# Patient Record
Sex: Female | Born: 1965 | Race: White | Hispanic: No | Marital: Married | Smoking: Never smoker
Health system: Southern US, Community
[De-identification: ages and names within clinical notes are randomized; demographics above are authoritative.]

## PROBLEM LIST (undated history)

## (undated) DIAGNOSIS — K219 Gastro-esophageal reflux disease without esophagitis: Secondary | ICD-10-CM

## (undated) DIAGNOSIS — C801 Malignant (primary) neoplasm, unspecified: Secondary | ICD-10-CM

## (undated) DIAGNOSIS — I10 Essential (primary) hypertension: Secondary | ICD-10-CM

## (undated) DIAGNOSIS — E785 Hyperlipidemia, unspecified: Secondary | ICD-10-CM

## (undated) DIAGNOSIS — Z9889 Other specified postprocedural states: Secondary | ICD-10-CM

## (undated) DIAGNOSIS — F419 Anxiety disorder, unspecified: Secondary | ICD-10-CM

## (undated) DIAGNOSIS — J40 Bronchitis, not specified as acute or chronic: Secondary | ICD-10-CM

## (undated) DIAGNOSIS — R112 Nausea with vomiting, unspecified: Secondary | ICD-10-CM

## (undated) DIAGNOSIS — Z8489 Family history of other specified conditions: Secondary | ICD-10-CM

## (undated) DIAGNOSIS — G4730 Sleep apnea, unspecified: Secondary | ICD-10-CM

## (undated) HISTORY — DX: Hyperlipidemia, unspecified: E78.5

## (undated) HISTORY — DX: Gastro-esophageal reflux disease without esophagitis: K21.9

## (undated) HISTORY — DX: Anxiety disorder, unspecified: F41.9

## (undated) HISTORY — DX: Sleep apnea, unspecified: G47.30

## (undated) HISTORY — DX: Essential (primary) hypertension: I10

## (undated) HISTORY — DX: Other specified postprocedural states: Z98.890

## (undated) HISTORY — DX: Bronchitis, not specified as acute or chronic: J40

---

## 2005-05-03 ENCOUNTER — Other Ambulatory Visit: Payer: Self-pay

## 2005-05-03 ENCOUNTER — Emergency Department: Payer: Self-pay | Admitting: Emergency Medicine

## 2005-05-03 ENCOUNTER — Ambulatory Visit: Payer: Self-pay

## 2006-06-26 ENCOUNTER — Ambulatory Visit: Payer: Self-pay

## 2007-08-26 ENCOUNTER — Ambulatory Visit: Payer: Self-pay | Admitting: Internal Medicine

## 2007-10-27 ENCOUNTER — Ambulatory Visit: Payer: Self-pay

## 2007-11-04 ENCOUNTER — Ambulatory Visit: Payer: Self-pay | Admitting: Gastroenterology

## 2007-11-07 ENCOUNTER — Ambulatory Visit: Payer: Self-pay | Admitting: Gastroenterology

## 2008-09-21 ENCOUNTER — Ambulatory Visit: Payer: Self-pay

## 2009-01-27 ENCOUNTER — Ambulatory Visit: Payer: Self-pay | Admitting: Internal Medicine

## 2009-02-08 ENCOUNTER — Ambulatory Visit: Payer: Self-pay | Admitting: Gastroenterology

## 2009-02-17 ENCOUNTER — Ambulatory Visit: Payer: Self-pay | Admitting: Internal Medicine

## 2009-02-25 ENCOUNTER — Ambulatory Visit: Payer: Self-pay | Admitting: Gastroenterology

## 2009-04-26 ENCOUNTER — Ambulatory Visit: Payer: Self-pay | Admitting: Unknown Physician Specialty

## 2009-05-02 ENCOUNTER — Ambulatory Visit: Payer: Self-pay | Admitting: Internal Medicine

## 2009-12-28 ENCOUNTER — Ambulatory Visit: Payer: Self-pay | Admitting: Internal Medicine

## 2010-02-16 ENCOUNTER — Ambulatory Visit: Payer: Self-pay | Admitting: Gastroenterology

## 2010-02-26 LAB — HM PAP SMEAR: HM Pap smear: NORMAL

## 2011-02-28 ENCOUNTER — Ambulatory Visit: Payer: Self-pay | Admitting: Internal Medicine

## 2011-08-29 ENCOUNTER — Encounter: Payer: Self-pay | Admitting: Internal Medicine

## 2011-08-29 ENCOUNTER — Ambulatory Visit (INDEPENDENT_AMBULATORY_CARE_PROVIDER_SITE_OTHER): Payer: BC Managed Care – PPO | Admitting: Internal Medicine

## 2011-08-29 VITALS — BP 158/92 | HR 84 | Temp 98.4°F | Resp 16 | Ht 61.0 in | Wt 198.2 lb

## 2011-08-29 DIAGNOSIS — R928 Other abnormal and inconclusive findings on diagnostic imaging of breast: Secondary | ICD-10-CM

## 2011-08-29 DIAGNOSIS — J45909 Unspecified asthma, uncomplicated: Secondary | ICD-10-CM

## 2011-08-29 DIAGNOSIS — F419 Anxiety disorder, unspecified: Secondary | ICD-10-CM

## 2011-08-29 DIAGNOSIS — I10 Essential (primary) hypertension: Secondary | ICD-10-CM

## 2011-08-29 DIAGNOSIS — R300 Dysuria: Secondary | ICD-10-CM

## 2011-08-29 DIAGNOSIS — E119 Type 2 diabetes mellitus without complications: Secondary | ICD-10-CM

## 2011-08-29 DIAGNOSIS — N63 Unspecified lump in unspecified breast: Secondary | ICD-10-CM

## 2011-08-29 DIAGNOSIS — E785 Hyperlipidemia, unspecified: Secondary | ICD-10-CM

## 2011-08-29 DIAGNOSIS — F411 Generalized anxiety disorder: Secondary | ICD-10-CM

## 2011-08-29 LAB — CBC WITH DIFFERENTIAL/PLATELET
Basophils Relative: 0.4 % (ref 0.0–3.0)
Eosinophils Absolute: 0.1 10*3/uL (ref 0.0–0.7)
Eosinophils Relative: 1.2 % (ref 0.0–5.0)
Hemoglobin: 11.6 g/dL — ABNORMAL LOW (ref 12.0–15.0)
Lymphocytes Relative: 27.6 % (ref 12.0–46.0)
MCHC: 32.9 g/dL (ref 30.0–36.0)
MCV: 79.7 fl (ref 78.0–100.0)
Monocytes Absolute: 0.9 10*3/uL (ref 0.1–1.0)
Neutro Abs: 5.8 10*3/uL (ref 1.4–7.7)
RBC: 4.41 Mil/uL (ref 3.87–5.11)

## 2011-08-29 LAB — COMPREHENSIVE METABOLIC PANEL
AST: 23 U/L (ref 0–37)
Alkaline Phosphatase: 62 U/L (ref 39–117)
BUN: 15 mg/dL (ref 6–23)
Creatinine, Ser: 0.7 mg/dL (ref 0.4–1.2)

## 2011-08-29 LAB — LDL CHOLESTEROL, DIRECT: Direct LDL: 128.1 mg/dL

## 2011-08-29 LAB — LIPID PANEL
Cholesterol: 196 mg/dL (ref 0–200)
HDL: 35.6 mg/dL — ABNORMAL LOW (ref >39.00)
Triglycerides: 220 mg/dL — ABNORMAL HIGH (ref 0.0–149.0)
VLDL: 44 mg/dL — ABNORMAL HIGH (ref 0.0–40.0)

## 2011-08-29 LAB — POCT URINALYSIS DIPSTICK
Glucose, UA: NEGATIVE
Ketones, UA: NEGATIVE
Spec Grav, UA: 1.01
Urobilinogen, UA: 0.2

## 2011-08-29 MED ORDER — NEBIVOLOL HCL 5 MG PO TABS: 5.0000 mg | ORAL_TABLET | Freq: Every day | ORAL | Status: DC

## 2011-08-29 MED ORDER — ALPRAZOLAM 0.25 MG PO TABS: 0.2500 mg | ORAL_TABLET | Freq: Three times a day (TID) | ORAL | Status: DC | PRN

## 2011-08-29 MED ORDER — FLUTICASONE PROPIONATE HFA 110 MCG/ACT IN AERO: 1.0000 | INHALATION_SPRAY | Freq: Two times a day (BID) | RESPIRATORY_TRACT | Status: DC

## 2011-08-29 NOTE — Progress Notes (Signed)
Subjective:    Patient ID: Jessica Houston, female    DOB: 12-15-1965, 45 y.o.   MRN: [id number]  HPI Jessica Houston is a 45 year old female with a history of diabetes and hyperlipidemia who presents for followup. Her primary concern today is a new breast nodule. She first noticed the nodule in her left breast a few days ago. She noticed dimpling in her lateral left breast. She also notes tenderness in both breasts. She denies any skin changes or discharge from her nipples. She reports a recent mammogram that was normal.  In regards to her diabetes, she brings a record of her fasting blood sugar which show most blood sugars between 100 -130. She occasionally checks a postprandial blood sugar and these are typically between 130 and 150. She denies any low blood sugars. She has been using diet alone to control her blood sugars. She was started on metformin in the past but was not able to tolerate this medication.  She reports full compliance with her Bystolic. This has been used in the past for both blood pressure and for palpitations.  She also reports increased anxiety and depressed mood recently. She reports that her job was cut back to part-time and she is constrained financially.  Outpatient Encounter Prescriptions as of 08/29/2011  Medication Sig Dispense Refill  . ALPRAZolam (XANAX) 0.25 MG tablet Take 1 tablet (0.25 mg total) by mouth 3 (three) times daily as needed for anxiety.  90 tablet  3  . Calcium Carbonate Antacid (TUMS PO) Take 1 tablet by mouth as needed.        . Cholecalciferol (VITAMIN D PO) Take 1 tablet by mouth daily.        . Coenzyme Q10 (COQ10 PO) Take 1 tablet by mouth daily.        . fluticasone (FLONASE) 50 MCG/ACT nasal spray Place 2 sprays into the nose as needed.        . fluticasone (FLOVENT HFA) 110 MCG/ACT inhaler Inhale 1 puff into the lungs 2 (two) times daily.  1 Inhaler  6  . loratadine (CLARITIN) 10 MG tablet Take 10 mg by mouth daily as needed.        .  Multiple Vitamin (MULTIVITAMIN) capsule Take 1 capsule by mouth daily.        . nebivolol (BYSTOLIC) 5 MG tablet Take 1 tablet (5 mg total) by mouth daily.  30 tablet  6  . Omega-3 Fatty Acids (FISH OIL) 1000 MG CAPS Take 1 capsule by mouth daily.        . pantoprazole (PROTONIX) 40 MG tablet Take 1 tablet by mouth Daily.        Review of Systems  Constitutional: Negative for fever, chills, appetite change, fatigue and unexpected weight change.  HENT: Negative for ear pain, congestion, sore throat, trouble swallowing, neck pain, voice change and sinus pressure.   Eyes: Negative for visual disturbance.  Respiratory: Negative for cough, shortness of breath, wheezing and stridor.   Cardiovascular: Negative for chest pain, palpitations and leg swelling.  Gastrointestinal: Negative for nausea, vomiting, abdominal pain, diarrhea, constipation, blood in stool, abdominal distention and anal bleeding.  Genitourinary: Negative for dysuria and flank pain.  Musculoskeletal: Negative for myalgias, arthralgias and gait problem.  Skin: Negative for color change and rash.  Neurological: Negative for dizziness and headaches.  Hematological: Negative for adenopathy. Does not bruise/bleed easily.  Psychiatric/Behavioral: Positive for dysphoric mood. Negative for suicidal ideas and sleep disturbance. The patient is nervous/anxious.    BP 158/92  Pulse 84  Temp(Src) 98.4 F (36.9 C) (Oral)  Resp 16  Ht 5\' 1"  (1.549 m)  Wt 198 lb 4 oz (89.926 kg)  BMI 37.46 kg/m2  SpO2 94%  LMP 08/14/2011     Objective:   Physical Exam  Constitutional: She is oriented to person, place, and time. She appears well-developed and well-nourished. No distress.  HENT:  Head: Normocephalic and atraumatic.  Right Ear: External ear normal.  Left Ear: External ear normal.  Nose: Nose normal.  Mouth/Throat: Oropharynx is clear and moist. No oropharyngeal exudate.  Eyes: Conjunctivae are normal. Pupils are equal, round, and  reactive to light. Right eye exhibits no discharge. Left eye exhibits no discharge. No scleral icterus.  Neck: Normal range of motion. Neck supple. No tracheal deviation present. No thyromegaly present.  Cardiovascular: Normal rate, regular rhythm, normal heart sounds and intact distal pulses.  Exam reveals no gallop and no friction rub.   No murmur heard. Pulmonary/Chest: Effort normal and breath sounds normal. No respiratory distress. She has no wheezes. She has no rales. She exhibits no tenderness. Right breast exhibits tenderness. Right breast exhibits no mass and no skin change. Left breast exhibits mass and tenderness. Left breast exhibits no skin change. Breasts are symmetrical.    Musculoskeletal: Normal range of motion. She exhibits no edema and no tenderness.  Lymphadenopathy:    She has no cervical adenopathy.  Neurological: She is alert and oriented to person, place, and time. No cranial nerve deficit. She exhibits normal muscle tone. Coordination normal.  Skin: Skin is warm and dry. No rash noted. She is not diaphoretic. No erythema. No pallor.  Psychiatric: Her behavior is normal. Judgment and thought content normal. Her mood appears anxious. Cognition and memory are normal. She exhibits a depressed mood.          Assessment & Plan:  1. Breast mass - palpable mass at 3:00 in the left breast. Area is firm but movable. Patient reports recent normal mammogram. We will request records on this. Will get ultrasound of her left breast. We also discussed setting her up with a surgeon for evaluation.  2. Diabetes - patient with diabetes. Based on her record of blood sugar, control appears good. Will check hemoglobin A1c and renal function with labs today. She is not on a statin medication because she has had trouble tolerating these medications in the past secondary to palpitations. She also had difficulty tolerating an ACE inhibitor in the past because of palpitations.  3. Anxiety -  patient is anxious and depressed recently in the setting of financial concerns. Offered support today. We'll continue to use Xanax as needed.  4. Hypertension -blood pressure is elevated today. Patient has not been able to tolerate ACE inhibitor or ARB's in the past because of palpitations. We discussed increasing her Bystolic dose but she doesn't want to do this because of fatigue. Would like to recheck her blood pressure in one to 2 weeks, however given financial constraints she would prefer to delay her next appointment for 3 months. She will continue to monitor on her own and will call if blood pressure running greater than 140/90.  5. Hyperlipidemia - Will check lipids with labs today.

## 2011-08-30 ENCOUNTER — Telehealth: Payer: Self-pay | Admitting: Internal Medicine

## 2011-08-30 MED ORDER — SULFAMETHOXAZOLE-TRIMETHOPRIM 800-160 MG PO TABS: 1.0000 | ORAL_TABLET | Freq: Two times a day (BID) | ORAL | Status: AC

## 2011-08-31 LAB — URINE CULTURE

## 2011-09-03 ENCOUNTER — Other Ambulatory Visit: Payer: Self-pay | Admitting: Internal Medicine

## 2011-09-03 ENCOUNTER — Ambulatory Visit: Payer: Self-pay | Admitting: Internal Medicine

## 2011-09-03 DIAGNOSIS — Z1239 Encounter for other screening for malignant neoplasm of breast: Secondary | ICD-10-CM

## 2011-09-03 NOTE — Telephone Encounter (Signed)
Done

## 2011-09-12 ENCOUNTER — Encounter: Payer: Self-pay | Admitting: Internal Medicine

## 2011-09-13 ENCOUNTER — Telehealth: Payer: Self-pay | Admitting: *Deleted

## 2011-09-13 NOTE — Telephone Encounter (Signed)
Message copied by Vernie Murders on Thu Sep 13, 2011  3:52 PM ------      Message from: Ronna Polio A      Created: Wed Sep 05, 2011  8:04 AM       The mammogram showed no abnormalities and the radiologist could no longer palpate the nodule. We should bring her back in for repeat exam. If I can still palpate, will set up with surgery.

## 2011-09-14 NOTE — Telephone Encounter (Signed)
Informed patient the mammogram showed no abnormalities and the radiologist could no longer palpate nodule and we want to bring her back in for repeat exam and if she can still palpate the nodule she wants to refer you to surgery.  She stated that she will call and make an appointment in November as soon as she looks at her schedule.

## 2011-09-18 ENCOUNTER — Telehealth: Payer: Self-pay | Admitting: Internal Medicine

## 2011-09-18 MED ORDER — AMOXICILLIN-POT CLAVULANATE 875-125 MG PO TABS: 1.0000 | ORAL_TABLET | Freq: Two times a day (BID) | ORAL | Status: DC

## 2011-09-18 NOTE — Telephone Encounter (Signed)
Rx for augmentin sent to pharmacy

## 2011-09-18 NOTE — Telephone Encounter (Signed)
The [place] [employer] Board requires [place] to see a patient prior to prescribing new meds. I would really prefer to see her before writing for antibiotics. However,given that she was just seen 10/10 and has history of recurrent sinus infections, I think it is reasonable to try a course of an antibiotic on this occasion.  We can call in Augmentin 875mg  bid x10 days.  If no improvement, she must be seen.

## 2011-09-18 NOTE — Telephone Encounter (Signed)
We can see her at 11:00am today.

## 2011-09-18 NOTE — Telephone Encounter (Signed)
Patient called back and I advised her that Dr. Dan Humphreys wanted to see her tomorrow at 64 however she isn't off tomorrow.  She really wanted to know if we could call her in something, she can't afford to come in.  She states that she gets this sinus stuff 2 times a year and she is stopped up on one side and has had headache since Sunday off and on.  She states it is hard for her to get anything out but when she does it is green.  She does clear her throat several times a day and trouble sleeping.  She states under her eyes ache.  She states she was just here the first part of October.   Please advise she does use [facility] in [place].

## 2011-09-18 NOTE — Telephone Encounter (Signed)
I have called patient and advised that Augmentin was going to be called in and she states she understands.  She was very thankful for the prescription sent in.  She will be here on the 14th of November for sure and if she doesn't get any better she will come in before then.

## 2011-09-26 ENCOUNTER — Encounter: Payer: Self-pay | Admitting: Internal Medicine

## 2011-10-03 ENCOUNTER — Ambulatory Visit (INDEPENDENT_AMBULATORY_CARE_PROVIDER_SITE_OTHER): Payer: BC Managed Care – PPO | Admitting: Internal Medicine

## 2011-10-03 ENCOUNTER — Encounter: Payer: Self-pay | Admitting: Internal Medicine

## 2011-10-03 DIAGNOSIS — E785 Hyperlipidemia, unspecified: Secondary | ICD-10-CM

## 2011-10-03 DIAGNOSIS — E119 Type 2 diabetes mellitus without complications: Secondary | ICD-10-CM

## 2011-10-03 DIAGNOSIS — Z23 Encounter for immunization: Secondary | ICD-10-CM

## 2011-10-03 DIAGNOSIS — N6320 Unspecified lump in the left breast, unspecified quadrant: Secondary | ICD-10-CM

## 2011-10-03 DIAGNOSIS — N63 Unspecified lump in unspecified breast: Secondary | ICD-10-CM

## 2011-10-03 MED ORDER — METFORMIN HCL 500 MG PO TABS: 500.0000 mg | ORAL_TABLET | Freq: Two times a day (BID) | ORAL | Status: DC

## 2011-10-03 NOTE — Progress Notes (Signed)
Subjective:    Patient ID: Jessica Houston, female    DOB: October 27, 1966, 45 y.o.   MRN: [id number]  HPI 45 year old female with a history of diabetes and hyperlipidemia presents for followup. In regards to her diabetes her recent hemoglobin A1c was elevated at 7.1%. In the past she has been unable to tolerate medications including metformin because of sensitivity and palpitations. She has been working on diet and brings record of her blood sugars today which are typically between 100 -130 fasting. She is interested in retrying medication today to help control her blood sugars.  In regards to her high cholesterol, she has tried medications including Crestor in the past. She had some myalgia while taking Crestor so stopped this medication. However, she continues to have myalgia despite stopping medication. She is interested in Doctor, hospital.  In regards to her recent left breast mass, ultrasound and mammogram were normal. The technician to perform the ultrasound was unable to feel the mass and the patient is now unable to feel the mass. She denies any new masses, skin changes, or discharge from her breast.  Outpatient Encounter Prescriptions as of 10/03/2011  Medication Sig Dispense Refill  . ALPRAZolam (XANAX) 0.25 MG tablet Take 1 tablet (0.25 mg total) by mouth 3 (three) times daily as needed for anxiety.  90 tablet  3  . Calcium Carbonate Antacid (TUMS PO) Take 1 tablet by mouth as needed.        . Cholecalciferol (VITAMIN D PO) Take 1 tablet by mouth daily.        . Coenzyme Q10 (COQ10 PO) Take 1 tablet by mouth daily.        . fluticasone (FLONASE) 50 MCG/ACT nasal spray Place 2 sprays into the nose as needed.        . fluticasone (FLOVENT HFA) 110 MCG/ACT inhaler Inhale 1 puff into the lungs 2 (two) times daily.  1 Inhaler  6  . loratadine (CLARITIN) 10 MG tablet Take 10 mg by mouth daily as needed.        . Multiple Vitamin (MULTIVITAMIN) capsule Take 1 capsule by mouth daily.        .  nebivolol (BYSTOLIC) 5 MG tablet Take 1 tablet (5 mg total) by mouth daily.  30 tablet  6  . Omega-3 Fatty Acids (FISH OIL) 1000 MG CAPS Take 1 capsule by mouth daily.        . pantoprazole (PROTONIX) 40 MG tablet Take 1 tablet by mouth Daily.        Review of Systems  Constitutional: Negative for fever, chills, appetite change, fatigue and unexpected weight change.  HENT: Positive for voice change. Negative for ear pain, congestion, neck pain and sinus pressure.   Eyes: Negative for visual disturbance.  Respiratory: Negative for cough, shortness of breath, wheezing and stridor.   Cardiovascular: Negative for chest pain, palpitations and leg swelling.  Gastrointestinal: Negative for nausea, vomiting, abdominal pain, diarrhea, constipation and abdominal distention.  Genitourinary: Negative for dysuria and flank pain.  Musculoskeletal: Negative for myalgias, arthralgias and gait problem.  Skin: Negative for color change and rash.  Neurological: Negative for dizziness and headaches.  Hematological: Negative for adenopathy. Does not bruise/bleed easily.  Psychiatric/Behavioral: Negative for suicidal ideas, sleep disturbance and dysphoric mood. The patient is not nervous/anxious.    BP 132/78  Pulse 81  Temp(Src) 98.2 F (36.8 C) (Oral)  Wt 195 lb (88.451 kg)  SpO2 97%     Objective:   Physical Exam  Constitutional: She is  oriented to person, place, and time. She appears well-developed and well-nourished. No distress.  HENT:  Head: Normocephalic and atraumatic.  Right Ear: External ear normal.  Left Ear: External ear normal.  Nose: Nose normal.  Mouth/Throat: Oropharynx is clear and moist. No oropharyngeal exudate.  Eyes: Conjunctivae are normal. Pupils are equal, round, and reactive to light. Right eye exhibits no discharge. Left eye exhibits no discharge. No scleral icterus.  Neck: Normal range of motion. Neck supple. No tracheal deviation present. No thyromegaly present.    Cardiovascular: Normal rate, regular rhythm, normal heart sounds and intact distal pulses.  Exam reveals no gallop and no friction rub.   No murmur heard. Pulmonary/Chest: Effort normal and breath sounds normal. No respiratory distress. She has no wheezes. She has no rales. She exhibits no tenderness. Left breast exhibits no inverted nipple, no mass, no nipple discharge, no skin change and no tenderness.  Musculoskeletal: Normal range of motion. She exhibits no edema and no tenderness.  Lymphadenopathy:    She has no cervical adenopathy.  Neurological: She is alert and oriented to person, place, and time. No cranial nerve deficit. She exhibits normal muscle tone. Coordination normal.  Skin: Skin is warm and dry. No rash noted. She is not diaphoretic. No erythema. No pallor.  Psychiatric: She has a normal mood and affect. Her behavior is normal. Judgment and thought content normal.          Assessment & Plan:  1. Diabetes mellitus - A1c increased at 7.1% Will add metformin. Pt has had difficulty tolerating new medications in the past, so will titrate up slowly. Pt will record BG and bring to next visit 2 months.  2. Breast mass - Resolved. [place] and mammogram were normal. Suspect may have been a fibrous cyst, as no longer palpable. Will continue to monitor.  3. Hyperlipidemia - Noted on recent labs. Pt has been unable to tolerate statins in the past because of myalgia, but continues to have myalgia even off statin.  Will consider trying crestor, but will wait until next visit, as starting Metformin today.  Follow up 2 months.

## 2011-10-04 ENCOUNTER — Telehealth: Payer: Self-pay | Admitting: Internal Medicine

## 2011-10-04 NOTE — Telephone Encounter (Signed)
Pt wants to know if we have samples of bystolic  She takes 5mg   Her heart dr gives her 10mg  and she cuts them in to She would like to get enough to get her though till jan Please call

## 2011-10-05 NOTE — Telephone Encounter (Signed)
Done. Pt informed.

## 2011-10-09 ENCOUNTER — Other Ambulatory Visit: Payer: Self-pay | Admitting: *Deleted

## 2011-10-09 MED ORDER — GLUCOSE BLOOD VI STRP: ORAL_STRIP | Status: DC

## 2011-10-16 ENCOUNTER — Other Ambulatory Visit: Payer: Self-pay | Admitting: *Deleted

## 2011-10-16 DIAGNOSIS — N63 Unspecified lump in unspecified breast: Secondary | ICD-10-CM

## 2011-10-16 DIAGNOSIS — R300 Dysuria: Secondary | ICD-10-CM

## 2011-10-16 DIAGNOSIS — E785 Hyperlipidemia, unspecified: Secondary | ICD-10-CM

## 2011-10-16 DIAGNOSIS — I10 Essential (primary) hypertension: Secondary | ICD-10-CM

## 2011-10-16 DIAGNOSIS — F419 Anxiety disorder, unspecified: Secondary | ICD-10-CM

## 2011-10-16 MED ORDER — PANTOPRAZOLE SODIUM 40 MG PO TBEC: 40.0000 mg | DELAYED_RELEASE_TABLET | Freq: Every day | ORAL | Status: DC

## 2011-10-23 ENCOUNTER — Telehealth: Payer: Self-pay | Admitting: Internal Medicine

## 2011-10-23 DIAGNOSIS — I10 Essential (primary) hypertension: Secondary | ICD-10-CM

## 2011-10-23 DIAGNOSIS — F419 Anxiety disorder, unspecified: Secondary | ICD-10-CM

## 2011-10-23 DIAGNOSIS — N63 Unspecified lump in unspecified breast: Secondary | ICD-10-CM

## 2011-10-23 DIAGNOSIS — R300 Dysuria: Secondary | ICD-10-CM

## 2011-10-23 DIAGNOSIS — E785 Hyperlipidemia, unspecified: Secondary | ICD-10-CM

## 2011-10-23 MED ORDER — NEBIVOLOL HCL 5 MG PO TABS: 5.0000 mg | ORAL_TABLET | Freq: Every day | ORAL | Status: DC

## 2011-10-23 NOTE — Telephone Encounter (Signed)
[phone] Pt called to see if we have any samples of bystolic  5mg .  If you have 10mg  pt stated she could cut in half.

## 2011-10-23 NOTE — Telephone Encounter (Signed)
Done,  Patient informed

## 2011-10-29 ENCOUNTER — Telehealth: Payer: Self-pay | Admitting: Internal Medicine

## 2011-10-29 NOTE — Telephone Encounter (Signed)
[phone] Pt called she needs rx for stip for her glucose   Pt is complete out of test strips [facility] Please call when rx is called in

## 2011-10-30 MED ORDER — GLUCOSE BLOOD VI STRP: 1.0000 | ORAL_STRIP | Freq: Two times a day (BID) | Status: DC | PRN

## 2011-10-30 MED ORDER — ONETOUCH ULTRASOFT LANCETS MISC: 1.0000 | Freq: Two times a day (BID) | Status: DC

## 2011-10-30 NOTE — Telephone Encounter (Signed)
Left mess to call office back. , need name of brand glucometer/strips she needs RX for and how frequent she is testing her blood sugar.

## 2011-10-30 NOTE — Telephone Encounter (Signed)
Spoke w/pt - rx's sent in

## 2011-11-23 ENCOUNTER — Telehealth: Payer: Self-pay | Admitting: Internal Medicine

## 2011-11-23 DIAGNOSIS — E785 Hyperlipidemia, unspecified: Secondary | ICD-10-CM

## 2011-11-23 DIAGNOSIS — N63 Unspecified lump in unspecified breast: Secondary | ICD-10-CM

## 2011-11-23 DIAGNOSIS — I10 Essential (primary) hypertension: Secondary | ICD-10-CM

## 2011-11-23 DIAGNOSIS — R300 Dysuria: Secondary | ICD-10-CM

## 2011-11-23 DIAGNOSIS — F419 Anxiety disorder, unspecified: Secondary | ICD-10-CM

## 2011-11-23 MED ORDER — PANTOPRAZOLE SODIUM 40 MG PO TBEC: 40.0000 mg | DELAYED_RELEASE_TABLET | Freq: Every day | ORAL | Status: DC

## 2011-11-23 NOTE — Telephone Encounter (Signed)
Done

## 2011-11-23 NOTE — Telephone Encounter (Signed)
Patient needing a refill on her Protonix 40 mg . She is completley out.

## 2011-11-26 ENCOUNTER — Telehealth: Payer: Self-pay | Admitting: *Deleted

## 2011-11-26 DIAGNOSIS — R300 Dysuria: Secondary | ICD-10-CM

## 2011-11-26 DIAGNOSIS — E785 Hyperlipidemia, unspecified: Secondary | ICD-10-CM

## 2011-11-26 DIAGNOSIS — F419 Anxiety disorder, unspecified: Secondary | ICD-10-CM

## 2011-11-26 DIAGNOSIS — I10 Essential (primary) hypertension: Secondary | ICD-10-CM

## 2011-11-26 DIAGNOSIS — N63 Unspecified lump in unspecified breast: Secondary | ICD-10-CM

## 2011-11-26 MED ORDER — PANTOPRAZOLE SODIUM 40 MG PO TBEC: 40.0000 mg | DELAYED_RELEASE_TABLET | Freq: Every day | ORAL | Status: DC

## 2011-11-26 NOTE — Telephone Encounter (Signed)
Pt needs PA on protonix. Per pt, she has tried zantac, pepcid and omeprazole which were all sub-theraputic. [address] insurance, [place] # [phone], ID # [id number]. Med approved over the phone, 11/05/11 thru 12/05/12. I left detailed VM for pt. New RX sent into pharm w/note

## 2011-11-27 ENCOUNTER — Telehealth: Payer: Self-pay | Admitting: *Deleted

## 2011-11-27 DIAGNOSIS — I10 Essential (primary) hypertension: Secondary | ICD-10-CM

## 2011-11-27 DIAGNOSIS — E785 Hyperlipidemia, unspecified: Secondary | ICD-10-CM

## 2011-11-27 DIAGNOSIS — F419 Anxiety disorder, unspecified: Secondary | ICD-10-CM

## 2011-11-27 DIAGNOSIS — R300 Dysuria: Secondary | ICD-10-CM

## 2011-11-27 DIAGNOSIS — N63 Unspecified lump in unspecified breast: Secondary | ICD-10-CM

## 2011-11-27 MED ORDER — PANTOPRAZOLE SODIUM 40 MG PO TBEC: 40.0000 mg | DELAYED_RELEASE_TABLET | Freq: Every day | ORAL | Status: DC

## 2011-11-27 NOTE — Telephone Encounter (Signed)
Patient called stating that PA for protonix was not approved. See previous phone note, I completed this yesterday and was given verbal auth. Pharm called insurance and was told that the PA was approved for pt's husband. I called insurance - PA was NOT approved. I ABSOLUTELY did not do PA on pt's husband yesterday, it WAS for Carlus Pavlov w/pt identifiers verbally repeated back to me by [facility] rep.   30 min on the phone, med approved x 1 year. Patient informed

## 2011-12-03 ENCOUNTER — Other Ambulatory Visit: Payer: BC Managed Care – PPO

## 2011-12-04 ENCOUNTER — Other Ambulatory Visit (INDEPENDENT_AMBULATORY_CARE_PROVIDER_SITE_OTHER): Payer: BC Managed Care – PPO | Admitting: *Deleted

## 2011-12-04 DIAGNOSIS — E785 Hyperlipidemia, unspecified: Secondary | ICD-10-CM

## 2011-12-04 DIAGNOSIS — E119 Type 2 diabetes mellitus without complications: Secondary | ICD-10-CM

## 2011-12-04 LAB — COMPREHENSIVE METABOLIC PANEL
ALT: 21 U/L (ref 0–35)
AST: 20 U/L (ref 0–37)
Alkaline Phosphatase: 54 U/L (ref 39–117)
BUN: 17 mg/dL (ref 6–23)
Creatinine, Ser: 0.8 mg/dL (ref 0.4–1.2)
Potassium: 4.3 mEq/L (ref 3.5–5.1)

## 2011-12-04 LAB — LDL CHOLESTEROL, DIRECT: Direct LDL: 133 mg/dL

## 2011-12-04 LAB — LIPID PANEL
HDL: 37.5 mg/dL — ABNORMAL LOW (ref >39.00)
Total CHOL/HDL Ratio: 5
Triglycerides: 162 mg/dL — ABNORMAL HIGH (ref 0.0–149.0)
VLDL: 32.4 mg/dL (ref 0.0–40.0)

## 2011-12-04 LAB — HEMOGLOBIN A1C: Hgb A1c MFr Bld: 6.8 % — ABNORMAL HIGH (ref 4.6–6.5)

## 2011-12-06 ENCOUNTER — Encounter: Payer: Self-pay | Admitting: Internal Medicine

## 2011-12-06 ENCOUNTER — Ambulatory Visit (INDEPENDENT_AMBULATORY_CARE_PROVIDER_SITE_OTHER): Payer: BC Managed Care – PPO | Admitting: Internal Medicine

## 2011-12-06 VITALS — BP 136/78 | HR 77 | Temp 98.5°F | Ht 61.5 in | Wt 194.0 lb

## 2011-12-06 DIAGNOSIS — L259 Unspecified contact dermatitis, unspecified cause: Secondary | ICD-10-CM

## 2011-12-06 DIAGNOSIS — E785 Hyperlipidemia, unspecified: Secondary | ICD-10-CM | POA: Insufficient documentation

## 2011-12-06 DIAGNOSIS — I10 Essential (primary) hypertension: Secondary | ICD-10-CM

## 2011-12-06 DIAGNOSIS — J320 Chronic maxillary sinusitis: Secondary | ICD-10-CM

## 2011-12-06 DIAGNOSIS — J0100 Acute maxillary sinusitis, unspecified: Secondary | ICD-10-CM

## 2011-12-06 DIAGNOSIS — L309 Dermatitis, unspecified: Secondary | ICD-10-CM

## 2011-12-06 DIAGNOSIS — E119 Type 2 diabetes mellitus without complications: Secondary | ICD-10-CM

## 2011-12-06 MED ORDER — CLOTRIMAZOLE-BETAMETHASONE 1-0.05 % EX CREA: TOPICAL_CREAM | Freq: Two times a day (BID) | CUTANEOUS | Status: DC

## 2011-12-06 MED ORDER — AMOXICILLIN-POT CLAVULANATE 875-125 MG PO TABS: 1.0000 | ORAL_TABLET | Freq: Two times a day (BID) | ORAL | Status: DC

## 2011-12-06 MED ORDER — ROSUVASTATIN CALCIUM 5 MG PO TABS: 5.0000 mg | ORAL_TABLET | Freq: Every day | ORAL | Status: DC

## 2011-12-06 NOTE — Assessment & Plan Note (Signed)
Symptoms most consistent with acute maxillary sinusitis. Will plan to treat with Augmentin x10 days. Patient will call if symptoms are not improving. She will continue to use ibuprofen as needed for pain.

## 2011-12-06 NOTE — Assessment & Plan Note (Signed)
Patient with hyperlipidemia. LDL typically runs between 125 and 135. Goal LDL<70. She has not been able to tolerate statin medications in the past because of myalgia. She is interested in trying Crestor. Will try Crestor 5 mg every other day. She will call if any myalgia develops. She will followup with repeat lipids and LFTs in 3 months.

## 2011-12-06 NOTE — Assessment & Plan Note (Signed)
Recent hemoglobin A1c improved at 6.8%. Encouraged patient to continue efforts at improvement dietary help control blood sugars. We'll continue metformin. Patient will have repeat hemoglobin A1c in 3 months.

## 2011-12-06 NOTE — Progress Notes (Signed)
Subjective:    Patient ID: Jessica Houston, female    DOB: 10-Nov-1966, 46 y.o.   MRN: [id number]  HPI 46 year old female with a history of diabetes, anxiety, hypertension, and hyperlipidemia presents for followup. In regards to her diabetes, she brings record of her blood sugars which show most blood sugars fasting between 101 40. She has not had any elevated blood sugars greater than 200. She has not had any low blood sugars less than 60. She reports that she has been making effort at diet and exercise with goal of controlling her blood sugar. Her recent hemoglobin A1c was 6.8% which was improved.  In regards to her hypertension, she did not bring a record of her blood pressure today. She reports full compliance with her Bystolic. She denies any headache, chest pain, or shortness of breath.  In regards to hyperlipidemia, she has been unable to tolerate statin medications in the past. Her recent LDL cholesterol was elevated above goal at 128. Today we discussed goal of less than 70. She is interested in Doctor, hospital.  She is also concerned today about a several week history of left maxillary sinus pain. She reports nasal congestion that is purulent. She denies any fever or chills. She denies any cough. She has been using over-the-counter cough and cold medications with no improvement.  Outpatient Encounter Prescriptions as of 12/06/2011  Medication Sig Dispense Refill  . ALPRAZolam (XANAX) 0.25 MG tablet Take 1 tablet (0.25 mg total) by mouth 3 (three) times daily as needed for anxiety.  90 tablet  3  . amoxicillin-clavulanate (AUGMENTIN) 875-125 MG per tablet Take 1 tablet by mouth 2 (two) times daily.  20 tablet  0  . Calcium Carbonate Antacid (TUMS PO) Take 1 tablet by mouth as needed.        . Cholecalciferol (VITAMIN D PO) Take 1 tablet by mouth daily.        . Coenzyme Q10 (COQ10 PO) Take 1 tablet by mouth daily.        . fluticasone (FLONASE) 50 MCG/ACT nasal spray Place 2 sprays into  the nose as needed.        . fluticasone (FLOVENT HFA) 110 MCG/ACT inhaler Inhale 1 puff into the lungs 2 (two) times daily.  1 Inhaler  6  . glucose blood (ONE TOUCH TEST STRIPS) test strip 1 each by Other route 2 (two) times daily as needed. DISPENSE STRIPS FOR NANO METER DX: 250.00  150 each  3  . Lancets (ONETOUCH ULTRASOFT) lancets 1 each by Other route 2 (two) times daily. Dispense one touch nano lancets  150 each  3  . loratadine (CLARITIN) 10 MG tablet Take 10 mg by mouth daily as needed.        . metFORMIN (GLUCOPHAGE) 500 MG tablet Take 500 mg by mouth 2 (two) times daily with a meal.      . Multiple Vitamin (MULTIVITAMIN) capsule Take 1 capsule by mouth daily.        . nebivolol (BYSTOLIC) 5 MG tablet Take 1 tablet (5 mg total) by mouth daily.  30 tablet  0  . Omega-3 Fatty Acids (FISH OIL) 1000 MG CAPS Take 1 capsule by mouth daily.        . pantoprazole (PROTONIX) 40 MG tablet Take 1 tablet (40 mg total) by mouth daily.  90 tablet  1  . clotrimazole-betamethasone (LOTRISONE) cream Apply topically 2 (two) times daily.  30 g  0  . rosuvastatin (CRESTOR) 5 MG tablet Take 1 tablet (  5 mg total) by mouth daily.  90 tablet  3    Review of Systems  Constitutional: Negative for fever, chills, appetite change, fatigue and unexpected weight change.  HENT: Positive for ear pain, congestion and sinus pressure. Negative for sore throat, trouble swallowing, neck pain and voice change.   Eyes: Negative for visual disturbance.  Respiratory: Negative for cough, shortness of breath, wheezing and stridor.   Cardiovascular: Negative for chest pain, palpitations and leg swelling.  Gastrointestinal: Negative for nausea, vomiting, abdominal pain, diarrhea, constipation, blood in stool, abdominal distention and anal bleeding.  Genitourinary: Negative for dysuria and flank pain.  Musculoskeletal: Negative for myalgias, arthralgias and gait problem.  Skin: Negative for color change and rash.  Neurological:  Negative for dizziness and headaches.  Hematological: Negative for adenopathy. Does not bruise/bleed easily.  Psychiatric/Behavioral: Negative for suicidal ideas, sleep disturbance and dysphoric mood. The patient is not nervous/anxious.    BP 136/78  Pulse 77  Temp(Src) 98.5 F (36.9 C) (Oral)  Ht 5' 1.5" (1.562 m)  Wt 194 lb (87.998 kg)  BMI 36.06 kg/m2  SpO2 97%     Objective:   Physical Exam  Constitutional: She is oriented to person, place, and time. She appears well-developed and well-nourished. No distress.  HENT:  Head: Normocephalic and atraumatic.  Right Ear: External ear normal.  Left Ear: External ear normal.  Nose: Mucosal edema present. Right sinus exhibits frontal sinus tenderness. Left sinus exhibits frontal sinus tenderness.  Mouth/Throat: Oropharynx is clear and moist. No oropharyngeal exudate.  Eyes: Conjunctivae are normal. Pupils are equal, round, and reactive to light. Right eye exhibits no discharge. Left eye exhibits no discharge. No scleral icterus.  Neck: Normal range of motion. Neck supple. No tracheal deviation present. No thyromegaly present.  Cardiovascular: Normal rate, regular rhythm, normal heart sounds and intact distal pulses.  Exam reveals no gallop and no friction rub.   No murmur heard. Pulmonary/Chest: Effort normal and breath sounds normal. No respiratory distress. She has no wheezes. She has no rales. She exhibits no tenderness.  Musculoskeletal: Normal range of motion. She exhibits no edema and no tenderness.  Lymphadenopathy:    She has no cervical adenopathy.  Neurological: She is alert and oriented to person, place, and time. No cranial nerve deficit. She exhibits normal muscle tone. Coordination normal.  Skin: Skin is warm and dry. No rash noted. She is not diaphoretic. No erythema. No pallor.  Psychiatric: She has a normal mood and affect. Her behavior is normal. Judgment and thought content normal.          Assessment & Plan:

## 2011-12-06 NOTE — Assessment & Plan Note (Deleted)
Patient with hyperlipidemia. LDL typically runs between 125 and 135. Goal LDL<70. She has not been able to tolerate statin medications in the past because of myalgia. She is interested in trying Crestor. Will try Crestor 5 mg every other day. She will call if any myalgia develops. She will followup with repeat lipids and LFTs in 3 months. 

## 2011-12-06 NOTE — Assessment & Plan Note (Signed)
Blood pressure well-controlled today. Recent renal function was normal. Patient has not been able to tolerate ACE inhibitor. She currently uses Bystolic as this helps both with blood pressure and her palpitations. We'll plan to continue this medication. She'll followup in 3 months.

## 2012-01-04 ENCOUNTER — Telehealth: Payer: Self-pay | Admitting: Internal Medicine

## 2012-01-04 NOTE — Telephone Encounter (Signed)
Per Carlyon Shadow. I put samples up front of Bystolic 5 mg we gave her 5 samples with 7 tablets in each.

## 2012-02-04 ENCOUNTER — Ambulatory Visit: Payer: BC Managed Care – PPO | Admitting: Internal Medicine

## 2012-03-03 ENCOUNTER — Other Ambulatory Visit (INDEPENDENT_AMBULATORY_CARE_PROVIDER_SITE_OTHER): Payer: BC Managed Care – PPO | Admitting: *Deleted

## 2012-03-03 DIAGNOSIS — E785 Hyperlipidemia, unspecified: Secondary | ICD-10-CM

## 2012-03-03 DIAGNOSIS — E119 Type 2 diabetes mellitus without complications: Secondary | ICD-10-CM

## 2012-03-03 LAB — LIPID PANEL
HDL: 30.6 mg/dL — ABNORMAL LOW (ref >39.00)
Total CHOL/HDL Ratio: 6
VLDL: 43 mg/dL — ABNORMAL HIGH (ref 0.0–40.0)

## 2012-03-03 LAB — COMPREHENSIVE METABOLIC PANEL
ALT: 23 U/L (ref 0–35)
AST: 19 U/L (ref 0–37)
Alkaline Phosphatase: 59 U/L (ref 39–117)
Glucose, Bld: 96 mg/dL (ref 70–99)
Potassium: 4.4 mEq/L (ref 3.5–5.1)
Sodium: 138 mEq/L (ref 135–145)
Total Bilirubin: 0.2 mg/dL — ABNORMAL LOW (ref 0.3–1.2)
Total Protein: 7.3 g/dL (ref 6.0–8.3)

## 2012-03-03 LAB — HEMOGLOBIN A1C: Hgb A1c MFr Bld: 6.9 % — ABNORMAL HIGH (ref 4.6–6.5)

## 2012-03-04 ENCOUNTER — Other Ambulatory Visit: Payer: BC Managed Care – PPO

## 2012-03-06 ENCOUNTER — Encounter: Payer: Self-pay | Admitting: Internal Medicine

## 2012-03-06 ENCOUNTER — Ambulatory Visit (INDEPENDENT_AMBULATORY_CARE_PROVIDER_SITE_OTHER): Payer: BC Managed Care – PPO | Admitting: Internal Medicine

## 2012-03-06 DIAGNOSIS — I10 Essential (primary) hypertension: Secondary | ICD-10-CM

## 2012-03-06 DIAGNOSIS — E785 Hyperlipidemia, unspecified: Secondary | ICD-10-CM

## 2012-03-06 DIAGNOSIS — R350 Frequency of micturition: Secondary | ICD-10-CM

## 2012-03-06 DIAGNOSIS — E119 Type 2 diabetes mellitus without complications: Secondary | ICD-10-CM

## 2012-03-06 LAB — POCT URINALYSIS DIPSTICK
Ketones, UA: NEGATIVE
Protein, UA: NEGATIVE
Urobilinogen, UA: 0.2
pH, UA: 6

## 2012-03-06 MED ORDER — METFORMIN HCL 500 MG PO TABS: 500.0000 mg | ORAL_TABLET | Freq: Two times a day (BID) | ORAL | Status: DC

## 2012-03-06 MED ORDER — SULFAMETHOXAZOLE-TMP DS 800-160 MG PO TABS: 1.0000 | ORAL_TABLET | Freq: Two times a day (BID) | ORAL | Status: AC

## 2012-03-06 NOTE — Assessment & Plan Note (Signed)
Blood pressure well-controlled on Bystolic. We'll plan to continue. Patient was not able to tolerate ACE inhibitor because of hypotension. Recent renal function is normal. Followup 3 months.

## 2012-03-06 NOTE — Assessment & Plan Note (Signed)
Encouraged compliance with Crestor. Will plan to recheck lipids in 3 months. Goal LDL less than 70.

## 2012-03-06 NOTE — Progress Notes (Signed)
Subjective:    Patient ID: Jessica Houston, female    DOB: 07-Oct-1966, 46 y.o.   MRN: [id number]  HPI 46 year old female with history of diabetes, hypertension, hyperlipidemia presents for followup. In regards to her diabetes, she reports her blood sugars have been fairly well controlled, typically near 130 fasting. Recent A1c was 6.9%. She reports full compliance with her medication.  Regards to her hyperlipidemia, she reports that she never started her Crestor. She reports that she had difficulty remembering this medication. She has not made changes to her diet.  She is concerned today about several week history of increased urinary frequency. She denies any fever, chills, flank pain. She occasionally has mild dysuria. She denies any hematuria.  Outpatient Encounter Prescriptions as of 03/06/2012  Medication Sig Dispense Refill  . ALPRAZolam (XANAX) 0.25 MG tablet Take 1 tablet (0.25 mg total) by mouth 3 (three) times daily as needed for anxiety.  90 tablet  3  . Calcium Carbonate Antacid (TUMS PO) Take 1 tablet by mouth as needed.        . Cholecalciferol (VITAMIN D PO) Take 1 tablet by mouth daily.        . clotrimazole-betamethasone (LOTRISONE) cream Apply topically 2 (two) times daily.  30 g  0  . Coenzyme Q10 (COQ10 PO) Take 1 tablet by mouth daily.        . fluticasone (FLONASE) 50 MCG/ACT nasal spray Place 2 sprays into the nose as needed.        . fluticasone (FLOVENT HFA) 110 MCG/ACT inhaler Inhale 1 puff into the lungs 2 (two) times daily.  1 Inhaler  6  . glucose blood (ONE TOUCH TEST STRIPS) test strip 1 each by Other route 2 (two) times daily as needed. DISPENSE STRIPS FOR NANO METER DX: 250.00  150 each  3  . Lancets (ONETOUCH ULTRASOFT) lancets 1 each by Other route 2 (two) times daily. Dispense one touch nano lancets  150 each  3  . loratadine (CLARITIN) 10 MG tablet Take 10 mg by mouth daily as needed.        . metFORMIN (GLUCOPHAGE) 500 MG tablet Take 1 tablet (500 mg total)  by mouth 2 (two) times daily with a meal.  60 tablet  3  . Multiple Vitamin (MULTIVITAMIN) capsule Take 1 capsule by mouth daily.        . nebivolol (BYSTOLIC) 5 MG tablet Take 1 tablet (5 mg total) by mouth daily.  30 tablet  0  . Omega-3 Fatty Acids (FISH OIL) 1000 MG CAPS Take 1 capsule by mouth daily.        . pantoprazole (PROTONIX) 40 MG tablet Take 1 tablet (40 mg total) by mouth daily.  90 tablet  1  . rosuvastatin (CRESTOR) 5 MG tablet Take 1 tablet (5 mg total) by mouth daily.  90 tablet  3  . sulfamethoxazole-trimethoprim (BACTRIM DS) 800-160 MG per tablet Take 1 tablet by mouth 2 (two) times daily.  14 tablet  0    Review of Systems  Constitutional: Negative for fever, chills, appetite change, fatigue and unexpected weight change.  HENT: Negative for ear pain, congestion, sore throat, trouble swallowing, neck pain, voice change and sinus pressure.   Eyes: Negative for visual disturbance.  Respiratory: Negative for cough, shortness of breath, wheezing and stridor.   Cardiovascular: Negative for chest pain, palpitations and leg swelling.  Gastrointestinal: Negative for nausea, vomiting, abdominal pain, diarrhea, constipation, blood in stool, abdominal distention and anal bleeding.  Genitourinary:  Positive for dysuria, urgency and frequency. Negative for hematuria, flank pain and pelvic pain.  Musculoskeletal: Negative for myalgias, arthralgias and gait problem.  Skin: Negative for color change and rash.  Neurological: Negative for dizziness and headaches.  Hematological: Negative for adenopathy. Does not bruise/bleed easily.  Psychiatric/Behavioral: Negative for suicidal ideas, sleep disturbance and dysphoric mood. The patient is not nervous/anxious.    BP 142/80  Pulse 80  Temp(Src) 98.7 F (37.1 C) (Oral)  Wt 194 lb 4 oz (88.111 kg)  SpO2 99%  LMP 02/28/2012     Objective:   Physical Exam  Constitutional: She is oriented to person, place, and time. She appears  well-developed and well-nourished. No distress.  HENT:  Head: Normocephalic and atraumatic.  Right Ear: External ear normal.  Left Ear: External ear normal.  Nose: Nose normal.  Mouth/Throat: Oropharynx is clear and moist. No oropharyngeal exudate.  Eyes: Conjunctivae are normal. Pupils are equal, round, and reactive to light. Right eye exhibits no discharge. Left eye exhibits no discharge. No scleral icterus.  Neck: Normal range of motion. Neck supple. No tracheal deviation present. No thyromegaly present.  Cardiovascular: Normal rate, regular rhythm, normal heart sounds and intact distal pulses.  Exam reveals no gallop and no friction rub.   No murmur heard. Pulmonary/Chest: Effort normal and breath sounds normal. No respiratory distress. She has no wheezes. She has no rales. She exhibits no tenderness.  Abdominal: Soft. Bowel sounds are normal. There is no tenderness.  Musculoskeletal: Normal range of motion. She exhibits no edema and no tenderness.  Lymphadenopathy:    She has no cervical adenopathy.  Neurological: She is alert and oriented to person, place, and time. No cranial nerve deficit. She exhibits normal muscle tone. Coordination normal.  Skin: Skin is warm and dry. No rash noted. She is not diaphoretic. No erythema. No pallor.  Psychiatric: She has a normal mood and affect. Her behavior is normal. Judgment and thought content normal.          Assessment & Plan:

## 2012-03-06 NOTE — Assessment & Plan Note (Signed)
Symptoms and urinalysis are most consistent with urinary tract infection. Will treat with Bactrim. Will send urine for culture. Patient will call or return to clinic if symptoms are not improving.

## 2012-03-08 LAB — URINE CULTURE

## 2012-03-13 ENCOUNTER — Other Ambulatory Visit: Payer: BC Managed Care – PPO

## 2012-03-26 ENCOUNTER — Other Ambulatory Visit: Payer: Self-pay | Admitting: *Deleted

## 2012-03-26 DIAGNOSIS — F419 Anxiety disorder, unspecified: Secondary | ICD-10-CM

## 2012-03-26 DIAGNOSIS — R300 Dysuria: Secondary | ICD-10-CM

## 2012-03-26 DIAGNOSIS — I10 Essential (primary) hypertension: Secondary | ICD-10-CM

## 2012-03-26 DIAGNOSIS — E785 Hyperlipidemia, unspecified: Secondary | ICD-10-CM

## 2012-03-26 DIAGNOSIS — N63 Unspecified lump in unspecified breast: Secondary | ICD-10-CM

## 2012-03-26 MED ORDER — ALPRAZOLAM 0.25 MG PO TABS: 0.2500 mg | ORAL_TABLET | Freq: Three times a day (TID) | ORAL | Status: DC | PRN

## 2012-03-26 NOTE — Telephone Encounter (Signed)
rx called to pharmacy 

## 2012-05-07 ENCOUNTER — Other Ambulatory Visit: Payer: Self-pay | Admitting: *Deleted

## 2012-05-07 ENCOUNTER — Other Ambulatory Visit: Payer: Self-pay | Admitting: Internal Medicine

## 2012-05-07 MED ORDER — METFORMIN HCL 500 MG PO TABS: 500.0000 mg | ORAL_TABLET | Freq: Every day | ORAL | Status: DC

## 2012-05-12 ENCOUNTER — Encounter: Payer: Self-pay | Admitting: Internal Medicine

## 2012-05-12 ENCOUNTER — Ambulatory Visit (INDEPENDENT_AMBULATORY_CARE_PROVIDER_SITE_OTHER): Payer: BC Managed Care – PPO | Admitting: Internal Medicine

## 2012-05-12 VITALS — BP 140/80 | HR 78 | Temp 97.7°F | Ht 61.5 in | Wt 197.5 lb

## 2012-05-12 DIAGNOSIS — N390 Urinary tract infection, site not specified: Secondary | ICD-10-CM

## 2012-05-12 DIAGNOSIS — M5430 Sciatica, unspecified side: Secondary | ICD-10-CM

## 2012-05-12 DIAGNOSIS — M5431 Sciatica, right side: Secondary | ICD-10-CM | POA: Insufficient documentation

## 2012-05-12 LAB — POCT URINALYSIS DIPSTICK
Bilirubin, UA: NEGATIVE
Glucose, UA: NEGATIVE
Ketones, UA: NEGATIVE
Nitrite, UA: NEGATIVE
Spec Grav, UA: 1.015

## 2012-05-12 MED ORDER — HYDROCODONE-ACETAMINOPHEN 5-500 MG PO TABS: 1.0000 | ORAL_TABLET | Freq: Three times a day (TID) | ORAL | Status: AC | PRN

## 2012-05-12 MED ORDER — SULFAMETHOXAZOLE-TRIMETHOPRIM 800-160 MG PO TABS: 1.0000 | ORAL_TABLET | Freq: Two times a day (BID) | ORAL | Status: AC

## 2012-05-12 MED ORDER — PREDNISONE 20 MG PO TABS: 20.0000 mg | ORAL_TABLET | Freq: Every day | ORAL | Status: AC

## 2012-05-12 MED ORDER — PREDNISONE (PAK) 10 MG PO TABS: 10.0000 mg | ORAL_TABLET | Freq: Every day | ORAL | Status: DC

## 2012-05-12 NOTE — Assessment & Plan Note (Signed)
Symptoms and urinalysis are consistent with urinary tract infection. Will send urine for culture.Will treat with Bactrim twice daily x7 days. Patient will call if no improvement over the next 72 hours.

## 2012-05-12 NOTE — Progress Notes (Signed)
Subjective:    Patient ID: Jessica Houston, female    DOB: 05-23-1966, 46 y.o.   MRN: [id number]  HPI 46 year old female with history of diabetes, hypertension, hyperlipidemia presents for acute visit complaining of several days of increased urinary frequency and burning pain with urination. She also notes some diffuse lower abdominal pain. She denies any fever, chills, flank pain.  She is also concerned today about recent right posterior hip right thigh and lower leg pain. She reports that pain starts in her posterior lower hip and sometimes radiates down her leg. She denies any trauma to her hip her leg. She denies any weakness in her legs. She has been using nambutone with some improvement. She has had this pain in the past and it has resolved with use of nonsteroidals.  Outpatient Encounter Prescriptions as of 05/12/2012  Medication Sig Dispense Refill  . ALPRAZolam (XANAX) 0.25 MG tablet Take 1 tablet (0.25 mg total) by mouth 3 (three) times daily as needed for anxiety.  90 tablet  3  . Calcium Carbonate Antacid (TUMS PO) Take 1 tablet by mouth as needed.        . Cholecalciferol (VITAMIN D PO) Take 1 tablet by mouth daily.        . clotrimazole-betamethasone (LOTRISONE) cream Apply topically 2 (two) times daily.  30 g  0  . Coenzyme Q10 (COQ10 PO) Take 1 tablet by mouth daily.        . fluticasone (FLONASE) 50 MCG/ACT nasal spray Place 2 sprays into the nose as needed.        . fluticasone (FLOVENT HFA) 110 MCG/ACT inhaler Inhale 1 puff into the lungs 2 (two) times daily.  1 Inhaler  6  . glucose blood (ONE TOUCH TEST STRIPS) test strip 1 each by Other route 2 (two) times daily as needed. DISPENSE STRIPS FOR NANO METER DX: 250.00  150 each  3  . Lancets (ONETOUCH ULTRASOFT) lancets 1 each by Other route 2 (two) times daily. Dispense one touch nano lancets  150 each  3  . loratadine (CLARITIN) 10 MG tablet Take 10 mg by mouth daily as needed.        . metFORMIN (GLUCOPHAGE) 500 MG tablet Take  1 tablet (500 mg total) by mouth daily.  60 tablet  5  . Multiple Vitamin (MULTIVITAMIN) capsule Take 1 capsule by mouth daily.        . nebivolol (BYSTOLIC) 5 MG tablet Take 1 tablet (5 mg total) by mouth daily.  30 tablet  0  . Omega-3 Fatty Acids (FISH OIL) 1000 MG CAPS Take 1 capsule by mouth daily.        . pantoprazole (PROTONIX) 40 MG tablet Take 1 tablet (40 mg total) by mouth daily.  90 tablet  1  . rosuvastatin (CRESTOR) 5 MG tablet Take 1 tablet (5 mg total) by mouth daily.  90 tablet  3  . HYDROcodone-acetaminophen (VICODIN) 5-500 MG per tablet Take 1 tablet by mouth every 8 (eight) hours as needed for pain.  20 tablet  0  . predniSONE (DELTASONE) 20 MG tablet Take 1 tablet (20 mg total) by mouth daily.  5 tablet  0  . sulfamethoxazole-trimethoprim (BACTRIM DS,SEPTRA DS) 800-160 MG per tablet Take 1 tablet by mouth 2 (two) times daily.  6 tablet  0  . DISCONTD: predniSONE (STERAPRED UNI-PAK) 10 MG tablet Take 1 tablet (10 mg total) by mouth daily.  21 tablet  0    BP 140/80  Pulse 78  Temp  97.7 F (36.5 C) (Oral)  Ht 5' 1.5" (1.562 m)  Wt 197 lb 8 oz (89.585 kg)  BMI 36.71 kg/m2  SpO2 98%  LMP 04/26/2012  Review of Systems  Constitutional: Negative for fever, chills, appetite change, fatigue and unexpected weight change.  HENT: Negative for ear pain, congestion, sore throat, trouble swallowing, neck pain, voice change and sinus pressure.   Eyes: Negative for visual disturbance.  Respiratory: Negative for cough, shortness of breath, wheezing and stridor.   Cardiovascular: Negative for chest pain, palpitations and leg swelling.  Gastrointestinal: Positive for abdominal pain. Negative for nausea, vomiting, diarrhea, constipation, blood in stool, abdominal distention and anal bleeding.  Genitourinary: Positive for dysuria, urgency, frequency and pelvic pain. Negative for flank pain and vaginal pain.  Musculoskeletal: Positive for myalgias and arthralgias. Negative for gait  problem.  Skin: Negative for color change and rash.  Neurological: Negative for dizziness and headaches.  Hematological: Negative for adenopathy. Does not bruise/bleed easily.  Psychiatric/Behavioral: Negative for suicidal ideas, disturbed wake/sleep cycle and dysphoric mood. The patient is not nervous/anxious.        Objective:   Physical Exam  Constitutional: She is oriented to person, place, and time. She appears well-developed and well-nourished. No distress.  HENT:  Head: Normocephalic and atraumatic.  Right Ear: External ear normal.  Left Ear: External ear normal.  Nose: Nose normal.  Mouth/Throat: Oropharynx is clear and moist. No oropharyngeal exudate.  Eyes: Conjunctivae are normal. Pupils are equal, round, and reactive to light. Right eye exhibits no discharge. Left eye exhibits no discharge. No scleral icterus.  Neck: Normal range of motion. Neck supple. No tracheal deviation present. No thyromegaly present.  Cardiovascular: Normal rate, regular rhythm, normal heart sounds and intact distal pulses.  Exam reveals no gallop and no friction rub.   No murmur heard. Pulmonary/Chest: Effort normal and breath sounds normal. No respiratory distress. She has no wheezes. She has no rales. She exhibits no tenderness.  Abdominal: Soft. She exhibits no distension. There is no tenderness.  Musculoskeletal: Normal range of motion. She exhibits no edema and no tenderness.       Lumbar back: She exhibits pain. She exhibits normal range of motion and no tenderness.       Back:  Lymphadenopathy:    She has no cervical adenopathy.  Neurological: She is alert and oriented to person, place, and time. No cranial nerve deficit. She exhibits normal muscle tone. Coordination normal.  Skin: Skin is warm and dry. No rash noted. She is not diaphoretic. No erythema. No pallor.  Psychiatric: She has a normal mood and affect. Her behavior is normal. Judgment and thought content normal.            Assessment & Plan:

## 2012-05-12 NOTE — Assessment & Plan Note (Signed)
Symptoms and exam consistent with right-sided sciatica. Will treat with prednisone course. If symptoms do not improve, would favor physical therapy for further treatment.

## 2012-05-14 LAB — URINE CULTURE
Colony Count: NO GROWTH
Organism ID, Bacteria: NO GROWTH

## 2012-05-19 ENCOUNTER — Other Ambulatory Visit (INDEPENDENT_AMBULATORY_CARE_PROVIDER_SITE_OTHER): Payer: BC Managed Care – PPO | Admitting: *Deleted

## 2012-05-19 ENCOUNTER — Other Ambulatory Visit: Payer: Self-pay | Admitting: *Deleted

## 2012-05-19 ENCOUNTER — Telehealth: Payer: Self-pay | Admitting: Internal Medicine

## 2012-05-19 DIAGNOSIS — N390 Urinary tract infection, site not specified: Secondary | ICD-10-CM

## 2012-05-19 LAB — POCT URINALYSIS DIPSTICK
Ketones, UA: NEGATIVE
Nitrite, UA: NEGATIVE
Protein, UA: NEGATIVE
Urobilinogen, UA: 0.2

## 2012-05-19 MED ORDER — MELOXICAM 15 MG PO TABS: 15.0000 mg | ORAL_TABLET | Freq: Every day | ORAL | Status: DC

## 2012-05-19 NOTE — Telephone Encounter (Signed)
If she would like, we can set her up with Dr. Yves Dill at [facility] who may be able to do an injection to help with pain.

## 2012-05-19 NOTE — Telephone Encounter (Signed)
We can try Meloxicam 15mg  po daily, disp #30

## 2012-05-19 NOTE — Telephone Encounter (Signed)
Jessica Houston WANTED TO LET YOU KNOW THE MEDS HELPED HER LEG BUT TODAY 7/1 IT STARTED ACHING THIS MORNING

## 2012-05-19 NOTE — Telephone Encounter (Signed)
Patient advised as instructed via telephone, Rx sent to [facility] pharmacy.

## 2012-05-19 NOTE — Addendum Note (Signed)
Addended by: Jobie Quaker on: 05/19/2012 04:59 PM   Modules accepted: Orders

## 2012-05-19 NOTE — Telephone Encounter (Signed)
Patient advised as instructed via telephone, she stated that she will think about this and let us know.  Patient wants to know if she can try a different antiinflammatory to see if that will help.  Please advise.

## 2012-05-22 ENCOUNTER — Other Ambulatory Visit: Payer: Self-pay | Admitting: Internal Medicine

## 2012-05-22 LAB — URINE CULTURE: Colony Count: 4000

## 2012-06-12 ENCOUNTER — Other Ambulatory Visit (INDEPENDENT_AMBULATORY_CARE_PROVIDER_SITE_OTHER): Payer: BC Managed Care – PPO | Admitting: *Deleted

## 2012-06-12 DIAGNOSIS — E119 Type 2 diabetes mellitus without complications: Secondary | ICD-10-CM

## 2012-06-12 LAB — COMPREHENSIVE METABOLIC PANEL
AST: 31 U/L (ref 0–37)
Alkaline Phosphatase: 52 U/L (ref 39–117)
BUN: 15 mg/dL (ref 6–23)
Creatinine, Ser: 0.9 mg/dL (ref 0.4–1.2)

## 2012-06-12 LAB — HEMOGLOBIN A1C: Hgb A1c MFr Bld: 7.1 % — ABNORMAL HIGH (ref 4.6–6.5)

## 2012-06-12 LAB — LIPID PANEL
Cholesterol: 190 mg/dL (ref 0–200)
Total CHOL/HDL Ratio: 5
Triglycerides: 215 mg/dL — ABNORMAL HIGH (ref 0.0–149.0)
VLDL: 43 mg/dL — ABNORMAL HIGH (ref 0.0–40.0)

## 2012-06-16 ENCOUNTER — Other Ambulatory Visit: Payer: Self-pay | Admitting: Internal Medicine

## 2012-06-16 MED ORDER — METFORMIN HCL 500 MG PO TABS: 500.0000 mg | ORAL_TABLET | Freq: Two times a day (BID) | ORAL | Status: DC

## 2012-06-16 NOTE — Telephone Encounter (Signed)
Metformin 500 mg twice a day. Patient only has it on the prescription for 1 daily.

## 2012-06-16 NOTE — Telephone Encounter (Signed)
Rx sent to pharmacy, patient advised as instructed via telephone. 

## 2012-06-17 ENCOUNTER — Encounter: Payer: Self-pay | Admitting: Internal Medicine

## 2012-06-17 ENCOUNTER — Ambulatory Visit (INDEPENDENT_AMBULATORY_CARE_PROVIDER_SITE_OTHER): Payer: BC Managed Care – PPO | Admitting: Internal Medicine

## 2012-06-17 VITALS — BP 130/88 | HR 84 | Temp 98.2°F | Ht 61.5 in | Wt 199.2 lb

## 2012-06-17 DIAGNOSIS — I10 Essential (primary) hypertension: Secondary | ICD-10-CM

## 2012-06-17 DIAGNOSIS — E785 Hyperlipidemia, unspecified: Secondary | ICD-10-CM

## 2012-06-17 DIAGNOSIS — E119 Type 2 diabetes mellitus without complications: Secondary | ICD-10-CM

## 2012-06-17 DIAGNOSIS — R350 Frequency of micturition: Secondary | ICD-10-CM

## 2012-06-17 LAB — POCT URINALYSIS DIPSTICK
Bilirubin, UA: NEGATIVE
Glucose, UA: NEGATIVE
Ketones, UA: NEGATIVE

## 2012-06-17 MED ORDER — SULFAMETHOXAZOLE-TMP DS 800-160 MG PO TABS: 1.0000 | ORAL_TABLET | Freq: Two times a day (BID) | ORAL | Status: AC

## 2012-06-17 NOTE — Assessment & Plan Note (Signed)
Patient with urinary frequency. Urinalysis is remarkable for blood and leukocytes. Consistent with urinary tract infection. Will treat with Bactrim. Will send urine for culture.

## 2012-06-17 NOTE — Assessment & Plan Note (Signed)
Blood pressure well-controlled on current medications. We'll plan to continue. Recent renal function was normal. Followup 3 months.

## 2012-06-17 NOTE — Assessment & Plan Note (Signed)
Blood sugars have been gradually increasing with most recent hemoglobin A1c of 7.1%. Encouraged better compliance with diet low in processed carbohydrates. We'll continue Glucophage for now. Discussed potential of adding glipizide in the future if sugars continue to rise. Follow up 3 months.

## 2012-06-17 NOTE — Progress Notes (Signed)
Subjective:    Patient ID: Jessica Houston, female    DOB: 1966-08-31, 46 y.o.   MRN: [id number]  HPI 46 year old female with history of diabetes, hyperlipidemia, hypertension presents for followup. She reports she is generally doing well. In regards to her diabetes, she did not bring a record of blood sugars today. She notes some dietary indiscretion but reports that she has been exercising more regularly. In regards to hypertension, she reports full compliance with her Bystolic. She denies any palpitations, headache, chest pain. In regards to hyperlipidemia, she reports intermittent compliance with Crestor. She denies any noted myalgias from this medication.  Over the last few weeks, she has noted some increased urinary frequency. She denies any dysuria, flank pain, fever, chills.  Outpatient Encounter Prescriptions as of 06/17/2012  Medication Sig Dispense Refill  . ALPRAZolam (XANAX) 0.25 MG tablet Take 1 tablet (0.25 mg total) by mouth 3 (three) times daily as needed for anxiety.  90 tablet  3  . Calcium Carbonate Antacid (TUMS PO) Take 1 tablet by mouth as needed.        . Cholecalciferol (VITAMIN D PO) Take 1 tablet by mouth daily.        . clotrimazole-betamethasone (LOTRISONE) cream Apply topically 2 (two) times daily.  30 g  0  . Coenzyme Q10 (COQ10 PO) Take 1 tablet by mouth daily.        . fluticasone (FLONASE) 50 MCG/ACT nasal spray Place 2 sprays into the nose as needed.        . fluticasone (FLOVENT HFA) 110 MCG/ACT inhaler Inhale 1 puff into the lungs 2 (two) times daily.  1 Inhaler  6  . glucose blood (ONE TOUCH TEST STRIPS) test strip 1 each by Other route 2 (two) times daily as needed. DISPENSE STRIPS FOR NANO METER DX: 250.00  150 each  3  . Lancets (ONETOUCH ULTRASOFT) lancets 1 each by Other route 2 (two) times daily. Dispense one touch nano lancets  150 each  3  . loratadine (CLARITIN) 10 MG tablet Take 10 mg by mouth daily as needed.        . meloxicam (MOBIC) 15 MG tablet  Take 1 tablet (15 mg total) by mouth daily.  30 tablet  0  . metFORMIN (GLUCOPHAGE) 500 MG tablet Take 1 tablet (500 mg total) by mouth 2 (two) times daily.  60 tablet  6  . Multiple Vitamin (MULTIVITAMIN) capsule Take 1 capsule by mouth daily.        . nebivolol (BYSTOLIC) 5 MG tablet Take 1 tablet (5 mg total) by mouth daily.  30 tablet  0  . Omega-3 Fatty Acids (FISH OIL) 1000 MG CAPS Take 1 capsule by mouth daily.        . pantoprazole (PROTONIX) 40 MG tablet Take 1 tablet (40 mg total) by mouth daily.  90 tablet  1  . rosuvastatin (CRESTOR) 5 MG tablet Take 1 tablet (5 mg total) by mouth daily.  90 tablet  3  . DISCONTD: pantoprazole (PROTONIX) 40 MG tablet TAKE ONE TABLET BY MOUTH ONE TIME DAILY  30 tablet  0  . sulfamethoxazole-trimethoprim (BACTRIM DS) 800-160 MG per tablet Take 1 tablet by mouth 2 (two) times daily.  28 tablet  0   BP 130/88  Pulse 84  Temp 98.2 F (36.8 C) (Oral)  Ht 5' 1.5" (1.562 m)  Wt 199 lb 4 oz (90.379 kg)  BMI 37.04 kg/m2  SpO2 98%  LMP 05/26/2012  Review of Systems  Constitutional: Negative  for fever, chills, appetite change, fatigue and unexpected weight change.  HENT: Negative for ear pain, congestion, sore throat, trouble swallowing, neck pain, voice change and sinus pressure.   Eyes: Negative for visual disturbance.  Respiratory: Negative for cough, shortness of breath, wheezing and stridor.   Cardiovascular: Negative for chest pain, palpitations and leg swelling.  Gastrointestinal: Negative for nausea, vomiting, abdominal pain, diarrhea, constipation, blood in stool, abdominal distention and anal bleeding.  Genitourinary: Positive for frequency. Negative for dysuria, urgency, hematuria and flank pain.  Musculoskeletal: Negative for myalgias, arthralgias and gait problem.  Skin: Negative for color change and rash.  Neurological: Negative for dizziness and headaches.  Hematological: Negative for adenopathy. Does not bruise/bleed easily.    Psychiatric/Behavioral: Negative for suicidal ideas, disturbed wake/sleep cycle and dysphoric mood. The patient is not nervous/anxious.        Objective:   Physical Exam  Constitutional: She is oriented to person, place, and time. She appears well-developed and well-nourished. No distress.  HENT:  Head: Normocephalic and atraumatic.  Right Ear: External ear normal.  Left Ear: External ear normal.  Nose: Nose normal.  Mouth/Throat: Oropharynx is clear and moist. No oropharyngeal exudate.  Eyes: Conjunctivae are normal. Pupils are equal, round, and reactive to light. Right eye exhibits no discharge. Left eye exhibits no discharge. No scleral icterus.  Neck: Normal range of motion. Neck supple. No tracheal deviation present. No thyromegaly present.  Cardiovascular: Normal rate, regular rhythm, normal heart sounds and intact distal pulses.  Exam reveals no gallop and no friction rub.   No murmur heard. Pulmonary/Chest: Effort normal and breath sounds normal. No respiratory distress. She has no wheezes. She has no rales. She exhibits no tenderness.  Abdominal: Soft. Bowel sounds are normal. She exhibits no distension. There is no tenderness.  Musculoskeletal: Normal range of motion. She exhibits no edema and no tenderness.  Lymphadenopathy:    She has no cervical adenopathy.  Neurological: She is alert and oriented to person, place, and time. No cranial nerve deficit. She exhibits normal muscle tone. Coordination normal.  Skin: Skin is warm and dry. No rash noted. She is not diaphoretic. No erythema. No pallor.  Psychiatric: She has a normal mood and affect. Her behavior is normal. Judgment and thought content normal.          Assessment & Plan:

## 2012-06-17 NOTE — Assessment & Plan Note (Signed)
Recent LDL was noted to be elevated at 125. Patient only intermittently takes Crestor. Encouraged better compliance with both medication and diet low in processed carbohydrates and saturated fat. Followup in 3 months.

## 2012-06-19 LAB — URINE CULTURE
Colony Count: NO GROWTH
Organism ID, Bacteria: NO GROWTH

## 2012-06-21 ENCOUNTER — Other Ambulatory Visit: Payer: Self-pay | Admitting: Internal Medicine

## 2012-08-15 ENCOUNTER — Telehealth: Payer: Self-pay | Admitting: Internal Medicine

## 2012-08-15 NOTE — Telephone Encounter (Signed)
Pt is wondering if she could get any samples of Viastolic 5mg .  Please call cell phone at [phone]

## 2012-08-18 NOTE — Telephone Encounter (Signed)
Patient called back I went and spoke with Dr. Dan Humphreys she said it was fine to give a samples of bystolic. I advised patient that they would be at the front desk waiting for her.

## 2012-09-01 ENCOUNTER — Telehealth: Payer: Self-pay | Admitting: Internal Medicine

## 2012-09-01 NOTE — Telephone Encounter (Signed)
Yes, she needs to be seen in an urgent visit.

## 2012-09-01 NOTE — Telephone Encounter (Signed)
Dr. Dan Humphreys since we have more staff here shouldn't patient be asked to schedule an appt instead of our office calling in something for her?  Please advise.

## 2012-09-01 NOTE — Telephone Encounter (Signed)
Pt has a Bladder Infection and was wondering if she could have something called in. She has had one before and wanted the same thing called in if possible. She uses [facility] on [address].

## 2012-09-02 NOTE — Telephone Encounter (Signed)
Called pt at home and cell, left message to return call.

## 2012-09-04 ENCOUNTER — Ambulatory Visit: Payer: Self-pay

## 2012-09-09 ENCOUNTER — Telehealth: Payer: Self-pay | Admitting: Internal Medicine

## 2012-09-09 NOTE — Telephone Encounter (Signed)
Pt is needing forms for tomorrow for her Orientation. She was wondering if it could be faxed to her if possible.  Fax number [phone]

## 2012-09-09 NOTE — Telephone Encounter (Signed)
Pt dropped off forms for [employer]. I put them in your inbox up front.

## 2012-09-17 ENCOUNTER — Ambulatory Visit: Payer: BC Managed Care – PPO | Admitting: Internal Medicine

## 2012-09-18 NOTE — Telephone Encounter (Signed)
Patient has an appt on 09/19/2012 with Dr. Dan Humphreys, will address during the visit.

## 2012-09-19 ENCOUNTER — Encounter: Payer: Self-pay | Admitting: Internal Medicine

## 2012-09-19 ENCOUNTER — Telehealth: Payer: Self-pay

## 2012-09-19 ENCOUNTER — Ambulatory Visit (INDEPENDENT_AMBULATORY_CARE_PROVIDER_SITE_OTHER): Payer: BC Managed Care – PPO | Admitting: Internal Medicine

## 2012-09-19 VITALS — BP 140/88 | HR 76 | Temp 98.2°F | Ht 61.5 in | Wt 192.8 lb

## 2012-09-19 DIAGNOSIS — N390 Urinary tract infection, site not specified: Secondary | ICD-10-CM

## 2012-09-19 DIAGNOSIS — I10 Essential (primary) hypertension: Secondary | ICD-10-CM

## 2012-09-19 DIAGNOSIS — G4730 Sleep apnea, unspecified: Secondary | ICD-10-CM

## 2012-09-19 DIAGNOSIS — R5383 Other fatigue: Secondary | ICD-10-CM

## 2012-09-19 DIAGNOSIS — E669 Obesity, unspecified: Secondary | ICD-10-CM

## 2012-09-19 DIAGNOSIS — Z0000 Encounter for general adult medical examination without abnormal findings: Secondary | ICD-10-CM

## 2012-09-19 DIAGNOSIS — R5381 Other malaise: Secondary | ICD-10-CM

## 2012-09-19 DIAGNOSIS — E785 Hyperlipidemia, unspecified: Secondary | ICD-10-CM

## 2012-09-19 DIAGNOSIS — E119 Type 2 diabetes mellitus without complications: Secondary | ICD-10-CM

## 2012-09-19 DIAGNOSIS — Z23 Encounter for immunization: Secondary | ICD-10-CM

## 2012-09-19 LAB — COMPREHENSIVE METABOLIC PANEL
CO2: 24 mEq/L (ref 19–32)
Chloride: 104 mEq/L (ref 96–112)
Creatinine, Ser: 0.8 mg/dL (ref 0.4–1.2)
GFR: 80.68 mL/min (ref >60.00)
Glucose, Bld: 106 mg/dL — ABNORMAL HIGH (ref 70–99)
Potassium: 4.1 mEq/L (ref 3.5–5.1)
Sodium: 137 mEq/L (ref 135–145)
Total Bilirubin: 0.2 mg/dL — ABNORMAL LOW (ref 0.3–1.2)

## 2012-09-19 LAB — POCT URINALYSIS DIPSTICK
Bilirubin, UA: NEGATIVE
Glucose, UA: NEGATIVE
Ketones, UA: NEGATIVE
Leukocytes, UA: NEGATIVE
Nitrite, UA: NEGATIVE
Protein, UA: NEGATIVE
Spec Grav, UA: 1.025
Urobilinogen, UA: 0.2
pH, UA: 5.5

## 2012-09-19 LAB — MICROALBUMIN / CREATININE URINE RATIO
Creatinine,U: 141.1 mg/dL
Microalb Creat Ratio: 3.3 mg/g (ref 0.0–30.0)

## 2012-09-19 LAB — TSH: TSH: 1.16 u[IU]/mL (ref 0.35–5.50)

## 2012-09-19 LAB — LIPID PANEL
Cholesterol: 197 mg/dL (ref 0–200)
HDL: 29.7 mg/dL — ABNORMAL LOW (ref >39.00)
VLDL: 38.4 mg/dL (ref 0.0–40.0)

## 2012-09-19 MED ORDER — CIPROFLOXACIN HCL 500 MG PO TABS: 500.0000 mg | ORAL_TABLET | Freq: Two times a day (BID) | ORAL | Status: DC

## 2012-09-19 NOTE — Telephone Encounter (Signed)
Yes, please send for culture. Please call in Cipro 500mg  po bid x 3 days.

## 2012-09-19 NOTE — Assessment & Plan Note (Signed)
BMI 35. Reviewed diet today. Patient consumes large amount of processed carbohydrates. Encouraged her to limit carbohydrate intake and increased intake of meat and protein. Patient would like to try phentermine however we discussed that this would not be safe given elevated blood pressure. We also discussed alternative medications to help with weight loss including Belviq, however use of this is limited by cost. Encouraged her to keep a food diary and [place] of exercising at least 30 minutes 5 days per week. Followup in 3 months.

## 2012-09-19 NOTE — Telephone Encounter (Signed)
Patient advised by Rosalin Hawking that we are sending in a Rx for Cipro to her pharmacy.

## 2012-09-19 NOTE — Progress Notes (Signed)
Subjective:    Patient ID: Jessica Houston, female    DOB: [birth year], 46 y.o.   MRN: [id number]  HPI 46 year old female with history of diabetes, hypertension, hyperlipidemia, obesity presents for followup. In regards to diabetes, she reports most fasting blood sugars near 120-150. She reports full compliance with metformin. She has been trying to make effort at increasing physical activity and decreasing intake of processed carbohydrates. However, on review of diet she reports she is eating some processed foods and high sugar beverages. She has lost 7 pounds since her last visit. She is interested in potentially trying phentermine to help with appetite suppression.  She is also concerned today about occasional dysuria and urinary urgency. She denies any fever, chills, flank pain. She denies hematuria. Dysuria and urinary urgency have been present off-and-on for several weeks.  Outpatient Encounter Prescriptions as of 09/19/2012  Medication Sig Dispense Refill  . ALPRAZolam (XANAX) 0.25 MG tablet Take 1 tablet (0.25 mg total) by mouth 3 (three) times daily as needed for anxiety.  90 tablet  3  . Calcium Carbonate Antacid (TUMS PO) Take 1 tablet by mouth as needed.        . Cholecalciferol (VITAMIN D PO) Take 1 tablet by mouth daily.        . clotrimazole-betamethasone (LOTRISONE) cream Apply topically 2 (two) times daily.  30 g  0  . Coenzyme Q10 (COQ10 PO) Take 1 tablet by mouth daily.        . fluticasone (FLONASE) 50 MCG/ACT nasal spray Place 2 sprays into the nose as needed.        . fluticasone (FLOVENT HFA) 110 MCG/ACT inhaler Inhale 1 puff into the lungs 2 (two) times daily.  1 Inhaler  6  . glucose blood (ONE TOUCH TEST STRIPS) test strip 1 each by Other route 2 (two) times daily as needed. DISPENSE STRIPS FOR NANO METER DX: 250.00  150 each  3  . Lancets (ONETOUCH ULTRASOFT) lancets 1 each by Other route 2 (two) times daily. Dispense one touch nano lancets  150 each  3  . loratadine  (CLARITIN) 10 MG tablet Take 10 mg by mouth daily as needed.        . meloxicam (MOBIC) 15 MG tablet Take 1 tablet (15 mg total) by mouth daily.  30 tablet  0  . metFORMIN (GLUCOPHAGE) 500 MG tablet Take 1 tablet (500 mg total) by mouth 2 (two) times daily.  60 tablet  6  . Multiple Vitamin (MULTIVITAMIN) capsule Take 1 capsule by mouth daily.        . nebivolol (BYSTOLIC) 5 MG tablet Take 1 tablet (5 mg total) by mouth daily.  30 tablet  0  . Omega-3 Fatty Acids (FISH OIL) 1000 MG CAPS Take 1 capsule by mouth daily.        . pantoprazole (PROTONIX) 40 MG tablet TAKE ONE TABLET BY MOUTH ONE TIME DAILY  30 tablet  6  . rosuvastatin (CRESTOR) 5 MG tablet Take 1 tablet (5 mg total) by mouth daily.  90 tablet  3  . DISCONTD: pantoprazole (PROTONIX) 40 MG tablet Take 1 tablet (40 mg total) by mouth daily.  90 tablet  1    Review of Systems  Constitutional: Positive for fatigue. Negative for fever, chills, appetite change and unexpected weight change.  HENT: Negative for ear pain, congestion, sore throat, trouble swallowing, neck pain, voice change and sinus pressure.   Eyes: Negative for visual disturbance.  Respiratory: Negative for cough, shortness of breath,  wheezing and stridor.   Cardiovascular: Negative for chest pain, palpitations and leg swelling.  Gastrointestinal: Negative for nausea, vomiting, abdominal pain, diarrhea, constipation, blood in stool, abdominal distention and anal bleeding.  Genitourinary: Positive for dysuria, urgency and frequency. Negative for hematuria and flank pain.  Musculoskeletal: Negative for myalgias, arthralgias and gait problem.  Skin: Negative for color change and rash.  Neurological: Negative for dizziness and headaches.  Hematological: Negative for adenopathy. Does not bruise/bleed easily.  Psychiatric/Behavioral: Negative for suicidal ideas, disturbed wake/sleep cycle and dysphoric mood. The patient is not nervous/anxious.        Objective:   Physical  Exam  Constitutional: She is oriented to person, place, and time. She appears well-developed and well-nourished. No distress.  HENT:  Head: Normocephalic and atraumatic.  Right Ear: External ear normal.  Left Ear: External ear normal.  Nose: Nose normal.  Mouth/Throat: Oropharynx is clear and moist. No oropharyngeal exudate.  Eyes: Conjunctivae normal are normal. Pupils are equal, round, and reactive to light. Right eye exhibits no discharge. Left eye exhibits no discharge. No scleral icterus.  Neck: Normal range of motion. Neck supple. No tracheal deviation present. No thyromegaly present.  Cardiovascular: Normal rate, regular rhythm, normal heart sounds and intact distal pulses.  Exam reveals no gallop and no friction rub.   No murmur heard. Pulmonary/Chest: Effort normal and breath sounds normal. No respiratory distress. She has no wheezes. She has no rales. She exhibits no tenderness.  Abdominal: Soft. Bowel sounds are normal. There is no tenderness.  Musculoskeletal: Normal range of motion. She exhibits no edema and no tenderness.  Lymphadenopathy:    She has no cervical adenopathy.  Neurological: She is alert and oriented to person, place, and time. No cranial nerve deficit. She exhibits normal muscle tone. Coordination normal.  Skin: Skin is warm and dry. No rash noted. She is not diaphoretic. No erythema. No pallor.  Psychiatric: She has a normal mood and affect. Her behavior is normal. Judgment and thought content normal.          Assessment & Plan:

## 2012-09-19 NOTE — Telephone Encounter (Signed)
Pt requested POC u/a as she has had mild symptoms of UTI - test shows moderate amount of blood.  Do you want to send it to [facility] for culture?

## 2012-09-19 NOTE — Assessment & Plan Note (Signed)
Blood pressure elevated today. Improve somewhat on recheck. Will have patient monitor at home and call if blood pressure consistently greater than 140/90. For now, will continue Bystolic. Will check renal function with labs today.

## 2012-09-19 NOTE — Telephone Encounter (Signed)
Urine culture ordered

## 2012-09-19 NOTE — Assessment & Plan Note (Signed)
Patient reports fasting blood sugars near 1:30. Will check A1c with labs today. Continue metformin. Reviewed diet today and encouraged patient to limit intake of processed carbohydrates.

## 2012-09-19 NOTE — Assessment & Plan Note (Signed)
Patient describes symptoms of dysuria. Urinalysis remarkable for blood. Will send urine for culture. Treat with Cipro 500 mg twice daily x3 days.

## 2012-09-19 NOTE — Addendum Note (Signed)
Addended by: Gilmer Mor on: 09/19/2012 01:57 PM   Modules accepted: Orders

## 2012-09-19 NOTE — Assessment & Plan Note (Signed)
Will check lipids with labs today. Goal LDL less than 70. Continue Crestor.

## 2012-09-20 LAB — HEPATITIS B SURFACE ANTIBODY,QUALITATIVE: Hep B S Ab: POSITIVE — AB

## 2012-09-21 LAB — URINE CULTURE

## 2012-09-23 ENCOUNTER — Other Ambulatory Visit: Payer: Self-pay | Admitting: *Deleted

## 2012-09-23 DIAGNOSIS — I10 Essential (primary) hypertension: Secondary | ICD-10-CM

## 2012-09-23 DIAGNOSIS — F419 Anxiety disorder, unspecified: Secondary | ICD-10-CM

## 2012-09-23 DIAGNOSIS — N63 Unspecified lump in unspecified breast: Secondary | ICD-10-CM

## 2012-09-23 DIAGNOSIS — E785 Hyperlipidemia, unspecified: Secondary | ICD-10-CM

## 2012-09-23 DIAGNOSIS — R300 Dysuria: Secondary | ICD-10-CM

## 2012-09-23 NOTE — Telephone Encounter (Signed)
Patient called wanting to get a refill on her Xanax. Patient was wondering if we had received pharmacy authorization request from [facility]. I explained to patient that she would have to wait for Dr. Dan Humphreys to return tomorrow before she could find out. Patient begged me to ask Dr. Darrick Huntsman if she would give her a prescription for 1 or 2 pills. I explained that I could ask, however I would not make her any promises and that Dr. Darrick Huntsman would most likely say no. Patient stated that she understood.

## 2012-09-23 NOTE — Telephone Encounter (Signed)
Fine to refill alprazolam same instructions as before #90

## 2012-09-24 MED ORDER — ALPRAZOLAM 0.25 MG PO TABS: 0.2500 mg | ORAL_TABLET | Freq: Three times a day (TID) | ORAL | Status: DC | PRN

## 2012-09-24 NOTE — Telephone Encounter (Signed)
Rx called to [facility], patient advised via message left on cell phone voicemail.

## 2012-10-09 ENCOUNTER — Other Ambulatory Visit: Payer: Self-pay | Admitting: Internal Medicine

## 2012-11-21 ENCOUNTER — Encounter: Payer: Self-pay | Admitting: Internal Medicine

## 2012-11-21 ENCOUNTER — Ambulatory Visit (INDEPENDENT_AMBULATORY_CARE_PROVIDER_SITE_OTHER): Payer: BC Managed Care – PPO | Admitting: Internal Medicine

## 2012-11-21 VITALS — BP 126/72 | HR 87 | Temp 98.0°F | Resp 16 | Wt 195.0 lb

## 2012-11-21 DIAGNOSIS — R0981 Nasal congestion: Secondary | ICD-10-CM

## 2012-11-21 DIAGNOSIS — R350 Frequency of micturition: Secondary | ICD-10-CM

## 2012-11-21 DIAGNOSIS — J3489 Other specified disorders of nose and nasal sinuses: Secondary | ICD-10-CM

## 2012-11-21 LAB — POCT URINALYSIS DIPSTICK
Bilirubin, UA: NEGATIVE
Ketones, UA: NEGATIVE
Leukocytes, UA: NEGATIVE
Nitrite, UA: NEGATIVE
Protein, UA: NEGATIVE
pH, UA: 5.5

## 2012-11-21 NOTE — Patient Instructions (Addendum)
Try using Simply Saline twice daily to flush your sinuses   Follow the flush  with sterile petroleum jelly (apply with q tip to coat the lining of your nostril)  You can try sudafed PE  10 to 30 mg every 8 hours as needed for pressure   -----------------------------------------------------------------------------------------------------  For your bladder,  We will send your urine for microscopic analysis and culture to see if there is acual blood or infection and decide where to go from here

## 2012-11-21 NOTE — Progress Notes (Addendum)
Patient ID: Jessica Houston, female   DOB: 08/26/1966, 47 y.o.   MRN: [id number]  Patient Active Problem List  Diagnosis  . Diabetes mellitus type 2, controlled  . Anxiety  . Asthma  . Hypertension  . Hyperlipidemia LDL goal < 70  . Obesity (BMI 30-39.9)  . UTI (lower urinary tract infection)  . Sinus congestion  . Increased urinary frequency    Subjective:  CC:   Chief Complaint  Patient presents with  . Urinary Tract Infection    ? bladder infection, frequent urination  . Sinusitis  . Otalgia    right    HPI:   Jessica Houston a 47 y.o. female who presents with 2 complaints  1) recent onset of Sinus puffiness without congestion , accompanied by occasional nose bleeds.  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   2) recurrent symptoms of cystitis.  Symptoms recur Every 3 months,  primarily involving frequency every 2 hours.  Symptoms are occasionally brought on by having intercourse but she is meticulous about voiding her bladder prior to intercourse. Her Last 3 urine cultures have been negative for UTI but were concerning for hematuria; however, No micro was done .    Avoids caffeine.  She has had an evaluation by Urology in the past but only a pelvic exam was done to evaluate for bladder prolapse. No cystoscopy done .  She was given samples of overactive bladder medications by urology but did not try them. Her last pelvic exam was normal this past summer.   Past Medical History  Diagnosis Date  . Diabetes mellitus     HgbA1c 6.8%, diet controlled. Optho 2010, urine microalbumin 10/2010  . S/P endoscopy     normal per pt. done at [facility]  . Asthma   . Sleep apnea     On CPAP  . Hyperlipidemia   . Hypertension   . Esophageal reflux     CONTROLLED ON CURRENT MEDICATION  . Vitamin D deficiency   . Bronchitis   . Anxiety      History reviewed. No pertinent past surgical history.       The following portions of the patient's history were reviewed and updated as appropriate: Allergies, current medications, and problem list.    Review of Systems:   Patient denies headache, fevers, malaise, unintentional weight loss, skin rash, eye pain,sore throat, dysphagia,  hemoptysis , cough, dyspnea, wheezing, chest pain, palpitations, orthopnea, edema, abdominal pain, nausea, melena, diarrhea, constipation, flank pain, dysuria, hematuria, numbness, tingling, seizures,  Focal weakness, Loss of consciousness,  Tremor, insomnia, depression, anxiety, and suicidal ideation.       History   Social History  . Marital Status: Married    Spouse Name: N/A    Number of Children: N/A  . Years of Education: N/A   Occupational History  . Not on file.   Social History Main Topics  . Smoking status: Never Smoker   . Smokeless tobacco: Never Used  . Alcohol Use: No  . Drug Use: No  . Sexually Active: Not on file   Other Topics Concern  . Not on file   Social History Narrative  . No narrative on file    Objective:  BP 126/72  Pulse 87  Temp 98 F (36.7 C) (Oral)  Resp 16  Wt 195 lb (88.451 kg)  SpO2 98%  LMP  11/07/2012  General appearance: alert, cooperative and appears stated age Ears: normal TM's and external ear canals both ears Throat: lips, mucosa, and tongue normal; teeth and gums normal Neck: no adenopathy, no carotid bruit, supple, symmetrical, trachea midline and thyroid not enlarged, symmetric, no tenderness/mass/nodules Back: symmetric, no curvature. ROM normal. No CVA tenderness. Lungs: clear to auscultation bilaterally Heart: regular rate and rhythm, S1, S2 normal, no murmur, click, rub or gallop Abdomen: soft, non-tender; bowel sounds normal; no masses,  no organomegaly Pulses: 2+ and symmetric Skin: Skin color, texture, turgor normal. No rashes or lesions Lymph nodes: Cervical,  supraclavicular, and axillary nodes normal.  Assessment and Plan:  Sinus congestion Reassurance provided that she has no signs of sinusitis. Given her history of abnormal anatomy and recent episodes of nosebleeds after using daily steroid nasal spray, I have suggested that she discontinue the steroid spray, resume Allegra with or without a decongestant as needed, add Sterile saline  flushes twice daily, and moisturize nasal passages with petroleum jelly.   Increased urinary frequency Her UA today showed no signs of infection but again was positive for blood. I have explained to her that dipstick UA does not necessarily prove that she is having hematuria due to cross reactivity with myoglobin. . Her urine will be sent for micro-and formal urinalysis. She does have microscopic hematuria in the absence of infection she will need to see urology for cystoscopy.   Updated Medication List Outpatient Encounter Prescriptions as of 11/21/2012  Medication Sig Dispense Refill  . ALPRAZolam (XANAX) 0.25 MG tablet Take 1 tablet (0.25 mg total) by mouth 3 (three) times daily as needed for anxiety.  90 tablet  0  . BYSTOLIC 5 MG tablet TAKE ONE TABLET BY MOUTH ONE TIME DAILY  30 tablet  5  . Calcium Carbonate Antacid (TUMS PO) Take 1 tablet by mouth as needed.        . fluticasone (FLOVENT HFA) 110 MCG/ACT inhaler Inhale 1 puff into the lungs 2 (two) times daily.  1 Inhaler  6  . glucose blood (ONE TOUCH TEST STRIPS) test strip 1 each by Other route 2 (two) times daily as needed. DISPENSE STRIPS FOR NANO METER DX: 250.00  150 each  3  . Lancets (ONETOUCH ULTRASOFT) lancets 1 each by Other route 2 (two) times daily. Dispense one touch nano lancets  150 each  3  . metFORMIN (GLUCOPHAGE) 500 MG tablet Take 1 tablet (500 mg total) by mouth 2 (two) times daily.  60 tablet  6  . Multiple Vitamin (MULTIVITAMIN) capsule Take 1 capsule by mouth daily.        . nebivolol (BYSTOLIC) 5 MG tablet Take 1 tablet (5 mg total)  by mouth daily.  30 tablet  0  . pantoprazole (PROTONIX) 40 MG tablet TAKE ONE TABLET BY MOUTH ONE TIME DAILY  30 tablet  6  . Cholecalciferol (VITAMIN D PO) Take 1 tablet by mouth daily.        . ciprofloxacin (CIPRO) 500 MG tablet Take 1 tablet (500 mg total) by mouth 2 (two) times daily.  6 tablet  0  . clotrimazole-betamethasone (LOTRISONE) cream Apply topically 2 (two) times daily.  30 g  0  . Coenzyme Q10 (COQ10 PO) Take 1 tablet by mouth daily.        . fluticasone (FLONASE) 50 MCG/ACT nasal spray Place 2 sprays into the nose as needed.        . loratadine (CLARITIN) 10 MG tablet Take 10 mg by  mouth daily as needed.        . meloxicam (MOBIC) 15 MG tablet Take 1 tablet (15 mg total) by mouth daily.  30 tablet  0  . Omega-3 Fatty Acids (FISH OIL) 1000 MG CAPS Take 1 capsule by mouth daily.        . rosuvastatin (CRESTOR) 5 MG tablet Take 1 tablet (5 mg total) by mouth daily.  90 tablet  3     Orders Placed This Encounter  Procedures  . CULTURE, URINE COMPREHENSIVE  . Urinalysis, Routine w reflex microscopic  . POCT Urinalysis Dipstick    No Follow-up on file.

## 2012-11-22 ENCOUNTER — Encounter: Payer: Self-pay | Admitting: Internal Medicine

## 2012-11-22 DIAGNOSIS — R0981 Nasal congestion: Secondary | ICD-10-CM | POA: Insufficient documentation

## 2012-11-22 DIAGNOSIS — R350 Frequency of micturition: Secondary | ICD-10-CM | POA: Insufficient documentation

## 2012-11-22 LAB — URINALYSIS, ROUTINE W REFLEX MICROSCOPIC
Bilirubin Urine: NEGATIVE
Glucose, UA: NEGATIVE mg/dL
Hgb urine dipstick: NEGATIVE
Protein, ur: NEGATIVE mg/dL
Urobilinogen, UA: 0.2 mg/dL (ref 0.0–1.0)

## 2012-11-22 NOTE — Assessment & Plan Note (Signed)
Reassurance provided that she has no signs of sinusitis. Given her history of abnormal anatomy and recent episodes of nosebleeds after using daily steroid nasal spray, I have suggested that she discontinue the steroid spray, resume Allegra with or without a decongestant as needed, add Sterile saline  flushes twice daily, and moisturize nasal passages with petroleum jelly.

## 2012-11-22 NOTE — Assessment & Plan Note (Signed)
Her UA today showed no signs of infection but again was positive for blood. I have explained to her that dipstick UA does not necessarily prove that she is having hematuria due to cross reactivity with myoglobin. . Her urine will be sent for micro-and formal urinalysis. She does have microscopic hematuria in the absence of infection she will need to see urology for cystoscopy.

## 2012-11-23 LAB — CULTURE, URINE COMPREHENSIVE: Colony Count: NO GROWTH

## 2012-11-25 ENCOUNTER — Other Ambulatory Visit: Payer: Self-pay | Admitting: *Deleted

## 2012-11-25 DIAGNOSIS — R350 Frequency of micturition: Secondary | ICD-10-CM

## 2012-11-28 ENCOUNTER — Telehealth: Payer: Self-pay | Admitting: Internal Medicine

## 2012-11-28 ENCOUNTER — Other Ambulatory Visit (INDEPENDENT_AMBULATORY_CARE_PROVIDER_SITE_OTHER): Payer: BC Managed Care – PPO | Admitting: *Deleted

## 2012-11-28 DIAGNOSIS — R350 Frequency of micturition: Secondary | ICD-10-CM

## 2012-11-28 LAB — POCT URINALYSIS DIPSTICK
Glucose, UA: NEGATIVE
Nitrite, UA: NEGATIVE
Protein, UA: NEGATIVE
Urobilinogen, UA: 0.2

## 2012-11-28 MED ORDER — SULFAMETHOXAZOLE-TRIMETHOPRIM 800-160 MG PO TABS: 1.0000 | ORAL_TABLET | Freq: Two times a day (BID) | ORAL | Status: DC

## 2012-11-28 MED ORDER — CIPROFLOXACIN HCL 500 MG PO TABS: 500.0000 mg | ORAL_TABLET | Freq: Two times a day (BID) | ORAL | Status: DC

## 2012-11-28 NOTE — Telephone Encounter (Signed)
Pt is getting another sinus infection and wanted to know if something could be called in to [facility] [address]

## 2012-11-28 NOTE — Addendum Note (Signed)
Addended by: Ronna Polio A on: 11/28/2012 04:37 PM   Modules accepted: Orders

## 2012-11-28 NOTE — Telephone Encounter (Signed)
Patient advised as instructed via telephone. 

## 2012-11-28 NOTE — Telephone Encounter (Signed)
Patient states that she is having congestion, greenish yellow draiage, she also complains of having pain in her sinus area.  She stated that last night she was very congested.  Please advise.

## 2012-11-28 NOTE — Telephone Encounter (Signed)
Patient cannot take Cipro it makes her heart race, please send in another antibiotic to [facility].

## 2012-11-28 NOTE — Telephone Encounter (Signed)
Dr. Darrick Huntsman felt that symptoms were consistent with viral infection. Also, Cipro that was used for UTI would cover for sinusitis.  If symptoms are worsening, then she should be seen.

## 2012-11-28 NOTE — Telephone Encounter (Signed)
I wrote for Cipro. Will need to follow urinalysis results.  If symptoms of sinus pain/pressure persist, she will need to be seen.

## 2012-11-28 NOTE — Telephone Encounter (Signed)
Rx written.

## 2012-11-28 NOTE — Addendum Note (Signed)
Addended by: Ronna Polio A on: 11/28/2012 05:01 PM   Modules accepted: Orders

## 2012-11-28 NOTE — Telephone Encounter (Signed)
Patient advised as instructed via telephone, I advised patient that she needs to make a follow up appt next week and she stated that she will just call back to do so.

## 2012-11-28 NOTE — Telephone Encounter (Signed)
Patient advised as instructed via telephone, she stated that she was never given a Rx when she was in to see Dr. Darrick Huntsman.  She would like something called to [facility].

## 2012-11-28 NOTE — Telephone Encounter (Signed)
Can you please make her a follow up next week? This way we can review urinalysis and ongoing sinus symptoms.

## 2012-11-29 LAB — URINALYSIS, MICROSCOPIC ONLY

## 2012-12-22 ENCOUNTER — Ambulatory Visit: Payer: BC Managed Care – PPO | Admitting: Internal Medicine

## 2012-12-30 ENCOUNTER — Telehealth: Payer: Self-pay | Admitting: Internal Medicine

## 2012-12-30 DIAGNOSIS — K219 Gastro-esophageal reflux disease without esophagitis: Secondary | ICD-10-CM

## 2012-12-30 DIAGNOSIS — N63 Unspecified lump in unspecified breast: Secondary | ICD-10-CM

## 2012-12-30 MED ORDER — PANTOPRAZOLE SODIUM 40 MG PO TBEC
40.0000 mg | DELAYED_RELEASE_TABLET | Freq: Every day | ORAL | Status: DC
Start: 2012-12-30 — End: 2013-07-29

## 2012-12-30 NOTE — Telephone Encounter (Signed)
Rx sent via Epic escript

## 2012-12-30 NOTE — Telephone Encounter (Signed)
Rx refill sent to [facility] pharmacy on file

## 2012-12-30 NOTE — Telephone Encounter (Signed)
Pt is needing refill on Protnics (Generic) pt is completely out of medication

## 2013-01-05 ENCOUNTER — Other Ambulatory Visit: Payer: Self-pay | Admitting: Internal Medicine

## 2013-01-05 ENCOUNTER — Telehealth: Payer: Self-pay | Admitting: Internal Medicine

## 2013-01-05 NOTE — Telephone Encounter (Signed)
Called [phone] for Prior Authorization on Pantoprazole 40 mg it was approved: Feb. 03,2014 -Feb. 17,2015 They will be faxing over the approval form and contacting the pt as well

## 2013-01-05 NOTE — Telephone Encounter (Signed)
Please advise 

## 2013-01-05 NOTE — Telephone Encounter (Signed)
Lets wait until we see her back to decide about additional labs.

## 2013-01-05 NOTE — Telephone Encounter (Signed)
Patient wants to know if she needs labs done before her appointment on 2.19.17? If she does can you send them to [facility] in [place].

## 2013-01-07 ENCOUNTER — Ambulatory Visit (INDEPENDENT_AMBULATORY_CARE_PROVIDER_SITE_OTHER): Payer: BC Managed Care – PPO | Admitting: Internal Medicine

## 2013-01-07 ENCOUNTER — Other Ambulatory Visit: Payer: Self-pay | Admitting: Internal Medicine

## 2013-01-07 ENCOUNTER — Encounter: Payer: Self-pay | Admitting: Internal Medicine

## 2013-01-07 ENCOUNTER — Telehealth: Payer: Self-pay | Admitting: *Deleted

## 2013-01-07 VITALS — BP 160/90 | HR 84 | Temp 98.6°F | Ht 61.5 in | Wt 193.5 lb

## 2013-01-07 DIAGNOSIS — F419 Anxiety disorder, unspecified: Secondary | ICD-10-CM

## 2013-01-07 DIAGNOSIS — E119 Type 2 diabetes mellitus without complications: Secondary | ICD-10-CM

## 2013-01-07 DIAGNOSIS — Z789 Other specified health status: Secondary | ICD-10-CM

## 2013-01-07 DIAGNOSIS — J019 Acute sinusitis, unspecified: Secondary | ICD-10-CM

## 2013-01-07 DIAGNOSIS — E785 Hyperlipidemia, unspecified: Secondary | ICD-10-CM

## 2013-01-07 DIAGNOSIS — F411 Generalized anxiety disorder: Secondary | ICD-10-CM

## 2013-01-07 DIAGNOSIS — J45909 Unspecified asthma, uncomplicated: Secondary | ICD-10-CM

## 2013-01-07 DIAGNOSIS — I10 Essential (primary) hypertension: Secondary | ICD-10-CM

## 2013-01-07 DIAGNOSIS — Z888 Allergy status to other drugs, medicaments and biological substances status: Secondary | ICD-10-CM

## 2013-01-07 DIAGNOSIS — J309 Allergic rhinitis, unspecified: Secondary | ICD-10-CM | POA: Insufficient documentation

## 2013-01-07 MED ORDER — FLUTICASONE PROPIONATE HFA 110 MCG/ACT IN AERO: 1.0000 | INHALATION_SPRAY | Freq: Two times a day (BID) | RESPIRATORY_TRACT | Status: DC

## 2013-01-07 MED ORDER — ALBUTEROL SULFATE HFA 108 (90 BASE) MCG/ACT IN AERS: 2.0000 | INHALATION_SPRAY | Freq: Four times a day (QID) | RESPIRATORY_TRACT | Status: DC | PRN

## 2013-01-07 MED ORDER — AMOXICILLIN-POT CLAVULANATE 875-125 MG PO TABS: 1.0000 | ORAL_TABLET | Freq: Two times a day (BID) | ORAL | Status: DC

## 2013-01-07 MED ORDER — FLUTICASONE PROPIONATE 50 MCG/ACT NA SUSP: 2.0000 | NASAL | Status: DC | PRN

## 2013-01-07 MED ORDER — METFORMIN HCL 500 MG PO TABS: 500.0000 mg | ORAL_TABLET | Freq: Two times a day (BID) | ORAL | Status: DC

## 2013-01-07 NOTE — Assessment & Plan Note (Signed)
Will restart Flonase today.

## 2013-01-07 NOTE — Progress Notes (Signed)
Subjective:    Patient ID: Jessica Houston, female    DOB: [birth year], 47 y.o.   MRN: [id number]  HPI 47 year old female with history of diabetes, hypertension, anxiety, hyperlipidemia presents for followup. She reports she is generally feeling well. In regards to diabetes, she notes sugars have been slightly higher than previous but she did not bring record of blood sugars today. She reports compliance with medication. She notes some dietary indiscretion. She denies any recent chest pain, palpitations. She does have occasional episodes of anxiety for which she uses Xanax with some improvement. Next  She is concerned today about several day history of nasal congestion. She reports purulent nasal drainage and frontal and maxillary sinus pressure. She has not been taking any medication for this. She denies any fever, chills, chest pain, cough.  Outpatient Encounter Prescriptions as of 01/07/2013  Medication Sig Dispense Refill  . ALPRAZolam (XANAX) 0.25 MG tablet Take 1 tablet (0.25 mg total) by mouth 3 (three) times daily as needed for anxiety.  90 tablet  0  . Calcium Carbonate Antacid (TUMS PO) Take 1 tablet by mouth as needed.        Marland Kitchen glucose blood (ONE TOUCH TEST STRIPS) test strip 1 each by Other route 2 (two) times daily as needed. DISPENSE STRIPS FOR NANO METER DX: 250.00  150 each  3  . Lancets (ONETOUCH ULTRASOFT) lancets 1 each by Other route 2 (two) times daily. Dispense one touch nano lancets  150 each  3  . Multiple Vitamin (MULTIVITAMIN) capsule Take 1 capsule by mouth daily.        . nebivolol (BYSTOLIC) 5 MG tablet Take 1 tablet (5 mg total) by mouth daily.  30 tablet  0  . pantoprazole (PROTONIX) 40 MG tablet Take 1 tablet (40 mg total) by mouth daily.  30 tablet  6  . [DISCONTINUED] FLOVENT HFA 110 MCG/ACT inhaler INHALE ONE PUFF BY MOUTH TWICE DAILY  1 Inhaler  5  . [DISCONTINUED] metFORMIN (GLUCOPHAGE) 500 MG tablet TAKE ONE TABLET BY MOUTH TWICE DAILY  60 tablet  5  . albuterol  (PROVENTIL HFA;VENTOLIN HFA) 108 (90 BASE) MCG/ACT inhaler Inhale 2 puffs into the lungs every 6 (six) hours as needed for wheezing.  1 Inhaler  6  . amoxicillin-clavulanate (AUGMENTIN) 875-125 MG per tablet Take 1 tablet by mouth 2 (two) times daily.  20 tablet  0  . Cholecalciferol (VITAMIN D PO) Take 1 tablet by mouth daily.        . Coenzyme Q10 (COQ10 PO) Take 1 tablet by mouth daily.        . fluticasone (FLONASE) 50 MCG/ACT nasal spray Place 2 sprays into the nose as needed.  16 g  11  . fluticasone (FLOVENT HFA) 110 MCG/ACT inhaler Inhale 1 puff into the lungs 2 (two) times daily.  1 Inhaler  12  . metFORMIN (GLUCOPHAGE) 500 MG tablet Take 1 tablet (500 mg total) by mouth 2 (two) times daily with a meal.  180 tablet  3  . Omega-3 Fatty Acids (FISH OIL) 1000 MG CAPS Take 1 capsule by mouth daily.        . rosuvastatin (CRESTOR) 5 MG tablet Take 1 tablet (5 mg total) by mouth daily.  90 tablet  3   No facility-administered encounter medications on file as of 01/07/2013.   BP 160/90  Pulse 84  Temp(Src) 98.6 F (37 C) (Oral)  Ht 5' 1.5" (1.562 m)  Wt 193 lb 8 oz (87.771 kg)  BMI 35.97  kg/m2  SpO2 98%  LMP 01/02/2013  Review of Systems  Constitutional: Negative for fever, chills, appetite change, fatigue and unexpected weight change.  HENT: Positive for congestion and sinus pressure. Negative for ear pain, sore throat, trouble swallowing, neck pain and voice change.   Eyes: Negative for visual disturbance.  Respiratory: Negative for cough, shortness of breath, wheezing and stridor.   Cardiovascular: Negative for chest pain, palpitations and leg swelling.  Gastrointestinal: Negative for nausea, vomiting, abdominal pain, diarrhea, constipation, blood in stool, abdominal distention and anal bleeding.  Genitourinary: Negative for dysuria and flank pain.  Musculoskeletal: Negative for myalgias, arthralgias and gait problem.  Skin: Negative for color change and rash.  Neurological:  Negative for dizziness and headaches.  Hematological: Negative for adenopathy. Does not bruise/bleed easily.  Psychiatric/Behavioral: Negative for suicidal ideas, sleep disturbance and dysphoric mood. The patient is not nervous/anxious.        Objective:   Physical Exam  Constitutional: She is oriented to person, place, and time. She appears well-developed and well-nourished. No distress.  HENT:  Head: Normocephalic and atraumatic.  Right Ear: External ear normal.  Left Ear: External ear normal.  Nose: Mucosal edema present. Right sinus exhibits maxillary sinus tenderness. Left sinus exhibits maxillary sinus tenderness.  Mouth/Throat: Oropharynx is clear and moist. No oropharyngeal exudate.  Eyes: Conjunctivae are normal. Pupils are equal, round, and reactive to light. Right eye exhibits no discharge. Left eye exhibits no discharge. No scleral icterus.  Neck: Normal range of motion. Neck supple. No tracheal deviation present. No thyromegaly present.  Cardiovascular: Normal rate, regular rhythm, normal heart sounds and intact distal pulses.  Exam reveals no gallop and no friction rub.   No murmur heard. Pulmonary/Chest: Effort normal and breath sounds normal. No respiratory distress. She has no wheezes. She has no rales. She exhibits no tenderness.  Musculoskeletal: Normal range of motion. She exhibits no edema and no tenderness.  Lymphadenopathy:    She has no cervical adenopathy.  Neurological: She is alert and oriented to person, place, and time. No cranial nerve deficit. She exhibits normal muscle tone. Coordination normal.  Skin: Skin is warm and dry. No rash noted. She is not diaphoretic. No erythema. No pallor.  Psychiatric: She has a normal mood and affect. Her behavior is normal. Judgment and thought content normal.          Assessment & Plan:

## 2013-01-07 NOTE — Assessment & Plan Note (Signed)
Patient notes some elevated blood sugars recently and some dietary indiscretion, however did not bring record of BG. Will check A1c with labs today. Continue current medications.

## 2013-01-07 NOTE — Assessment & Plan Note (Signed)
Intolerant of statin medications. Recent LDL 129. Goal <70. Encouraged healthy diet and regular physical activity with goal of weight loss.

## 2013-01-07 NOTE — Assessment & Plan Note (Signed)
Symptoms well controlled with alprazolam. We'll continue. 

## 2013-01-07 NOTE — Telephone Encounter (Signed)
Received the notice of approval from [facility] for the Pantoprazole Sodium Tablet approval from 02.03.2014-02.17.2015, will fax to pharmacy

## 2013-01-07 NOTE — Assessment & Plan Note (Signed)
BP Readings from Last 3 Encounters:  01/07/13 160/90  11/21/12 126/72  09/19/12 140/88   Blood pressure elevated today however patient had not taken medications. Encourage compliance with medication. She will monitor blood pressure at home and call if consistently greater than 140/90. Followup in 3 months or sooner if needed.

## 2013-01-07 NOTE — Assessment & Plan Note (Signed)
Symptoms consistent with early sinusitis. Encourage patient to try using nasal steroid spray and ibuprofen as needed for the next couple of days. If symptoms are not improving or are worsening, she will start Augmentin twice daily for 10 days.

## 2013-01-08 LAB — COMPREHENSIVE METABOLIC PANEL
ALT: 25 U/L (ref 0–35)
AST: 25 U/L (ref 0–37)
Albumin: 3.8 g/dL (ref 3.5–5.2)
Calcium: 9.3 mg/dL (ref 8.4–10.5)
Chloride: 102 mEq/L (ref 96–112)
Potassium: 4.1 mEq/L (ref 3.5–5.1)
Sodium: 137 mEq/L (ref 135–145)
Total Protein: 7.6 g/dL (ref 6.0–8.3)

## 2013-02-05 ENCOUNTER — Telehealth: Payer: Self-pay | Admitting: Internal Medicine

## 2013-02-05 NOTE — Telephone Encounter (Signed)
Pt states Dr. Dan Humphreys told her she could call occasionally to see if we have samples for Bystolic because her copay is so high.  Please advise pt at [phone] at work and you may leave msg on the work answering machine.

## 2013-02-05 NOTE — Telephone Encounter (Signed)
Ok to give samples

## 2013-02-05 NOTE — Telephone Encounter (Signed)
Sure. That is fine.

## 2013-02-05 NOTE — Telephone Encounter (Signed)
Left message on patient cell phone with information, work number was not in order. Samples ready for her to pick up

## 2013-02-11 ENCOUNTER — Other Ambulatory Visit: Payer: Self-pay | Admitting: *Deleted

## 2013-02-11 DIAGNOSIS — I10 Essential (primary) hypertension: Secondary | ICD-10-CM

## 2013-02-11 DIAGNOSIS — N63 Unspecified lump in unspecified breast: Secondary | ICD-10-CM

## 2013-02-11 DIAGNOSIS — E785 Hyperlipidemia, unspecified: Secondary | ICD-10-CM

## 2013-02-11 DIAGNOSIS — R300 Dysuria: Secondary | ICD-10-CM

## 2013-02-11 DIAGNOSIS — F419 Anxiety disorder, unspecified: Secondary | ICD-10-CM

## 2013-02-12 ENCOUNTER — Telehealth: Payer: Self-pay | Admitting: *Deleted

## 2013-02-12 MED ORDER — ALPRAZOLAM 0.25 MG PO TABS: 0.2500 mg | ORAL_TABLET | Freq: Three times a day (TID) | ORAL | Status: DC | PRN

## 2013-02-12 NOTE — Telephone Encounter (Signed)
Patient called office stating she was having problems getting her xanax filled. She would like for you to call her.

## 2013-02-13 NOTE — Telephone Encounter (Signed)
Left message to call back  

## 2013-02-13 NOTE — Telephone Encounter (Signed)
Patient returned call med refill is not at the pharmacy. I called her pharmacy, spoke with [place] and she confirmed that they did not have the refill. Archie Patten was given a verbal refill order (as our documentation indicates that the script for Xanax was called in on 3/36) Voice message left on patients cell phone as  requested that her refill has been taken care of.

## 2013-02-26 ENCOUNTER — Telehealth: Payer: Self-pay | Admitting: Internal Medicine

## 2013-03-26 ENCOUNTER — Telehealth: Payer: Self-pay | Admitting: Internal Medicine

## 2013-03-26 NOTE — Telephone Encounter (Signed)
Patient needing samples Bystolic 5 mg

## 2013-03-27 NOTE — Telephone Encounter (Signed)
Left 2 bottles up front for patient to pick up.

## 2013-04-03 ENCOUNTER — Ambulatory Visit: Payer: BC Managed Care – PPO | Admitting: Internal Medicine

## 2013-04-08 ENCOUNTER — Encounter: Payer: Self-pay | Admitting: Internal Medicine

## 2013-04-08 ENCOUNTER — Ambulatory Visit (INDEPENDENT_AMBULATORY_CARE_PROVIDER_SITE_OTHER): Payer: BC Managed Care – PPO | Admitting: Internal Medicine

## 2013-04-08 VITALS — BP 140/90 | HR 78 | Temp 98.6°F | Wt 196.0 lb

## 2013-04-08 DIAGNOSIS — L259 Unspecified contact dermatitis, unspecified cause: Secondary | ICD-10-CM

## 2013-04-08 DIAGNOSIS — E785 Hyperlipidemia, unspecified: Secondary | ICD-10-CM

## 2013-04-08 DIAGNOSIS — G4733 Obstructive sleep apnea (adult) (pediatric): Secondary | ICD-10-CM

## 2013-04-08 DIAGNOSIS — L309 Dermatitis, unspecified: Secondary | ICD-10-CM

## 2013-04-08 DIAGNOSIS — E669 Obesity, unspecified: Secondary | ICD-10-CM

## 2013-04-08 DIAGNOSIS — I10 Essential (primary) hypertension: Secondary | ICD-10-CM

## 2013-04-08 DIAGNOSIS — E119 Type 2 diabetes mellitus without complications: Secondary | ICD-10-CM

## 2013-04-08 DIAGNOSIS — IMO0001 Reserved for inherently not codable concepts without codable children: Secondary | ICD-10-CM

## 2013-04-08 LAB — COMPREHENSIVE METABOLIC PANEL
AST: 29 U/L (ref 0–37)
Albumin: 3.7 g/dL (ref 3.5–5.2)
BUN: 17 mg/dL (ref 6–23)
Calcium: 9 mg/dL (ref 8.4–10.5)
Chloride: 103 mEq/L (ref 96–112)
Creatinine, Ser: 0.8 mg/dL (ref 0.4–1.2)
GFR: 81.65 mL/min (ref >60.00)
Glucose, Bld: 110 mg/dL — ABNORMAL HIGH (ref 70–99)
Sodium: 137 mEq/L (ref 135–145)

## 2013-04-08 LAB — LIPID PANEL
Cholesterol: 198 mg/dL (ref 0–200)
HDL: 31 mg/dL — ABNORMAL LOW (ref >39.00)
Triglycerides: 203 mg/dL — ABNORMAL HIGH (ref 0.0–149.0)

## 2013-04-08 LAB — HEMOGLOBIN A1C: Hgb A1c MFr Bld: 7.1 % — ABNORMAL HIGH (ref 4.6–6.5)

## 2013-04-08 MED ORDER — CLOTRIMAZOLE-BETAMETHASONE 1-0.05 % EX CREA: TOPICAL_CREAM | Freq: Two times a day (BID) | CUTANEOUS | Status: AC

## 2013-04-08 MED ORDER — MELOXICAM 15 MG PO TABS: 15.0000 mg | ORAL_TABLET | Freq: Every day | ORAL | Status: DC

## 2013-04-08 NOTE — Assessment & Plan Note (Signed)
Intolerant of statins. Encouraged healthy diet and regular physical activity.

## 2013-04-08 NOTE — Assessment & Plan Note (Signed)
Wt Readings from Last 3 Encounters:  04/08/13 196 lb (88.905 kg)  01/07/13 193 lb 8 oz (87.771 kg)  11/21/12 195 lb (88.451 kg)   Body mass index is 36.44 kg/(m^2). Encouraged healthy diet, keeping a food diary. Encouraged exercise with goal of 5hr total per week.  Encouraged her to avoid fad diet programs such as "HCG" diet she had been considering.

## 2013-04-08 NOTE — Assessment & Plan Note (Signed)
BP Readings from Last 3 Encounters:  04/08/13 140/90  01/07/13 160/90  11/21/12 126/72   BP slightly elevated today, however pt reports better controlled at home. Intolerance to ACEi in the past. Will monitor BP. Consider starting Losartan if persistent elevation.

## 2013-04-08 NOTE — Progress Notes (Signed)
Subjective:    Patient ID: Jessica Houston, female    DOB: 07/16/1966, 47 y.o.   MRN: [id number]  HPI 47YO female with obesity, DM, HTN presents for follow up.  DM - Does not check BG. Does not follow a specific diet. Compliant with metformin.  HTN- BP typically 120-130/70-80s. No chest pain, palpitations, headache. Compliant with Bystolic.  Obesity - Not following a specific diet. Thinking about the "HCG" diet. Does not exercise.  Sleep apnea - Not currently using CPAP.  Outpatient Encounter Prescriptions as of 04/08/2013  Medication Sig Dispense Refill  . albuterol (PROVENTIL HFA;VENTOLIN HFA) 108 (90 BASE) MCG/ACT inhaler Inhale 2 puffs into the lungs every 6 (six) hours as needed for wheezing.  1 Inhaler  6  . ALPRAZolam (XANAX) 0.25 MG tablet Take 1 tablet (0.25 mg total) by mouth 3 (three) times daily as needed for anxiety.  90 tablet  0  . Calcium Carbonate Antacid (TUMS PO) Take 1 tablet by mouth as needed.        . fluticasone (FLONASE) 50 MCG/ACT nasal spray Place 2 sprays into the nose as needed.  16 g  11  . fluticasone (FLOVENT HFA) 110 MCG/ACT inhaler Inhale 1 puff into the lungs 2 (two) times daily.  1 Inhaler  12  . glucose blood (ONE TOUCH TEST STRIPS) test strip 1 each by Other route 2 (two) times daily as needed. DISPENSE STRIPS FOR NANO METER DX: 250.00  150 each  3  . Lancets (ONETOUCH ULTRASOFT) lancets 1 each by Other route 2 (two) times daily. Dispense one touch nano lancets  150 each  3  . metFORMIN (GLUCOPHAGE) 500 MG tablet Take 1 tablet (500 mg total) by mouth 2 (two) times daily with a meal.  180 tablet  3  . Multiple Vitamin (MULTIVITAMIN) capsule Take 1 capsule by mouth daily.        . nebivolol (BYSTOLIC) 5 MG tablet Take 1 tablet (5 mg total) by mouth daily.  30 tablet  0  . pantoprazole (PROTONIX) 40 MG tablet Take 1 tablet (40 mg total) by mouth daily.  30 tablet  6  . Vitamin D, Ergocalciferol, (DRISDOL) 50000 UNITS CAPS       . Cholecalciferol (VITAMIN  D PO) Take 1 tablet by mouth daily.        . clotrimazole-betamethasone (LOTRISONE) cream Apply topically 2 (two) times daily.  30 g  0  . Coenzyme Q10 (COQ10 PO) Take 1 tablet by mouth daily.        . Dermatological Products, Misc. (AURSTAT ANTI-ITCH HYDROGEL) KIT       . meloxicam (MOBIC) 15 MG tablet Take 1 tablet (15 mg total) by mouth daily.  30 tablet  0  . Omega-3 Fatty Acids (FISH OIL) 1000 MG CAPS Take 1 capsule by mouth daily.        . rosuvastatin (CRESTOR) 5 MG tablet Take 1 tablet (5 mg total) by mouth daily.  90 tablet  3  . [DISCONTINUED] amoxicillin-clavulanate (AUGMENTIN) 875-125 MG per tablet Take 1 tablet by mouth 2 (two) times daily.  20 tablet  0   No facility-administered encounter medications on file as of 04/08/2013.   BP 140/90  Pulse 78  Temp(Src) 98.6 F (37 C) (Oral)  Wt 196 lb (88.905 kg)  BMI 36.44 kg/m2  SpO2 98%  LMP 03/22/2013  Review of Systems  Constitutional: Positive for fatigue. Negative for fever, chills, appetite change and unexpected weight change.  HENT: Negative for ear pain, congestion, sore  throat, trouble swallowing, neck pain, voice change and sinus pressure.   Eyes: Negative for visual disturbance.  Respiratory: Negative for cough, shortness of breath, wheezing and stridor.   Cardiovascular: Negative for chest pain, palpitations and leg swelling.  Gastrointestinal: Negative for nausea, vomiting, abdominal pain, diarrhea, constipation, blood in stool, abdominal distention and anal bleeding.  Genitourinary: Negative for dysuria and flank pain.  Musculoskeletal: Negative for myalgias, arthralgias and gait problem.  Skin: Negative for color change and rash.  Neurological: Negative for dizziness and headaches.  Hematological: Negative for adenopathy. Does not bruise/bleed easily.  Psychiatric/Behavioral: Negative for suicidal ideas, sleep disturbance and dysphoric mood. The patient is not nervous/anxious.        Objective:   Physical Exam   Constitutional: She is oriented to person, place, and time. She appears well-developed and well-nourished. No distress.  HENT:  Head: Normocephalic and atraumatic.  Right Ear: External ear normal.  Left Ear: External ear normal.  Nose: Nose normal.  Mouth/Throat: Oropharynx is clear and moist. No oropharyngeal exudate.  Eyes: Conjunctivae are normal. Pupils are equal, round, and reactive to light. Right eye exhibits no discharge. Left eye exhibits no discharge. No scleral icterus.  Neck: Normal range of motion. Neck supple. No tracheal deviation present. No thyromegaly present.  Cardiovascular: Normal rate, regular rhythm, normal heart sounds and intact distal pulses.  Exam reveals no gallop and no friction rub.   No murmur heard. Pulmonary/Chest: Effort normal and breath sounds normal. No accessory muscle usage. Not tachypneic. No respiratory distress. She has no decreased breath sounds. She has no wheezes. She has no rhonchi. She has no rales. She exhibits no tenderness.  Musculoskeletal: Normal range of motion. She exhibits no edema and no tenderness.  Lymphadenopathy:    She has no cervical adenopathy.  Neurological: She is alert and oriented to person, place, and time. No cranial nerve deficit. She exhibits normal muscle tone. Coordination normal.  Skin: Skin is warm and dry. No rash noted. She is not diaphoretic. No erythema. No pallor.  Psychiatric: She has a normal mood and affect. Her behavior is normal. Judgment and thought content normal.          Assessment & Plan:

## 2013-04-08 NOTE — Assessment & Plan Note (Signed)
Pt non-compliant with CPAP. Encouraged better compliance.

## 2013-04-08 NOTE — Assessment & Plan Note (Signed)
Will check A1c with labs today. Encouraged pt to periodically check BG. Continue metformin.

## 2013-04-30 LAB — HM DIABETES EYE EXAM

## 2013-05-01 ENCOUNTER — Encounter: Payer: Self-pay | Admitting: Internal Medicine

## 2013-05-01 ENCOUNTER — Ambulatory Visit (INDEPENDENT_AMBULATORY_CARE_PROVIDER_SITE_OTHER): Payer: BC Managed Care – PPO | Admitting: Internal Medicine

## 2013-05-01 ENCOUNTER — Telehealth: Payer: Self-pay | Admitting: *Deleted

## 2013-05-01 VITALS — BP 170/98 | HR 82 | Temp 98.7°F | Wt 198.0 lb

## 2013-05-01 DIAGNOSIS — R609 Edema, unspecified: Secondary | ICD-10-CM

## 2013-05-01 DIAGNOSIS — I10 Essential (primary) hypertension: Secondary | ICD-10-CM

## 2013-05-01 DIAGNOSIS — R319 Hematuria, unspecified: Secondary | ICD-10-CM

## 2013-05-01 DIAGNOSIS — R079 Chest pain, unspecified: Secondary | ICD-10-CM

## 2013-05-01 LAB — POCT URINALYSIS DIPSTICK
Bilirubin, UA: NEGATIVE
Glucose, UA: NEGATIVE
Ketones, UA: NEGATIVE
Leukocytes, UA: NEGATIVE
Protein, UA: NEGATIVE
Spec Grav, UA: <=1.005

## 2013-05-01 MED ORDER — HYDROCHLOROTHIAZIDE 12.5 MG PO CAPS: 12.5000 mg | ORAL_CAPSULE | Freq: Every day | ORAL | Status: DC

## 2013-05-01 NOTE — Telephone Encounter (Signed)
Yes

## 2013-05-01 NOTE — Telephone Encounter (Signed)
Would you like a urine culture?  

## 2013-05-01 NOTE — Assessment & Plan Note (Signed)
Blood pressure markedly elevated today. However, patient extremely anxious. We'll have her monitor her blood pressure at home and will start hydrochlorothiazide 12.5 mg daily. Followup in 2 weeks.

## 2013-05-01 NOTE — Progress Notes (Signed)
Subjective:    Patient ID: Jessica Houston, female    DOB: [birth year], 47 y.o.   MRN: [id number]  HPI 47 year old female presents for acute visit complaining of "swelling all over. "She reports that over the last few days she has had increased swelling in her hands and feet. She has kept her feet propped up all day with some improvement. Initially, she denies increased intake of salty foods but on further questioning notes that she has recently been eating buffalo chicken wings and pizza. She denies shortness of breath. She did have one episode of chest pain radiating to her left arm. This resolved after a few seconds without intervention. She questions whether this may have been musculoskeletal from positioning of her left arm. Notably, she has had 2 stress tests in the past both of which have been normal. She denies any fever, chills. Blood sugars have been well-controlled, typically between 125 and 140.  Outpatient Encounter Prescriptions as of 05/01/2013  Medication Sig Dispense Refill  . albuterol (PROVENTIL HFA;VENTOLIN HFA) 108 (90 BASE) MCG/ACT inhaler Inhale 2 puffs into the lungs every 6 (six) hours as needed for wheezing.  1 Inhaler  6  . ALPRAZolam (XANAX) 0.25 MG tablet Take 1 tablet (0.25 mg total) by mouth 3 (three) times daily as needed for anxiety.  90 tablet  0  . Calcium Carbonate Antacid (TUMS PO) Take 1 tablet by mouth as needed.        . Cholecalciferol (VITAMIN D PO) Take 1 tablet by mouth daily.        . clotrimazole-betamethasone (LOTRISONE) cream Apply topically 2 (two) times daily.  30 g  0  . Coenzyme Q10 (COQ10 PO) Take 1 tablet by mouth daily.        . Dermatological Products, Misc. (AURSTAT ANTI-ITCH HYDROGEL) KIT       . fluticasone (FLONASE) 50 MCG/ACT nasal spray Place 2 sprays into the nose as needed.  16 g  11  . fluticasone (FLOVENT HFA) 110 MCG/ACT inhaler Inhale 1 puff into the lungs 2 (two) times daily.  1 Inhaler  12  . glucose blood (ONE TOUCH TEST STRIPS)  test strip 1 each by Other route 2 (two) times daily as needed. DISPENSE STRIPS FOR NANO METER DX: 250.00  150 each  3  . Lancets (ONETOUCH ULTRASOFT) lancets 1 each by Other route 2 (two) times daily. Dispense one touch nano lancets  150 each  3  . meloxicam (MOBIC) 15 MG tablet Take 1 tablet (15 mg total) by mouth daily.  30 tablet  0  . metFORMIN (GLUCOPHAGE) 500 MG tablet Take 1 tablet (500 mg total) by mouth 2 (two) times daily with a meal.  180 tablet  3  . Multiple Vitamin (MULTIVITAMIN) capsule Take 1 capsule by mouth daily.        . nebivolol (BYSTOLIC) 5 MG tablet Take 1 tablet (5 mg total) by mouth daily.  30 tablet  0  . Omega-3 Fatty Acids (FISH OIL) 1000 MG CAPS Take 1 capsule by mouth daily.        . pantoprazole (PROTONIX) 40 MG tablet Take 1 tablet (40 mg total) by mouth daily.  30 tablet  6  . Vitamin D, Ergocalciferol, (DRISDOL) 50000 UNITS CAPS       . rosuvastatin (CRESTOR) 5 MG tablet Take 1 tablet (5 mg total) by mouth daily.  90 tablet  3   No facility-administered encounter medications on file as of 05/01/2013.   BP 170/98  Pulse 82  Temp(Src) 98.7 F (37.1 C) (Oral)  Wt 198 lb (89.812 kg)  BMI 36.81 kg/m2  SpO2 98%  LMP 03/22/2013  Review of Systems  Constitutional: Negative for fever, chills, appetite change, fatigue and unexpected weight change.  HENT: Negative for ear pain, congestion, sore throat, trouble swallowing, neck pain, voice change and sinus pressure.   Eyes: Negative for visual disturbance.  Respiratory: Negative for cough, shortness of breath, wheezing and stridor.   Cardiovascular: Positive for chest pain and leg swelling. Negative for palpitations.  Gastrointestinal: Negative for nausea, vomiting, abdominal pain, diarrhea, constipation, blood in stool, abdominal distention and anal bleeding.  Genitourinary: Negative for dysuria and flank pain.  Musculoskeletal: Negative for myalgias, arthralgias and gait problem.  Skin: Negative for color  change and rash.  Neurological: Negative for dizziness and headaches.  Hematological: Negative for adenopathy. Does not bruise/bleed easily.  Psychiatric/Behavioral: Negative for suicidal ideas, sleep disturbance and dysphoric mood. The patient is not nervous/anxious.        Objective:   Physical Exam  Constitutional: She is oriented to person, place, and time. She appears well-developed and well-nourished. No distress.  HENT:  Head: Normocephalic and atraumatic.  Right Ear: External ear normal.  Left Ear: External ear normal.  Nose: Nose normal.  Mouth/Throat: Oropharynx is clear and moist. No oropharyngeal exudate.  Eyes: Conjunctivae are normal. Pupils are equal, round, and reactive to light. Right eye exhibits no discharge. Left eye exhibits no discharge. No scleral icterus.  Neck: Normal range of motion. Neck supple. No tracheal deviation present. No thyromegaly present.  Cardiovascular: Normal rate, regular rhythm, normal heart sounds and intact distal pulses.  Exam reveals no gallop and no friction rub.   No murmur heard. Pulmonary/Chest: Effort normal and breath sounds normal. No accessory muscle usage. Not tachypneic. No respiratory distress. She has no decreased breath sounds. She has no wheezes. She has no rhonchi. She has no rales. She exhibits no tenderness.  Musculoskeletal: Normal range of motion. She exhibits no edema (No edema noted in the lower extremities or hands) and no tenderness.  Lymphadenopathy:    She has no cervical adenopathy.  Neurological: She is alert and oriented to person, place, and time. No cranial nerve deficit. She exhibits normal muscle tone. Coordination normal.  Skin: Skin is warm and dry. No rash noted. She is not diaphoretic. No erythema. No pallor.  Psychiatric: She has a normal mood and affect. Her behavior is normal. Judgment and thought content normal.          Assessment & Plan:

## 2013-05-01 NOTE — Assessment & Plan Note (Signed)
Exam is normal today. Try to offer reassurance that recent increase in swelling in hands and feet was likely related to increased salt intake. Encouraged her to limit salt in her diet. Will start hydrochlorothiazide 12.5 mg daily to help with edema. We'll plan to have her return to clinic in one to 2 weeks to recheck symptoms and blood pressure.

## 2013-05-01 NOTE — Assessment & Plan Note (Signed)
Single episode of left-sided chest pain radiating to her arm. Symptoms are atypical for cardiac ischemia. Most consistent with muscular strain. EKG is normal today. Stress test x2 in the past for both normal. Recent echocardiogram was normal. Offered reassurance today. Will set her followup with her cardiologist.

## 2013-05-01 NOTE — Patient Instructions (Signed)
Start HCTZ 12.5mg  daily.  Follow up 2-4 weeks.

## 2013-05-03 LAB — URINE CULTURE: Colony Count: 2000

## 2013-05-12 LAB — HM DIABETES EYE EXAM

## 2013-05-20 ENCOUNTER — Other Ambulatory Visit: Payer: Self-pay | Admitting: *Deleted

## 2013-05-20 DIAGNOSIS — I10 Essential (primary) hypertension: Secondary | ICD-10-CM

## 2013-05-20 DIAGNOSIS — E785 Hyperlipidemia, unspecified: Secondary | ICD-10-CM

## 2013-05-20 DIAGNOSIS — N63 Unspecified lump in unspecified breast: Secondary | ICD-10-CM

## 2013-05-20 DIAGNOSIS — R300 Dysuria: Secondary | ICD-10-CM

## 2013-05-20 DIAGNOSIS — F419 Anxiety disorder, unspecified: Secondary | ICD-10-CM

## 2013-05-20 MED ORDER — ALPRAZOLAM 0.25 MG PO TABS: 0.2500 mg | ORAL_TABLET | Freq: Three times a day (TID) | ORAL | Status: DC | PRN

## 2013-05-21 ENCOUNTER — Telehealth: Payer: Self-pay | Admitting: Internal Medicine

## 2013-05-21 NOTE — Telephone Encounter (Signed)
Rx called in to pharmacy and left patient a voicemail on her phone

## 2013-05-21 NOTE — Telephone Encounter (Signed)
Pt calling to check on her xanax rx pt is completely out  And stated [facility] said they have not heard anything from dr walkers office

## 2013-05-28 ENCOUNTER — Telehealth: Payer: Self-pay | Admitting: Internal Medicine

## 2013-05-28 NOTE — Telephone Encounter (Signed)
FYI:  Has been to heart doctor like she was asked to.    Pt asking if she can come in to leave a urine specimen.  States this was discussed at her last visit and she cannot remember if Dr. Dan Humphreys told her to leave a urine.  States it tested +blood previously and pt wants to be checked for bladder infection because she has been going to the bathroom too much.  Please advise.  Please call her at work and just ask for [place].  Pt also asking about refill on her "fluid pill"

## 2013-05-28 NOTE — Telephone Encounter (Signed)
Left message to call back and schedule an appointment .

## 2013-05-28 NOTE — Telephone Encounter (Signed)
Pt needs to be seen if she thinks she has a urinary tract infection

## 2013-05-28 NOTE — Telephone Encounter (Signed)
Fwd to Dr. Walker 

## 2013-05-29 ENCOUNTER — Ambulatory Visit: Payer: BC Managed Care – PPO | Admitting: Internal Medicine

## 2013-05-29 NOTE — Telephone Encounter (Signed)
All she want to know if she needs to come in to leave another urine sample to follow up from last time?

## 2013-05-29 NOTE — Telephone Encounter (Signed)
Spoke with patient, stated she her message was misunderstood because she is not having any symptoms. Just wanted to know about leaving a sample because that is what Dr. Dan Humphreys usually has her do.

## 2013-05-29 NOTE — Telephone Encounter (Signed)
We can repeat a urine sample to check for blood, however if she is having symptoms of dysuria, then she should be seen.

## 2013-06-16 ENCOUNTER — Telehealth: Payer: Self-pay | Admitting: Internal Medicine

## 2013-06-16 NOTE — Telephone Encounter (Signed)
Needs foot and eye exam. Can you please get dates from her?

## 2013-06-18 NOTE — Telephone Encounter (Signed)
Left message to call back  

## 2013-06-19 NOTE — Telephone Encounter (Signed)
She is in the process of setting up her eye exam and has an appointment already schedule with you on 8/27.

## 2013-07-14 ENCOUNTER — Encounter: Payer: Self-pay | Admitting: *Deleted

## 2013-07-15 ENCOUNTER — Encounter: Payer: Self-pay | Admitting: Internal Medicine

## 2013-07-15 ENCOUNTER — Ambulatory Visit (INDEPENDENT_AMBULATORY_CARE_PROVIDER_SITE_OTHER): Payer: BC Managed Care – PPO | Admitting: Internal Medicine

## 2013-07-15 ENCOUNTER — Ambulatory Visit: Payer: BC Managed Care – PPO

## 2013-07-15 VITALS — BP 140/90 | HR 93 | Temp 98.4°F | Wt 196.0 lb

## 2013-07-15 DIAGNOSIS — E669 Obesity, unspecified: Secondary | ICD-10-CM

## 2013-07-15 DIAGNOSIS — E119 Type 2 diabetes mellitus without complications: Secondary | ICD-10-CM

## 2013-07-15 DIAGNOSIS — I10 Essential (primary) hypertension: Secondary | ICD-10-CM

## 2013-07-15 DIAGNOSIS — E785 Hyperlipidemia, unspecified: Secondary | ICD-10-CM

## 2013-07-15 LAB — URINALYSIS
Bilirubin Urine: NEGATIVE
Nitrite: NEGATIVE
Total Protein, Urine: NEGATIVE

## 2013-07-15 LAB — MICROALBUMIN / CREATININE URINE RATIO
Creatinine,U: 158.4 mg/dL
Microalb, Ur: 2.2 mg/dL — ABNORMAL HIGH (ref 0.0–1.9)

## 2013-07-15 NOTE — Progress Notes (Signed)
Subjective:    Patient ID: Jessica Houston, female    DOB: 08/04/1966, 47 y.o.   MRN: [id number]  HPI 47 year old female with history of diabetes, hypertension, obesity, hyperlipidemia presents for followup. She reports that she has not been following any specific diet. She notes some dietary indiscretion recently which she attributes to increased stress with her job. She notes it is been a difficult time for her with the death of her dog, her grandfather, and with moving her daughter to college. She feels that she is coping relatively well. She is not exercising. She reports fasting blood sugars have been near 135. She denies blood sugars less than 70 or greater than 200. She is compliant with medications, with the exception of Crestor which she typically takes once per week.  Outpatient Encounter Prescriptions as of 07/15/2013  Medication Sig Dispense Refill  . ALPRAZolam (XANAX) 0.25 MG tablet Take 1 tablet (0.25 mg total) by mouth 3 (three) times daily as needed for anxiety.  90 tablet  0  . Calcium Carbonate Antacid (TUMS PO) Take 1 tablet by mouth as needed.        . clotrimazole-betamethasone (LOTRISONE) cream Apply topically 2 (two) times daily.  30 g  0  . Coenzyme Q10 (COQ10 PO) Take 1 tablet by mouth daily.        . Dermatological Products, Misc. (AURSTAT ANTI-ITCH HYDROGEL) KIT       . fluticasone (FLONASE) 50 MCG/ACT nasal spray Place 2 sprays into the nose as needed.  16 g  11  . fluticasone (FLOVENT HFA) 110 MCG/ACT inhaler Inhale 1 puff into the lungs 2 (two) times daily.  1 Inhaler  12  . glucose blood (ONE TOUCH TEST STRIPS) test strip 1 each by Other route 2 (two) times daily as needed. DISPENSE STRIPS FOR NANO METER DX: 250.00  150 each  3  . hydrochlorothiazide (MICROZIDE) 12.5 MG capsule Take 1 capsule (12.5 mg total) by mouth daily.  30 capsule  11  . Lancets (ONETOUCH ULTRASOFT) lancets 1 each by Other route 2 (two) times daily. Dispense one touch nano lancets  150 each  3  .  metFORMIN (GLUCOPHAGE) 500 MG tablet Take 1 tablet (500 mg total) by mouth 2 (two) times daily with a meal.  180 tablet  3  . nebivolol (BYSTOLIC) 5 MG tablet Take 1 tablet (5 mg total) by mouth daily.  30 tablet  0  . pantoprazole (PROTONIX) 40 MG tablet Take 1 tablet (40 mg total) by mouth daily.  30 tablet  6  . Vitamin D, Ergocalciferol, (DRISDOL) 50000 UNITS CAPS       . albuterol (PROVENTIL HFA;VENTOLIN HFA) 108 (90 BASE) MCG/ACT inhaler Inhale 2 puffs into the lungs every 6 (six) hours as needed for wheezing.  1 Inhaler  6  . Cholecalciferol (VITAMIN D PO) Take 1 tablet by mouth daily.        . meloxicam (MOBIC) 15 MG tablet Take 1 tablet (15 mg total) by mouth daily.  30 tablet  0  . Multiple Vitamin (MULTIVITAMIN) capsule Take 1 capsule by mouth daily.        . Omega-3 Fatty Acids (FISH OIL) 1000 MG CAPS Take 1 capsule by mouth daily.        . rosuvastatin (CRESTOR) 5 MG tablet Take 1 tablet (5 mg total) by mouth daily.  90 tablet  3   No facility-administered encounter medications on file as of 07/15/2013.   BP 140/90  Pulse 93  Temp(Src) 98.4  F (36.9 C) (Oral)  Wt 196 lb (88.905 kg)  BMI 36.44 kg/m2  SpO2 97%  Review of Systems  Constitutional: Negative for fever, chills, appetite change, fatigue and unexpected weight change.  HENT: Negative for ear pain, congestion, sore throat, trouble swallowing, neck pain, voice change and sinus pressure.   Eyes: Negative for visual disturbance.  Respiratory: Negative for cough, shortness of breath, wheezing and stridor.   Cardiovascular: Negative for chest pain, palpitations and leg swelling.  Gastrointestinal: Negative for nausea, vomiting, abdominal pain, diarrhea, constipation, blood in stool, abdominal distention and anal bleeding.  Genitourinary: Negative for dysuria and flank pain.  Musculoskeletal: Negative for myalgias, arthralgias and gait problem.  Skin: Negative for color change and rash.  Neurological: Negative for dizziness  and headaches.  Hematological: Negative for adenopathy. Does not bruise/bleed easily.  Psychiatric/Behavioral: Negative for suicidal ideas, sleep disturbance and dysphoric mood. The patient is not nervous/anxious.        Objective:   Physical Exam  Constitutional: She is oriented to person, place, and time. She appears well-developed and well-nourished. No distress.  HENT:  Head: Normocephalic and atraumatic.  Right Ear: External ear normal.  Left Ear: External ear normal.  Nose: Nose normal.  Mouth/Throat: Oropharynx is clear and moist. No oropharyngeal exudate.  Eyes: Conjunctivae are normal. Pupils are equal, round, and reactive to light. Right eye exhibits no discharge. Left eye exhibits no discharge. No scleral icterus.  Neck: Normal range of motion. Neck supple. No tracheal deviation present. No thyromegaly present.  Cardiovascular: Normal rate, regular rhythm, normal heart sounds and intact distal pulses.  Exam reveals no gallop and no friction rub.   No murmur heard. Pulmonary/Chest: Effort normal and breath sounds normal. No accessory muscle usage. Not tachypneic. No respiratory distress. She has no decreased breath sounds. She has no wheezes. She has no rhonchi. She has no rales. She exhibits no tenderness.  Musculoskeletal: Normal range of motion. She exhibits no edema and no tenderness.  Lymphadenopathy:    She has no cervical adenopathy.  Neurological: She is alert and oriented to person, place, and time. No cranial nerve deficit. She exhibits normal muscle tone. Coordination normal.  Skin: Skin is warm and dry. No rash noted. She is not diaphoretic. No erythema. No pallor.  Psychiatric: She has a normal mood and affect. Her behavior is normal. Judgment and thought content normal.          Assessment & Plan:

## 2013-07-15 NOTE — Assessment & Plan Note (Signed)
BP Readings from Last 3 Encounters:  07/15/13 140/90  05/01/13 170/98  04/08/13 140/90   BP slightly elevated today. Encouraged compliance with BP meds. Note possible allergy to lisinopril in the past, will try to get records on this. Will check urine microalbumin and CMP today.

## 2013-07-15 NOTE — Assessment & Plan Note (Signed)
Wt Readings from Last 3 Encounters:  07/15/13 196 lb (88.905 kg)  05/01/13 198 lb (89.812 kg)  04/08/13 196 lb (88.905 kg)   Encouraged better compliance with low glycemic index diet and regular physical activity with goal of 30 minutes everyday of the week.

## 2013-07-15 NOTE — Assessment & Plan Note (Signed)
Blood sugars well controlled per pt report. Will check A1c with labs today. Continue Metformin. Encouraged better compliance with low glycemic index diet and regular exercise with goal of 5 days per week.

## 2013-07-16 LAB — COMPREHENSIVE METABOLIC PANEL
AST: 29 U/L (ref 0–37)
Alkaline Phosphatase: 49 U/L (ref 39–117)
BUN: 18 mg/dL (ref 6–23)
Calcium: 9.1 mg/dL (ref 8.4–10.5)
Chloride: 100 mEq/L (ref 96–112)
Creatinine, Ser: 0.8 mg/dL (ref 0.4–1.2)
Total Bilirubin: 0.4 mg/dL (ref 0.3–1.2)

## 2013-07-17 ENCOUNTER — Telehealth: Payer: Self-pay | Admitting: Internal Medicine

## 2013-07-17 MED ORDER — SAXAGLIPTIN HCL 2.5 MG PO TABS: 2.5000 mg | ORAL_TABLET | ORAL | Status: DC

## 2013-07-17 NOTE — Telephone Encounter (Signed)
Spoke with patient, she stated she was calling because she saw on her Mychart you wanted her to start a new medication for her glucose.

## 2013-07-17 NOTE — Telephone Encounter (Signed)
Pt states she has now left work.  Wanted to know if she could come in this afternoon to see Dr. Dan Humphreys.  Advised pt no cancellations on Dr. Tilman Neat schedule at this time.  Pt states since she was just here 8/27 she wondered if Dr. Dan Humphreys could just call the new med in for her or if this can be discussed over the phone.  Please contact patient on cell phone.  Pt states again that she cannot come back in in the next several weeks due to training at work.

## 2013-07-17 NOTE — Telephone Encounter (Signed)
Pt called stated that dr walker sent her a my chart message wanting her to come back in to change her meds.  Pt wanted to come in today she stated for the next 2 weeks at work she has training and cannot come in Please advise

## 2013-07-17 NOTE — Telephone Encounter (Signed)
I would like to start her on Onglyza. If she is having trouble getting in to the office because of work, we could call in Onglyza 2.5mg  daily for her to take every morning #30.  We should let her know that she will need to monitor BG carefully 1-2 times per day when starting this medication, and call if any BG<70. We should also let her know that this medication can cause some nausea. We should set up 2-4 week follow up.

## 2013-07-17 NOTE — Telephone Encounter (Signed)
Patient informed and verbalized understanding

## 2013-07-21 ENCOUNTER — Other Ambulatory Visit: Payer: Self-pay | Admitting: *Deleted

## 2013-07-21 MED ORDER — GLUCOSE BLOOD VI STRP: ORAL_STRIP | Status: DC

## 2013-07-21 MED ORDER — FREESTYLE FREEDOM LITE W/DEVICE KIT: PACK | Status: DC

## 2013-07-21 MED ORDER — GLUCOSE BLOOD VI STRP: 1.0000 | ORAL_STRIP | Freq: Two times a day (BID) | Status: DC | PRN

## 2013-07-21 NOTE — Telephone Encounter (Signed)
Patient called requesting test strips for her meter, however she is in need of a new meter. Would like prescription for strips sent to pharmacy and for a new meter. Wanted to see if her new insurance would pay for meter of choice, requested for Freestyle Light to be sent to pharmacy. Rx for meter and strips for Freestyle sent to pharmacy as well as strips for old meter she already has at home.

## 2013-07-24 ENCOUNTER — Telehealth: Payer: Self-pay | Admitting: Internal Medicine

## 2013-07-24 NOTE — Telephone Encounter (Signed)
States pharmacy [facility] is waiting for prior authorization for pt test strips x2 days.  Pt states she is completely out.  Free Style Lite strips needed and she needs to know what the cost will be.  Asking if we have any supplies here in the office she can use because she is completely out.  Pt states she has discussed this previously and is available to discuss again if needed.  She will use [facility] brand strips if needed.

## 2013-07-27 ENCOUNTER — Encounter: Payer: Self-pay | Admitting: Internal Medicine

## 2013-07-27 NOTE — Telephone Encounter (Signed)
Did you receive a PA on this patient?

## 2013-07-28 NOTE — Telephone Encounter (Signed)
Called pt's pharmacy to get PA phone number; Called [phone] for prior authorization on the test strip, form is being faxed over now   Cover My Meds

## 2013-07-28 NOTE — Telephone Encounter (Signed)
Received PA request form for the test strips place in  Dr.Walkers folder

## 2013-07-29 ENCOUNTER — Telehealth: Payer: Self-pay | Admitting: Internal Medicine

## 2013-07-29 DIAGNOSIS — K219 Gastro-esophageal reflux disease without esophagitis: Secondary | ICD-10-CM

## 2013-07-29 MED ORDER — PANTOPRAZOLE SODIUM 40 MG PO TBEC: 40.0000 mg | DELAYED_RELEASE_TABLET | Freq: Every day | ORAL | Status: DC

## 2013-07-29 NOTE — Telephone Encounter (Signed)
Pt spoke with pharmacy and states her rx for Protonix has expired.  Also needs test strips, has been out for almost two weeks.  Has started new medications and needs to be able to check her sugars.

## 2013-07-29 NOTE — Telephone Encounter (Signed)
Rx sent 

## 2013-07-30 ENCOUNTER — Other Ambulatory Visit: Payer: Self-pay | Admitting: *Deleted

## 2013-07-30 ENCOUNTER — Telehealth: Payer: Self-pay | Admitting: *Deleted

## 2013-07-30 MED ORDER — UNABLE TO FIND: Status: AC

## 2013-07-30 NOTE — Telephone Encounter (Signed)
Left detailed message on patient voicemail informing her Protonix was faxed to pharmacy and PA for strips has also been faxed and that usually takes a couple days.

## 2013-07-30 NOTE — Telephone Encounter (Signed)
Received a fax from [facility] lite strips have been APPROVED

## 2013-07-30 NOTE — Telephone Encounter (Signed)
Faxed to [employer]

## 2013-07-30 NOTE — Telephone Encounter (Signed)
Patient called and state her insurance company will pay for the following test strips, she called and spoke with someone at her insurance company. Send in One Touch Ultra Mini to [facility]

## 2013-08-12 ENCOUNTER — Other Ambulatory Visit: Payer: Self-pay | Admitting: *Deleted

## 2013-08-12 DIAGNOSIS — I10 Essential (primary) hypertension: Secondary | ICD-10-CM

## 2013-08-12 DIAGNOSIS — R300 Dysuria: Secondary | ICD-10-CM

## 2013-08-12 DIAGNOSIS — F419 Anxiety disorder, unspecified: Secondary | ICD-10-CM

## 2013-08-12 DIAGNOSIS — N63 Unspecified lump in unspecified breast: Secondary | ICD-10-CM

## 2013-08-12 DIAGNOSIS — E785 Hyperlipidemia, unspecified: Secondary | ICD-10-CM

## 2013-08-12 MED ORDER — ALPRAZOLAM 0.25 MG PO TABS: 0.2500 mg | ORAL_TABLET | Freq: Three times a day (TID) | ORAL | Status: DC | PRN

## 2013-08-12 NOTE — Telephone Encounter (Signed)
Ok to refill? Last OV 07/15/13

## 2013-08-17 ENCOUNTER — Ambulatory Visit (INDEPENDENT_AMBULATORY_CARE_PROVIDER_SITE_OTHER): Payer: BC Managed Care – PPO | Admitting: Internal Medicine

## 2013-08-17 ENCOUNTER — Encounter: Payer: Self-pay | Admitting: Internal Medicine

## 2013-08-17 VITALS — BP 140/82 | HR 82 | Temp 98.8°F | Wt 195.0 lb

## 2013-08-17 DIAGNOSIS — N951 Menopausal and female climacteric states: Secondary | ICD-10-CM | POA: Insufficient documentation

## 2013-08-17 DIAGNOSIS — E119 Type 2 diabetes mellitus without complications: Secondary | ICD-10-CM

## 2013-08-17 DIAGNOSIS — F419 Anxiety disorder, unspecified: Secondary | ICD-10-CM

## 2013-08-17 DIAGNOSIS — E785 Hyperlipidemia, unspecified: Secondary | ICD-10-CM

## 2013-08-17 DIAGNOSIS — I10 Essential (primary) hypertension: Secondary | ICD-10-CM

## 2013-08-17 DIAGNOSIS — E669 Obesity, unspecified: Secondary | ICD-10-CM

## 2013-08-17 DIAGNOSIS — F411 Generalized anxiety disorder: Secondary | ICD-10-CM

## 2013-08-17 DIAGNOSIS — R300 Dysuria: Secondary | ICD-10-CM

## 2013-08-17 MED ORDER — SAXAGLIPTIN HCL 5 MG PO TABS: 5.0000 mg | ORAL_TABLET | Freq: Every day | ORAL | Status: DC

## 2013-08-17 MED ORDER — ALPRAZOLAM 0.25 MG PO TABS: 0.2500 mg | ORAL_TABLET | Freq: Three times a day (TID) | ORAL | Status: DC | PRN

## 2013-08-17 NOTE — Assessment & Plan Note (Signed)
We discussed that irregular menses may be secondary to anovulatory cycles. Will continue to monitor for now.

## 2013-08-17 NOTE — Progress Notes (Signed)
Subjective:    Patient ID: Jessica Houston, female    DOB: September 30, 1966, 47 y.o.   MRN: [id number]  HPI 47YO female with h/o DM, obesity, asthma presents for follow up. At last visit, A1c noted to be more elevated than previous at 7.4%. She was started on Onglyza. Tolerating well. No nausea, vomiting. Has occasional RUQ abdominal pain, however this is chronic for her. No change in appetite or bowel habits. Recent BG near 130-160 fasting. Notes some dietary indiscretion with intake of high sugar foods. No BG<70.  Also concerned about some irregular menses, with some periods heavier than others. No abdominal pain, clotting. Questions if this may be related to menopause.  Outpatient Encounter Prescriptions as of 08/17/2013  Medication Sig Dispense Refill  . albuterol (PROVENTIL HFA;VENTOLIN HFA) 108 (90 BASE) MCG/ACT inhaler Inhale 2 puffs into the lungs every 6 (six) hours as needed for wheezing.  1 Inhaler  6  . ALPRAZolam (XANAX) 0.25 MG tablet Take 1 tablet (0.25 mg total) by mouth 3 (three) times daily as needed for anxiety.  90 tablet  3  . Blood Glucose Monitoring Suppl (FREESTYLE FREEDOM LITE) W/DEVICE KIT Please dispense home testing glucometer for patient to use at home. Testing 1-2 times a day. Dx code 250.0  1 each  0  . Calcium Carbonate Antacid (TUMS PO) Take 1 tablet by mouth as needed.        . Cholecalciferol (VITAMIN D PO) Take 1 tablet by mouth daily.        . clotrimazole-betamethasone (LOTRISONE) cream Apply topically 2 (two) times daily.  30 g  0  . Coenzyme Q10 (COQ10 PO) Take 1 tablet by mouth daily.        . Dermatological Products, Misc. (AURSTAT ANTI-ITCH HYDROGEL) KIT       . fluticasone (FLONASE) 50 MCG/ACT nasal spray Place 2 sprays into the nose as needed.  16 g  11  . fluticasone (FLOVENT HFA) 110 MCG/ACT inhaler Inhale 1 puff into the lungs 2 (two) times daily.  1 Inhaler  12  . glucose blood (FREESTYLE LITE) test strip Patient is testing at home 1-2 times daily. Dx  code 250.0  100 each  12  . glucose blood (ONE TOUCH TEST STRIPS) test strip 1 each by Other route 2 (two) times daily as needed. DISPENSE STRIPS FOR NANO METER DX: 250.00  150 each  3  . hydrochlorothiazide (MICROZIDE) 12.5 MG capsule Take 1 capsule (12.5 mg total) by mouth daily.  30 capsule  11  . Lancets (ONETOUCH ULTRASOFT) lancets 1 each by Other route 2 (two) times daily. Dispense one touch nano lancets  150 each  3  . meloxicam (MOBIC) 15 MG tablet Take 1 tablet (15 mg total) by mouth daily.  30 tablet  0  . metFORMIN (GLUCOPHAGE) 500 MG tablet Take 1 tablet (500 mg total) by mouth 2 (two) times daily with a meal.  180 tablet  3  . Multiple Vitamin (MULTIVITAMIN) capsule Take 1 capsule by mouth daily.        . nebivolol (BYSTOLIC) 5 MG tablet Take 1 tablet (5 mg total) by mouth daily.  30 tablet  0  . Omega-3 Fatty Acids (FISH OIL) 1000 MG CAPS Take 1 capsule by mouth daily.        . pantoprazole (PROTONIX) 40 MG tablet Take 1 tablet (40 mg total) by mouth daily.  30 tablet  5  . UNABLE TO FIND One Touch Ultra Mini Test strips for patient to use  with home machine. Patient is testing blood sugars at home 2 times daily. Dx code 250.00  100 each  6  . Vitamin D, Ergocalciferol, (DRISDOL) 50000 UNITS CAPS       . [DISCONTINUED] ALPRAZolam (XANAX) 0.25 MG tablet Take 1 tablet (0.25 mg total) by mouth 3 (three) times daily as needed for anxiety.  90 tablet  0  . [DISCONTINUED] saxagliptin HCl (ONGLYZA) 2.5 MG TABS tablet Take 1 tablet (2.5 mg total) by mouth every morning.  30 tablet  0  . rosuvastatin (CRESTOR) 5 MG tablet Take 1 tablet (5 mg total) by mouth daily.  90 tablet  3  . saxagliptin HCl (ONGLYZA) 5 MG TABS tablet Take 1 tablet (5 mg total) by mouth daily.  30 tablet  3   No facility-administered encounter medications on file as of 08/17/2013.   BP 140/82  Pulse 82  Temp(Src) 98.8 F (37.1 C) (Oral)  Wt 195 lb (88.451 kg)  BMI 36.25 kg/m2  SpO2 97%  Review of Systems   Constitutional: Negative for fever, chills, appetite change, fatigue and unexpected weight change.  HENT: Negative for ear pain, congestion, sore throat, trouble swallowing, neck pain, voice change and sinus pressure.   Eyes: Negative for visual disturbance.  Respiratory: Negative for cough, shortness of breath, wheezing and stridor.   Cardiovascular: Negative for chest pain, palpitations and leg swelling.  Gastrointestinal: Negative for nausea, vomiting, abdominal pain, diarrhea, constipation, blood in stool, abdominal distention and anal bleeding.  Genitourinary: Positive for menstrual problem. Negative for dysuria and flank pain.  Musculoskeletal: Negative for myalgias, arthralgias and gait problem.  Skin: Negative for color change and rash.  Neurological: Negative for dizziness and headaches.  Hematological: Negative for adenopathy. Does not bruise/bleed easily.  Psychiatric/Behavioral: Negative for suicidal ideas, sleep disturbance and dysphoric mood. The patient is not nervous/anxious.        Objective:   Physical Exam  Constitutional: She is oriented to person, place, and time. She appears well-developed and well-nourished. No distress.  HENT:  Head: Normocephalic and atraumatic.  Right Ear: External ear normal.  Left Ear: External ear normal.  Nose: Nose normal.  Mouth/Throat: Oropharynx is clear and moist. No oropharyngeal exudate.  Eyes: Conjunctivae are normal. Pupils are equal, round, and reactive to light. Right eye exhibits no discharge. Left eye exhibits no discharge. No scleral icterus.  Neck: Normal range of motion. Neck supple. No tracheal deviation present. No thyromegaly present.  Cardiovascular: Normal rate, regular rhythm, normal heart sounds and intact distal pulses.  Exam reveals no gallop and no friction rub.   No murmur heard. Pulmonary/Chest: Effort normal and breath sounds normal. No accessory muscle usage. Not tachypneic. No respiratory distress. She has no  decreased breath sounds. She has no wheezes. She has no rhonchi. She has no rales. She exhibits no tenderness.  Musculoskeletal: Normal range of motion. She exhibits no edema and no tenderness.  Lymphadenopathy:    She has no cervical adenopathy.  Neurological: She is alert and oriented to person, place, and time. No cranial nerve deficit. She exhibits normal muscle tone. Coordination normal.  Skin: Skin is warm and dry. No rash noted. She is not diaphoretic. No erythema. No pallor.  Psychiatric: She has a normal mood and affect. Her behavior is normal. Judgment and thought content normal.          Assessment & Plan:

## 2013-08-17 NOTE — Assessment & Plan Note (Signed)
Blood sugars continue to be elevated despite addition of Onglyza 2.5mg  daily. Will increase to 5mg  daily. Encouraged healthy diet and regular physical activity. Plan repeat A1c end of 09/2013. She will call if any questions or concerns.

## 2013-08-17 NOTE — Assessment & Plan Note (Signed)
Wt Readings from Last 3 Encounters:  08/17/13 195 lb (88.451 kg)  07/15/13 196 lb (88.905 kg)  05/01/13 198 lb (89.812 kg)   Encouraged healthy diet and regular physical activity. Discussed that Onglyza may cause some appetite suppression.

## 2013-08-18 ENCOUNTER — Telehealth: Payer: Self-pay | Admitting: Internal Medicine

## 2013-08-18 NOTE — Telephone Encounter (Signed)
Pt was seen in office yesterday, 9/29.  Pt states her employer is asking for note stating that she was seen here in the office.  Asking this to be faxed to [phone], Attn Carlus Pavlov

## 2013-08-19 ENCOUNTER — Encounter: Payer: Self-pay | Admitting: *Deleted

## 2013-08-19 NOTE — Telephone Encounter (Signed)
Note faxed today

## 2013-08-27 ENCOUNTER — Encounter: Payer: Self-pay | Admitting: Internal Medicine

## 2013-09-03 ENCOUNTER — Telehealth: Payer: Self-pay | Admitting: Internal Medicine

## 2013-09-03 NOTE — Telephone Encounter (Signed)
Left detailed message on patient voicemail stating not to stop taking Onglyza and please call the office to schedule an appointment.

## 2013-09-03 NOTE — Telephone Encounter (Signed)
Patient states she left message yesterday in reference to her sugars still running high in the morning. She said they range between 150-160, she would like to know if she should stop the onglyza since before she started taking this medication her sugars were in the 130's. Please advise.

## 2013-09-03 NOTE — Telephone Encounter (Signed)
No, she should not stop the Onglyza. We should see her in a visit.

## 2013-09-04 ENCOUNTER — Ambulatory Visit (INDEPENDENT_AMBULATORY_CARE_PROVIDER_SITE_OTHER): Payer: BC Managed Care – PPO | Admitting: Internal Medicine

## 2013-09-04 ENCOUNTER — Encounter: Payer: Self-pay | Admitting: Internal Medicine

## 2013-09-04 VITALS — BP 146/80 | HR 82 | Temp 98.5°F | Wt 194.0 lb

## 2013-09-04 DIAGNOSIS — E669 Obesity, unspecified: Secondary | ICD-10-CM

## 2013-09-04 DIAGNOSIS — L659 Nonscarring hair loss, unspecified: Secondary | ICD-10-CM

## 2013-09-04 DIAGNOSIS — E119 Type 2 diabetes mellitus without complications: Secondary | ICD-10-CM

## 2013-09-04 MED ORDER — METFORMIN HCL 500 MG PO TABS: 1000.0000 mg | ORAL_TABLET | Freq: Two times a day (BID) | ORAL | Status: DC

## 2013-09-04 NOTE — Telephone Encounter (Signed)
Patient called back and is scheduled to be seen by Dr. Dan Humphreys today

## 2013-09-04 NOTE — Progress Notes (Signed)
Subjective:    Patient ID: Jessica Houston, female    DOB: Jan 20, 1966, 47 y.o.   MRN: [id number]  HPI 47 year old female with history of diabetes, obesity, hypertension presents for acute visit. She was recently started on Onglyza. She reports that ever since starting this medication her blood sugars seem to have increased. She brings record of blood sugars today which show most fasting sugars between 100-160. Prior to starting medication most of her blood sugars were closer to 100. She also has noted that her hair seems to be ending. She questions whether this may be a side effect of the medication.  Outpatient Prescriptions Prior to Visit  Medication Sig Dispense Refill  . albuterol (PROVENTIL HFA;VENTOLIN HFA) 108 (90 BASE) MCG/ACT inhaler Inhale 2 puffs into the lungs every 6 (six) hours as needed for wheezing.  1 Inhaler  6  . ALPRAZolam (XANAX) 0.25 MG tablet Take 1 tablet (0.25 mg total) by mouth 3 (three) times daily as needed for anxiety.  90 tablet  3  . Blood Glucose Monitoring Suppl (FREESTYLE FREEDOM LITE) W/DEVICE KIT Please dispense home testing glucometer for patient to use at home. Testing 1-2 times a day. Dx code 250.0  1 each  0  . Calcium Carbonate Antacid (TUMS PO) Take 1 tablet by mouth as needed.        . Cholecalciferol (VITAMIN D PO) Take 1 tablet by mouth daily.        . clotrimazole-betamethasone (LOTRISONE) cream Apply topically 2 (two) times daily.  30 g  0  . Coenzyme Q10 (COQ10 PO) Take 1 tablet by mouth daily.        . Dermatological Products, Misc. (AURSTAT ANTI-ITCH HYDROGEL) KIT       . fluticasone (FLONASE) 50 MCG/ACT nasal spray Place 2 sprays into the nose as needed.  16 g  11  . fluticasone (FLOVENT HFA) 110 MCG/ACT inhaler Inhale 1 puff into the lungs 2 (two) times daily.  1 Inhaler  12  . glucose blood (FREESTYLE LITE) test strip Patient is testing at home 1-2 times daily. Dx code 250.0  100 each  12  . glucose blood (ONE TOUCH TEST STRIPS) test strip 1  each by Other route 2 (two) times daily as needed. DISPENSE STRIPS FOR NANO METER DX: 250.00  150 each  3  . hydrochlorothiazide (MICROZIDE) 12.5 MG capsule Take 1 capsule (12.5 mg total) by mouth daily.  30 capsule  11  . Lancets (ONETOUCH ULTRASOFT) lancets 1 each by Other route 2 (two) times daily. Dispense one touch nano lancets  150 each  3  . meloxicam (MOBIC) 15 MG tablet Take 1 tablet (15 mg total) by mouth daily.  30 tablet  0  . Multiple Vitamin (MULTIVITAMIN) capsule Take 1 capsule by mouth daily.        . nebivolol (BYSTOLIC) 5 MG tablet Take 1 tablet (5 mg total) by mouth daily.  30 tablet  0  . Omega-3 Fatty Acids (FISH OIL) 1000 MG CAPS Take 1 capsule by mouth daily.        . pantoprazole (PROTONIX) 40 MG tablet Take 1 tablet (40 mg total) by mouth daily.  30 tablet  5  . rosuvastatin (CRESTOR) 5 MG tablet Take 1 tablet (5 mg total) by mouth daily.  90 tablet  3  . UNABLE TO FIND One Touch Ultra Mini Test strips for patient to use with home machine. Patient is testing blood sugars at home 2 times daily. Dx code 250.00  100  each  6  . Vitamin D, Ergocalciferol, (DRISDOL) 50000 UNITS CAPS       . metFORMIN (GLUCOPHAGE) 500 MG tablet Take 1 tablet (500 mg total) by mouth 2 (two) times daily with a meal.  180 tablet  3  . saxagliptin HCl (ONGLYZA) 5 MG TABS tablet Take 1 tablet (5 mg total) by mouth daily.  30 tablet  3   No facility-administered medications prior to visit.   BP 146/80  Pulse 82  Temp(Src) 98.5 F (36.9 C) (Oral)  Wt 194 lb (87.998 kg)  BMI 36.07 kg/m2  SpO2 98%  Review of Systems  Constitutional: Negative for fever, chills, appetite change, fatigue and unexpected weight change.  HENT: Negative for congestion, ear pain, sinus pressure, sore throat, trouble swallowing and voice change.   Eyes: Negative for visual disturbance.  Respiratory: Negative for cough, shortness of breath, wheezing and stridor.   Cardiovascular: Negative for chest pain, palpitations and  leg swelling.  Gastrointestinal: Negative for nausea, vomiting, abdominal pain, diarrhea, constipation, blood in stool, abdominal distention and anal bleeding.  Genitourinary: Negative for dysuria and flank pain.  Musculoskeletal: Negative for arthralgias, gait problem, myalgias and neck pain.  Skin: Negative for color change and rash.  Neurological: Negative for dizziness and headaches.  Hematological: Negative for adenopathy. Does not bruise/bleed easily.  Psychiatric/Behavioral: Negative for suicidal ideas, sleep disturbance and dysphoric mood. The patient is not nervous/anxious.        Objective:   Physical Exam  Constitutional: She is oriented to person, place, and time. She appears well-developed and well-nourished. No distress.  HENT:  Head: Normocephalic and atraumatic.  Right Ear: External ear normal.  Left Ear: External ear normal.  Nose: Nose normal.  Mouth/Throat: Oropharynx is clear and moist. No oropharyngeal exudate.  Eyes: Conjunctivae are normal. Pupils are equal, round, and reactive to light. Right eye exhibits no discharge. Left eye exhibits no discharge. No scleral icterus.  Neck: Normal range of motion. Neck supple. No tracheal deviation present. No thyromegaly present.  Cardiovascular: Normal rate, regular rhythm, normal heart sounds and intact distal pulses.  Exam reveals no gallop and no friction rub.   No murmur heard. Pulmonary/Chest: Effort normal and breath sounds normal. No accessory muscle usage. Not tachypneic. No respiratory distress. She has no decreased breath sounds. She has no wheezes. She has no rhonchi. She has no rales. She exhibits no tenderness.  Musculoskeletal: Normal range of motion. She exhibits no edema and no tenderness.  Lymphadenopathy:    She has no cervical adenopathy.  Neurological: She is alert and oriented to person, place, and time. No cranial nerve deficit. She exhibits normal muscle tone. Coordination normal.  Skin: Skin is warm  and dry. No rash noted. She is not diaphoretic. No erythema. No pallor.  Psychiatric: She has a normal mood and affect. Her behavior is normal. Judgment and thought content normal.          Assessment & Plan:

## 2013-09-04 NOTE — Assessment & Plan Note (Signed)
Wt Readings from Last 3 Encounters:  09/04/13 194 lb (87.998 kg)  08/17/13 195 lb (88.451 kg)  07/15/13 196 lb (88.905 kg)   Encouraged healthy diet, low in processed sugars with goal of better BG control, and regular physical activity.

## 2013-09-04 NOTE — Assessment & Plan Note (Signed)
Pt is concerned about hair loss and thinning of hair. Will check thyroid function with labs today.

## 2013-09-04 NOTE — Assessment & Plan Note (Signed)
Recent blood sugars have been more elevated on Onglyza. We discussed that this is most likely related to progression of disease rather than side effect of the medication. We discussed that she may eventually need injectable medication. She would like to try increased dose of Metformin. Will try 1000mg  metformin bid over next few weeks, and plan to repeat A1c in 2 months.

## 2013-09-11 ENCOUNTER — Ambulatory Visit: Payer: Self-pay | Admitting: Internal Medicine

## 2013-09-18 ENCOUNTER — Telehealth: Payer: Self-pay | Admitting: Emergency Medicine

## 2013-09-18 NOTE — Telephone Encounter (Signed)
Patient called requesting samples on nebivolol (BYSTOLIC) 5 MG tablet, yesterday afternoon. I did not see any back there. Told her if something changed I would call her back.

## 2013-09-29 ENCOUNTER — Encounter: Payer: Self-pay | Admitting: Internal Medicine

## 2013-10-01 ENCOUNTER — Telehealth: Payer: Self-pay | Admitting: Internal Medicine

## 2013-10-06 ENCOUNTER — Ambulatory Visit: Payer: BC Managed Care – PPO | Admitting: Internal Medicine

## 2013-10-14 ENCOUNTER — Other Ambulatory Visit (INDEPENDENT_AMBULATORY_CARE_PROVIDER_SITE_OTHER): Payer: BC Managed Care – PPO

## 2013-10-14 DIAGNOSIS — E1059 Type 1 diabetes mellitus with other circulatory complications: Secondary | ICD-10-CM

## 2013-10-14 LAB — HEMOGLOBIN A1C: Mean Plasma Glucose: 148 mg/dL — ABNORMAL HIGH (ref <117)

## 2013-10-15 LAB — COMPREHENSIVE METABOLIC PANEL
AST: 17 U/L (ref 0–37)
Albumin: 4.1 g/dL (ref 3.5–5.2)
Alkaline Phosphatase: 64 U/L (ref 39–117)
BUN: 16 mg/dL (ref 6–23)
Chloride: 100 mEq/L (ref 96–112)
Glucose, Bld: 136 mg/dL — ABNORMAL HIGH (ref 70–99)
Potassium: 4 mEq/L (ref 3.5–5.3)
Sodium: 137 mEq/L (ref 135–145)
Total Bilirubin: 0.2 mg/dL — ABNORMAL LOW (ref 0.3–1.2)
Total Protein: 7.1 g/dL (ref 6.0–8.3)

## 2013-10-15 LAB — LIPID PANEL
Cholesterol: 180 mg/dL (ref 0–200)
HDL: 35 mg/dL — ABNORMAL LOW (ref >39)
LDL Cholesterol: 106 mg/dL — ABNORMAL HIGH (ref 0–99)
Total CHOL/HDL Ratio: 5.1 Ratio
VLDL: 39 mg/dL (ref 0–40)

## 2013-10-19 ENCOUNTER — Encounter: Payer: Self-pay | Admitting: *Deleted

## 2013-10-20 ENCOUNTER — Other Ambulatory Visit: Payer: Self-pay | Admitting: Internal Medicine

## 2013-10-20 ENCOUNTER — Encounter: Payer: Self-pay | Admitting: Internal Medicine

## 2013-10-20 ENCOUNTER — Ambulatory Visit (INDEPENDENT_AMBULATORY_CARE_PROVIDER_SITE_OTHER): Payer: BC Managed Care – PPO | Admitting: Internal Medicine

## 2013-10-20 VITALS — BP 128/70 | HR 87 | Temp 98.6°F | Wt 195.0 lb

## 2013-10-20 DIAGNOSIS — I10 Essential (primary) hypertension: Secondary | ICD-10-CM

## 2013-10-20 DIAGNOSIS — E119 Type 2 diabetes mellitus without complications: Secondary | ICD-10-CM

## 2013-10-20 DIAGNOSIS — E669 Obesity, unspecified: Secondary | ICD-10-CM

## 2013-10-20 DIAGNOSIS — R300 Dysuria: Secondary | ICD-10-CM

## 2013-10-20 LAB — POCT URINALYSIS DIPSTICK
Bilirubin, UA: NEGATIVE
Glucose, UA: NEGATIVE
Ketones, UA: NEGATIVE
Leukocytes, UA: NEGATIVE
Nitrite, UA: NEGATIVE

## 2013-10-20 MED ORDER — PHENTERMINE HCL 37.5 MG PO TABS: 37.5000 mg | ORAL_TABLET | Freq: Every day | ORAL | Status: DC

## 2013-10-20 NOTE — Assessment & Plan Note (Signed)
Lab Results  Component Value Date   HGBA1C 6.8* 10/14/2013   Significant improvement in BG control recently. Encouraged compliance with healthy diet and exercise. Encouraged compliance with metformin. Plan follow up 3 months and prn.

## 2013-10-20 NOTE — Assessment & Plan Note (Signed)
BP Readings from Last 3 Encounters:  10/20/13 128/70  09/04/13 146/80  08/17/13 140/82   BP well controlled on current medications. Will continue.

## 2013-10-20 NOTE — Progress Notes (Signed)
Subjective:    Patient ID: Jessica Houston, female    DOB: [birth year], 47 y.o.   MRN: [id number]  HPI 47 year old female with history of diabetes, hypertension, hyperlipidemia, obesity presents for followup. She reports that she is generally feeling well. She is compliant with medications. She is trying to follow a healthy diet. Her husband was recently diagnosed with diabetes and are working together to improve diet. She is not following an exercise program. Recent A1c showed improvement in blood sugars at 6.8%. She is interested in trying appetite suppressant to help her control appetite and lose weight.  In regards to BP, BP has been well controlled at home, typically 120s/60s. No headache, palpitations, chest pain.  Outpatient Encounter Prescriptions as of 10/20/2013  Medication Sig  . albuterol (PROVENTIL HFA;VENTOLIN HFA) 108 (90 BASE) MCG/ACT inhaler Inhale 2 puffs into the lungs every 6 (six) hours as needed for wheezing.  Marland Kitchen ALPRAZolam (XANAX) 0.25 MG tablet Take 1 tablet (0.25 mg total) by mouth 3 (three) times daily as needed for anxiety.  . Blood Glucose Monitoring Suppl (FREESTYLE FREEDOM LITE) W/DEVICE KIT Please dispense home testing glucometer for patient to use at home. Testing 1-2 times a day. Dx code 250.0  . BYSTOLIC 5 MG tablet Take one tablet by mouth one time daily  . Calcium Carbonate Antacid (TUMS PO) Take 1 tablet by mouth as needed.    . clotrimazole-betamethasone (LOTRISONE) cream Apply topically 2 (two) times daily.  . fluticasone (FLONASE) 50 MCG/ACT nasal spray Place 2 sprays into the nose as needed.  . fluticasone (FLOVENT HFA) 110 MCG/ACT inhaler Inhale 1 puff into the lungs 2 (two) times daily.  Marland Kitchen glucose blood (FREESTYLE LITE) test strip Patient is testing at home 1-2 times daily. Dx code 250.0  . glucose blood (ONE TOUCH TEST STRIPS) test strip 1 each by Other route 2 (two) times daily as needed. DISPENSE STRIPS FOR NANO METER DX: 250.00  . hydrochlorothiazide  (MICROZIDE) 12.5 MG capsule Take 1 capsule (12.5 mg total) by mouth daily.  . Lancets (ONETOUCH ULTRASOFT) lancets 1 each by Other route 2 (two) times daily. Dispense one touch nano lancets  . meloxicam (MOBIC) 15 MG tablet Take 1 tablet (15 mg total) by mouth daily.  . metFORMIN (GLUCOPHAGE) 500 MG tablet Take 2 tablets (1,000 mg total) by mouth 2 (two) times daily with a meal.  . Multiple Vitamin (MULTIVITAMIN) capsule Take 1 capsule by mouth daily.    . pantoprazole (PROTONIX) 40 MG tablet Take 1 tablet (40 mg total) by mouth daily.  . rosuvastatin (CRESTOR) 5 MG tablet Take 1 tablet (5 mg total) by mouth daily.  Marland Kitchen UNABLE TO FIND One Touch Ultra Mini Test strips for patient to use with home machine. Patient is testing blood sugars at home 2 times daily. Dx code 250.00  . Vitamin D, Ergocalciferol, (DRISDOL) 50000 UNITS CAPS   . Dermatological Products, Misc. (AURSTAT ANTI-ITCH HYDROGEL) KIT   . Omega-3 Fatty Acids (FISH OIL) 1000 MG CAPS Take 1 capsule by mouth daily.     BP 128/70  Pulse 87  Temp(Src) 98.6 F (37 C) (Oral)  Wt 195 lb (88.451 kg)  SpO2 97%  Review of Systems  Constitutional: Negative for fever, chills, appetite change, fatigue and unexpected weight change.  HENT: Negative for congestion, ear pain, sinus pressure, sore throat, trouble swallowing and voice change.   Eyes: Negative for visual disturbance.  Respiratory: Negative for cough, shortness of breath, wheezing and stridor.   Cardiovascular: Negative for chest  pain, palpitations and leg swelling.  Gastrointestinal: Negative for nausea, vomiting, abdominal pain, diarrhea, constipation, blood in stool, abdominal distention and anal bleeding.  Genitourinary: Positive for frequency (chronic). Negative for dysuria and flank pain.  Musculoskeletal: Negative for arthralgias, gait problem, myalgias and neck pain.  Skin: Negative for color change and rash.  Neurological: Negative for dizziness and headaches.    Hematological: Negative for adenopathy. Does not bruise/bleed easily.  Psychiatric/Behavioral: Negative for suicidal ideas, sleep disturbance and dysphoric mood. The patient is not nervous/anxious.        Objective:   Physical Exam  Constitutional: She is oriented to person, place, and time. She appears well-developed and well-nourished. No distress.  HENT:  Head: Normocephalic and atraumatic.  Right Ear: External ear normal.  Left Ear: External ear normal.  Nose: Nose normal.  Mouth/Throat: Oropharynx is clear and moist. No oropharyngeal exudate.  Eyes: Conjunctivae are normal. Pupils are equal, round, and reactive to light. Right eye exhibits no discharge. Left eye exhibits no discharge. No scleral icterus.  Neck: Normal range of motion. Neck supple. No tracheal deviation present. No thyromegaly present.  Cardiovascular: Normal rate, regular rhythm, normal heart sounds and intact distal pulses.  Exam reveals no gallop and no friction rub.   No murmur heard. Pulmonary/Chest: Effort normal and breath sounds normal. No accessory muscle usage. Not tachypneic. No respiratory distress. She has no decreased breath sounds. She has no wheezes. She has no rhonchi. She has no rales. She exhibits no tenderness.  Musculoskeletal: Normal range of motion. She exhibits no edema and no tenderness.  Lymphadenopathy:    She has no cervical adenopathy.  Neurological: She is alert and oriented to person, place, and time. No cranial nerve deficit. She exhibits normal muscle tone. Coordination normal.  Skin: Skin is warm and dry. No rash noted. She is not diaphoretic. No erythema. No pallor.  Psychiatric: She has a normal mood and affect. Her behavior is normal. Judgment and thought content normal.          Assessment & Plan:

## 2013-10-20 NOTE — Assessment & Plan Note (Signed)
Wt Readings from Last 3 Encounters:  10/20/13 195 lb (88.451 kg)  09/04/13 194 lb (87.998 kg)  08/17/13 195 lb (88.451 kg)   Encouraged healthy diet and exercise with goal of weight loss. Will start phentermine for appetite suppression. Discussed potential risks of this medication. Pt will call if any problems. Follow up 4 weeks to recheck weight and BP.

## 2013-10-20 NOTE — Progress Notes (Signed)
Pre-visit discussion using our clinic review tool. No additional management support is needed unless otherwise documented below in the visit note.  

## 2013-10-23 NOTE — Telephone Encounter (Signed)
General guidelines recommend screening with TSH to evaluate for thyroid dysfunction. However, if she would like to check free T4, then we can check with next labs.

## 2013-10-23 NOTE — Telephone Encounter (Signed)
Pt called and stated she wanted to know if we have drawn a fill TSH panel, and we did not. She stated she was doing some research and came across a web site that said if only a TSH was drawn it could miss a thyroid issue. Wondering if she can get a full panel. Please advise.

## 2013-10-23 NOTE — Telephone Encounter (Signed)
Left message to call back  

## 2013-10-26 NOTE — Telephone Encounter (Signed)
Left message to call back  

## 2013-10-26 NOTE — Telephone Encounter (Signed)
Patient left message on voicemail she was returning my call and I called patient back, no answer. Left detailed message on voicemail.

## 2013-10-28 NOTE — Telephone Encounter (Signed)
Patient never returned call, assume she understood detailed message left on her voicemail. Encounter closed.

## 2013-11-18 ENCOUNTER — Telehealth: Payer: Self-pay | Admitting: Internal Medicine

## 2013-11-18 NOTE — Telephone Encounter (Signed)
Bystolic 5 mg samples available?  Would like to pick up today if possible.

## 2013-11-18 NOTE — Telephone Encounter (Signed)
Patient informed some samples were left upfront for her to pick up

## 2013-11-24 ENCOUNTER — Ambulatory Visit: Payer: BC Managed Care – PPO | Admitting: Internal Medicine

## 2013-12-11 ENCOUNTER — Encounter: Payer: Self-pay | Admitting: Internal Medicine

## 2013-12-11 ENCOUNTER — Ambulatory Visit (INDEPENDENT_AMBULATORY_CARE_PROVIDER_SITE_OTHER): Payer: BC Managed Care – PPO | Admitting: Internal Medicine

## 2013-12-11 VITALS — BP 140/80 | HR 88 | Temp 98.5°F | Wt 188.0 lb

## 2013-12-11 DIAGNOSIS — E669 Obesity, unspecified: Secondary | ICD-10-CM

## 2013-12-11 DIAGNOSIS — E785 Hyperlipidemia, unspecified: Secondary | ICD-10-CM

## 2013-12-11 DIAGNOSIS — L659 Nonscarring hair loss, unspecified: Secondary | ICD-10-CM

## 2013-12-11 DIAGNOSIS — I10 Essential (primary) hypertension: Secondary | ICD-10-CM

## 2013-12-11 DIAGNOSIS — E119 Type 2 diabetes mellitus without complications: Secondary | ICD-10-CM

## 2013-12-11 MED ORDER — PHENTERMINE HCL 37.5 MG PO TABS: 37.5000 mg | ORAL_TABLET | Freq: Every day | ORAL | Status: DC

## 2013-12-11 NOTE — Progress Notes (Signed)
Pre-visit discussion using our clinic review tool. No additional management support is needed unless otherwise documented below in the visit note.  

## 2013-12-12 NOTE — Assessment & Plan Note (Signed)
Wt Readings from Last 3 Encounters:  12/11/13 188 lb (85.276 kg)  10/20/13 195 lb (88.451 kg)  09/04/13 194 lb (87.998 kg)   Congratulated pt on weight loss. Encouraged continued healthy diet and regular exercise. Continue phentermine for appetite suppression. Follow up 1 month.

## 2013-12-12 NOTE — Assessment & Plan Note (Signed)
Will check lipids with labs. Continue Crestor. 

## 2013-12-12 NOTE — Assessment & Plan Note (Signed)
BP Readings from Last 3 Encounters:  12/11/13 140/80  10/20/13 128/70  09/04/13 146/80   BP well controlled on HCTZ and Bystolic. Intolerant of ACEi. Will continue to monitor.

## 2013-12-12 NOTE — Assessment & Plan Note (Signed)
Lab Results  Component Value Date   HGBA1C 6.8* 10/14/2013   BG well controlled per pt report. Will check A1c with labs in 12/2013.

## 2013-12-12 NOTE — Assessment & Plan Note (Signed)
Pt reports thinning of hair. Previous TSH was normal. She requests check of T4, T3, and thyroid antibodies. Test ordered.

## 2013-12-12 NOTE — Progress Notes (Signed)
Subjective:    Patient ID: Jessica Houston, female    DOB: [birth year], 48 y.o.   MRN: [id number]  HPI 48 year old female with history of obesity, diabetes, hypertension presents for followup. At her last visit, we started phentermine to help with appetite suppression. She has lost 7 pounds in the interim. She is trying to follow a healthier diet and get regular physical activity. Blood sugars have been running mostly near 100-150. She is compliant with metformin. She is concerned about her persistent symptoms of thinning hair. She has been reading about thyroid dysfunction and would like complete thyroid panel including thyroid antibodies checked for thyroid disease. Previous TSH has been normal.  Outpatient Encounter Prescriptions as of 12/11/2013  Medication Sig  . albuterol (PROVENTIL HFA;VENTOLIN HFA) 108 (90 BASE) MCG/ACT inhaler Inhale 2 puffs into the lungs every 6 (six) hours as needed for wheezing.  Marland Kitchen ALPRAZolam (XANAX) 0.25 MG tablet Take 1 tablet (0.25 mg total) by mouth 3 (three) times daily as needed for anxiety.  . Blood Glucose Monitoring Suppl (FREESTYLE FREEDOM LITE) W/DEVICE KIT Please dispense home testing glucometer for patient to use at home. Testing 1-2 times a day. Dx code 073.7  . BYSTOLIC 5 MG tablet Take one tablet by mouth one time daily  . Calcium Carbonate Antacid (TUMS PO) Take 1 tablet by mouth as needed.    . clotrimazole-betamethasone (LOTRISONE) cream Apply topically 2 (two) times daily.  . Dermatological Products, Misc. (AURSTAT ANTI-ITCH HYDROGEL) KIT   . fluticasone (FLONASE) 50 MCG/ACT nasal spray Place 2 sprays into the nose as needed.  . fluticasone (FLOVENT HFA) 110 MCG/ACT inhaler Inhale 1 puff into the lungs 2 (two) times daily.  Marland Kitchen glucose blood (FREESTYLE LITE) test strip Patient is testing at home 1-2 times daily. Dx code 250.0  . glucose blood (ONE TOUCH TEST STRIPS) test strip 1 each by Other route 2 (two) times daily as needed. DISPENSE STRIPS FOR  NANO METER DX: 250.00  . hydrochlorothiazide (MICROZIDE) 12.5 MG capsule Take 1 capsule (12.5 mg total) by mouth daily.  . Lancets (ONETOUCH ULTRASOFT) lancets 1 each by Other route 2 (two) times daily. Dispense one touch nano lancets  . meloxicam (MOBIC) 15 MG tablet Take 1 tablet (15 mg total) by mouth daily.  . metFORMIN (GLUCOPHAGE) 500 MG tablet Take 2 tablets (1,000 mg total) by mouth 2 (two) times daily with a meal.  . Multiple Vitamin (MULTIVITAMIN) capsule Take 1 capsule by mouth daily.    . Omega-3 Fatty Acids (FISH OIL) 1000 MG CAPS Take 1 capsule by mouth daily.    . pantoprazole (PROTONIX) 40 MG tablet Take 1 tablet (40 mg total) by mouth daily.  . phentermine (ADIPEX-P) 37.5 MG tablet Take 1 tablet (37.5 mg total) by mouth daily before breakfast.  . rosuvastatin (CRESTOR) 5 MG tablet Take 1 tablet (5 mg total) by mouth daily.  Marland Kitchen UNABLE TO FIND One Touch Ultra Mini Test strips for patient to use with home machine. Patient is testing blood sugars at home 2 times daily. Dx code 250.00  . Vitamin D, Ergocalciferol, (DRISDOL) 50000 UNITS CAPS    BP 140/80  Pulse 88  Temp(Src) 98.5 F (36.9 C) (Oral)  Wt 188 lb (85.276 kg)  SpO2 98%  Review of Systems  Constitutional: Negative for fever, chills, appetite change, fatigue and unexpected weight change.  HENT: Negative for congestion, ear pain, sinus pressure, sore throat, trouble swallowing and voice change.   Eyes: Negative for visual disturbance.  Respiratory: Negative for  cough, shortness of breath, wheezing and stridor.   Cardiovascular: Negative for chest pain, palpitations and leg swelling.  Gastrointestinal: Negative for nausea, vomiting, abdominal pain, diarrhea, constipation, blood in stool, abdominal distention and anal bleeding.  Genitourinary: Negative for dysuria and flank pain.  Musculoskeletal: Negative for arthralgias, gait problem, myalgias and neck pain.  Skin: Negative for color change and rash.  Neurological:  Negative for dizziness and headaches.  Hematological: Negative for adenopathy. Does not bruise/bleed easily.  Psychiatric/Behavioral: Negative for suicidal ideas, sleep disturbance and dysphoric mood. The patient is not nervous/anxious.        Objective:   Physical Exam  Constitutional: She is oriented to person, place, and time. She appears well-developed and well-nourished. No distress.  HENT:  Head: Normocephalic and atraumatic.  Right Ear: External ear normal.  Left Ear: External ear normal.  Nose: Nose normal.  Mouth/Throat: Oropharynx is clear and moist. No oropharyngeal exudate.  Eyes: Conjunctivae are normal. Pupils are equal, round, and reactive to light. Right eye exhibits no discharge. Left eye exhibits no discharge. No scleral icterus.  Neck: Normal range of motion. Neck supple. No tracheal deviation present. No thyromegaly present.  Cardiovascular: Normal rate, regular rhythm, normal heart sounds and intact distal pulses.  Exam reveals no gallop and no friction rub.   No murmur heard. Pulmonary/Chest: Effort normal and breath sounds normal. No accessory muscle usage. Not tachypneic. No respiratory distress. She has no decreased breath sounds. She has no wheezes. She has no rhonchi. She has no rales. She exhibits no tenderness.  Musculoskeletal: Normal range of motion. She exhibits no edema and no tenderness.  Lymphadenopathy:    She has no cervical adenopathy.  Neurological: She is alert and oriented to person, place, and time. No cranial nerve deficit. She exhibits normal muscle tone. Coordination normal.  Skin: Skin is warm and dry. No rash noted. She is not diaphoretic. No erythema. No pallor.  Psychiatric: She has a normal mood and affect. Her behavior is normal. Judgment and thought content normal.          Assessment & Plan:

## 2013-12-16 ENCOUNTER — Telehealth: Payer: Self-pay

## 2013-12-16 NOTE — Telephone Encounter (Signed)
Relevant patient education assigned to patient using Emmi. ° °

## 2013-12-23 ENCOUNTER — Telehealth: Payer: Self-pay | Admitting: Internal Medicine

## 2013-12-23 NOTE — Telephone Encounter (Signed)
Relevant patient education assigned to patient using Emmi. ° °

## 2014-01-19 ENCOUNTER — Other Ambulatory Visit: Payer: BC Managed Care – PPO

## 2014-01-20 ENCOUNTER — Other Ambulatory Visit: Payer: Self-pay | Admitting: Internal Medicine

## 2014-01-22 ENCOUNTER — Ambulatory Visit: Payer: BC Managed Care – PPO | Admitting: Internal Medicine

## 2014-01-28 ENCOUNTER — Other Ambulatory Visit: Payer: Self-pay | Admitting: Internal Medicine

## 2014-02-02 ENCOUNTER — Other Ambulatory Visit: Payer: BC Managed Care – PPO

## 2014-02-05 ENCOUNTER — Ambulatory Visit: Payer: BC Managed Care – PPO | Admitting: Internal Medicine

## 2014-02-23 ENCOUNTER — Encounter (INDEPENDENT_AMBULATORY_CARE_PROVIDER_SITE_OTHER): Payer: Self-pay

## 2014-02-23 ENCOUNTER — Other Ambulatory Visit (INDEPENDENT_AMBULATORY_CARE_PROVIDER_SITE_OTHER): Payer: BC Managed Care – PPO

## 2014-02-23 DIAGNOSIS — E785 Hyperlipidemia, unspecified: Secondary | ICD-10-CM

## 2014-02-23 DIAGNOSIS — L659 Nonscarring hair loss, unspecified: Secondary | ICD-10-CM

## 2014-02-23 DIAGNOSIS — E119 Type 2 diabetes mellitus without complications: Secondary | ICD-10-CM

## 2014-02-23 LAB — LIPID PANEL
Cholesterol: 169 mg/dL (ref 0–200)
HDL: 34.2 mg/dL — AB (ref >39.00)
LDL Cholesterol: 105 mg/dL — ABNORMAL HIGH (ref 0–99)
Total CHOL/HDL Ratio: 5
Triglycerides: 147 mg/dL (ref 0.0–149.0)
VLDL: 29.4 mg/dL (ref 0.0–40.0)

## 2014-02-23 LAB — COMPREHENSIVE METABOLIC PANEL
ALT: 23 U/L (ref 0–35)
AST: 19 U/L (ref 0–37)
Albumin: 3.9 g/dL (ref 3.5–5.2)
Alkaline Phosphatase: 47 U/L (ref 39–117)
BILIRUBIN TOTAL: 0.4 mg/dL (ref 0.3–1.2)
BUN: 21 mg/dL (ref 6–23)
CO2: 27 mEq/L (ref 19–32)
CREATININE: 0.8 mg/dL (ref 0.4–1.2)
Calcium: 9.5 mg/dL (ref 8.4–10.5)
Chloride: 99 mEq/L (ref 96–112)
GFR: 81.34 mL/min (ref >60.00)
Glucose, Bld: 116 mg/dL — ABNORMAL HIGH (ref 70–99)
Potassium: 4.3 mEq/L (ref 3.5–5.1)
SODIUM: 135 meq/L (ref 135–145)
TOTAL PROTEIN: 7.1 g/dL (ref 6.0–8.3)

## 2014-02-23 LAB — T4, FREE: Free T4: 0.81 ng/dL (ref 0.60–1.60)

## 2014-02-23 LAB — T3, FREE: T3 FREE: 3.3 pg/mL (ref 2.3–4.2)

## 2014-02-23 LAB — HEMOGLOBIN A1C: Hgb A1c MFr Bld: 7.1 % — ABNORMAL HIGH (ref 4.6–6.5)

## 2014-02-23 LAB — TSH: TSH: 2.8 u[IU]/mL (ref 0.35–5.50)

## 2014-02-24 LAB — THYROID ANTIBODIES: Thyroglobulin Ab: <20 U/mL (ref <40.0)

## 2014-02-26 ENCOUNTER — Encounter: Payer: Self-pay | Admitting: Internal Medicine

## 2014-02-26 ENCOUNTER — Ambulatory Visit (INDEPENDENT_AMBULATORY_CARE_PROVIDER_SITE_OTHER): Payer: BC Managed Care – PPO | Admitting: Internal Medicine

## 2014-02-26 VITALS — BP 146/80 | HR 96 | Temp 98.5°F | Wt 183.0 lb

## 2014-02-26 DIAGNOSIS — E119 Type 2 diabetes mellitus without complications: Secondary | ICD-10-CM

## 2014-02-26 DIAGNOSIS — K649 Unspecified hemorrhoids: Secondary | ICD-10-CM

## 2014-02-26 DIAGNOSIS — E669 Obesity, unspecified: Secondary | ICD-10-CM

## 2014-02-26 DIAGNOSIS — I10 Essential (primary) hypertension: Secondary | ICD-10-CM

## 2014-02-26 DIAGNOSIS — E785 Hyperlipidemia, unspecified: Secondary | ICD-10-CM

## 2014-02-26 MED ORDER — PHENTERMINE HCL 37.5 MG PO TABS: 37.5000 mg | ORAL_TABLET | Freq: Every day | ORAL | Status: DC

## 2014-02-26 NOTE — Assessment & Plan Note (Signed)
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 Will continue current medications.

## 2014-02-26 NOTE — Assessment & Plan Note (Signed)
Reviewed lipids. Lipids are well controlled. Will continue Crestor.

## 2014-02-26 NOTE — Assessment & Plan Note (Signed)
Will set up surgical evaluation for skin tags from previous hemorrhoids.

## 2014-02-26 NOTE — Progress Notes (Signed)
Subjective:    Patient ID: Jessica Houston, female    DOB: 25-Jan-1966, 48 y.o.   MRN: [id number]  HPI 48YO female presents for follow up.  DM - BG running near 150. Compliant with meds. Recent A1c. 7.1%.  Obesity - Continues to work on weight loss. Following healthy diet and taking phentermine to help with appetite suppression. Does not take every day. No side effects noted.  HL - Taking Crestor 10mg  only 2x per week. Recent Total Cholesterol 169.  Hemorrhoids - Concerned about skin tags from hemorrhoids she developed during childbirth. Would like to talk to a surgeon to have areas removed.  Review of Systems  Constitutional: Negative for fever, chills, appetite change, fatigue and unexpected weight change.  HENT: Negative for congestion, ear pain, sinus pressure, sore throat, trouble swallowing and voice change.   Eyes: Negative for visual disturbance.  Respiratory: Negative for cough, shortness of breath, wheezing and stridor.   Cardiovascular: Negative for chest pain, palpitations and leg swelling.  Gastrointestinal: Negative for nausea, vomiting, abdominal pain, diarrhea, constipation, blood in stool, abdominal distention and anal bleeding.  Genitourinary: Negative for dysuria and flank pain.  Musculoskeletal: Negative for arthralgias, gait problem, myalgias and neck pain.  Skin: Negative for color change and rash.  Neurological: Negative for dizziness and headaches.  Hematological: Negative for adenopathy. Does not bruise/bleed easily.  Psychiatric/Behavioral: Negative for suicidal ideas, sleep disturbance and dysphoric mood. The patient is not nervous/anxious.        Objective:    BP 146/80  Pulse 96  Temp(Src) 98.5 F (36.9 C) (Oral)  Wt 183 lb (83.008 kg)  SpO2 97% Physical Exam  Constitutional: She is oriented to person, place, and time. She appears well-developed and well-nourished. No distress.  HENT:  Head: Normocephalic and atraumatic.  Right Ear: External ear  normal.  Left Ear: External ear normal.  Nose: Nose normal.  Mouth/Throat: Oropharynx is clear and moist. No oropharyngeal exudate.  Eyes: Conjunctivae are normal. Pupils are equal, round, and reactive to light. Right eye exhibits no discharge. Left eye exhibits no discharge. No scleral icterus.  Neck: Normal range of motion. Neck supple. No tracheal deviation present. No thyromegaly present.  Cardiovascular: Normal rate, regular rhythm, normal heart sounds and intact distal pulses.  Exam reveals no gallop and no friction rub.   No murmur heard. Pulmonary/Chest: Effort normal and breath sounds normal. No accessory muscle usage. Not tachypneic. No respiratory distress. She has no decreased breath sounds. She has no wheezes. She has no rhonchi. She has no rales. She exhibits no tenderness.  Musculoskeletal: Normal range of motion. She exhibits no edema and no tenderness.  Lymphadenopathy:    She has no cervical adenopathy.  Neurological: She is alert and oriented to person, place, and time. No cranial nerve deficit. She exhibits normal muscle tone. Coordination normal.  Skin: Skin is warm and dry. No rash noted. She is not diaphoretic. No erythema. No pallor.  Psychiatric: She has a normal mood and affect. Her behavior is normal. Judgment and thought content normal.          Assessment & Plan:   Problem List Items Addressed This Visit   Diabetes mellitus type 2, controlled - Primary     Reviewed recent BG and A1c. Will continue current medications. Follow up 3 months and prn.    Hemorrhoid     Will set up surgical evaluation for skin tags from previous hemorrhoids.    Relevant Orders      Ambulatory referral  to Colorectal Surgery   Hyperlipidemia     Reviewed lipids. Lipids are well controlled. Will continue Crestor.    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 Will continue current medications.    Obesity (BMI 30-39.9)      Wt Readings from Last 3 Encounters:  02/26/14 183 lb (83.008 kg)  12/11/13 188 lb (85.276 kg)  10/20/13 195 lb (88.451 kg)   Congratulated pt on weight loss. Encouraged continued healthy diet and regular exercise. Continue phentermine for appetite suppression. Follow up 3 month.      Relevant Medications      phentermine (ADIPEX-P) 37.5 MG tablet       Return in about 3 months (around 05/28/2014) for Recheck of Diabetes.

## 2014-02-26 NOTE — Assessment & Plan Note (Signed)
Reviewed recent BG and A1c. Will continue current medications. Follow up 3 months and prn.

## 2014-02-26 NOTE — Assessment & Plan Note (Signed)
Wt Readings from Last 3 Encounters:  02/26/14 183 lb (83.008 kg)  12/11/13 188 lb (85.276 kg)  10/20/13 195 lb (88.451 kg)   Congratulated pt on weight loss. Encouraged continued healthy diet and regular exercise. Continue phentermine for appetite suppression. Follow up 3 month.

## 2014-02-26 NOTE — Progress Notes (Signed)
Pre visit review using our clinic review tool, if applicable. No additional management support is needed unless otherwise documented below in the visit note. 

## 2014-03-01 ENCOUNTER — Encounter: Payer: Self-pay | Admitting: *Deleted

## 2014-03-31 ENCOUNTER — Other Ambulatory Visit: Payer: Self-pay | Admitting: Internal Medicine

## 2014-04-01 NOTE — Telephone Encounter (Signed)
Last visit 02/26/14, ok refill? 

## 2014-04-22 ENCOUNTER — Other Ambulatory Visit: Payer: Self-pay | Admitting: Internal Medicine

## 2014-04-23 NOTE — Telephone Encounter (Signed)
Ok refill? 

## 2014-05-04 ENCOUNTER — Other Ambulatory Visit: Payer: Self-pay | Admitting: Internal Medicine

## 2014-05-10 DIAGNOSIS — I10 Essential (primary) hypertension: Secondary | ICD-10-CM | POA: Insufficient documentation

## 2014-05-10 DIAGNOSIS — G4730 Sleep apnea, unspecified: Secondary | ICD-10-CM

## 2014-05-10 DIAGNOSIS — G4710 Hypersomnia, unspecified: Secondary | ICD-10-CM | POA: Insufficient documentation

## 2014-06-04 ENCOUNTER — Ambulatory Visit: Payer: BC Managed Care – PPO | Admitting: Internal Medicine

## 2014-06-11 ENCOUNTER — Encounter: Payer: Self-pay | Admitting: Internal Medicine

## 2014-06-11 ENCOUNTER — Encounter: Payer: Self-pay | Admitting: *Deleted

## 2014-06-11 ENCOUNTER — Ambulatory Visit (INDEPENDENT_AMBULATORY_CARE_PROVIDER_SITE_OTHER): Payer: BC Managed Care – PPO | Admitting: Internal Medicine

## 2014-06-11 VITALS — BP 132/80 | HR 91 | Temp 98.5°F | Ht 61.5 in | Wt 183.2 lb

## 2014-06-11 DIAGNOSIS — E119 Type 2 diabetes mellitus without complications: Secondary | ICD-10-CM

## 2014-06-11 DIAGNOSIS — E669 Obesity, unspecified: Secondary | ICD-10-CM

## 2014-06-11 DIAGNOSIS — E785 Hyperlipidemia, unspecified: Secondary | ICD-10-CM

## 2014-06-11 DIAGNOSIS — G4700 Insomnia, unspecified: Secondary | ICD-10-CM | POA: Insufficient documentation

## 2014-06-11 DIAGNOSIS — I10 Essential (primary) hypertension: Secondary | ICD-10-CM

## 2014-06-11 LAB — COMPREHENSIVE METABOLIC PANEL
ALBUMIN: 4 g/dL (ref 3.5–5.2)
ALT: 25 U/L (ref 0–35)
AST: 23 U/L (ref 0–37)
Alkaline Phosphatase: 56 U/L (ref 39–117)
BUN: 15 mg/dL (ref 6–23)
CALCIUM: 9.6 mg/dL (ref 8.4–10.5)
CHLORIDE: 98 meq/L (ref 96–112)
CO2: 27 mEq/L (ref 19–32)
Creatinine, Ser: 0.9 mg/dL (ref 0.4–1.2)
GFR: 74.74 mL/min (ref >60.00)
GLUCOSE: 124 mg/dL — AB (ref 70–99)
Potassium: 3.9 mEq/L (ref 3.5–5.1)
SODIUM: 133 meq/L — AB (ref 135–145)
TOTAL PROTEIN: 7.3 g/dL (ref 6.0–8.3)
Total Bilirubin: 0.2 mg/dL (ref 0.2–1.2)

## 2014-06-11 LAB — LIPID PANEL
CHOL/HDL RATIO: 5
Cholesterol: 189 mg/dL (ref 0–200)
HDL: 37.9 mg/dL — ABNORMAL LOW (ref >39.00)
NonHDL: 151.1
Triglycerides: 222 mg/dL — ABNORMAL HIGH (ref 0.0–149.0)
VLDL: 44.4 mg/dL — ABNORMAL HIGH (ref 0.0–40.0)

## 2014-06-11 LAB — MICROALBUMIN / CREATININE URINE RATIO
CREATININE, U: 49.3 mg/dL
Microalb Creat Ratio: 1.4 mg/g (ref 0.0–30.0)
Microalb, Ur: 0.7 mg/dL (ref 0.0–1.9)

## 2014-06-11 LAB — HM DIABETES FOOT EXAM: HM Diabetic Foot Exam: NORMAL

## 2014-06-11 NOTE — Progress Notes (Signed)
Subjective:    Patient ID: Jessica Houston, female    DOB: 21-Feb-1966, 48 y.o.   MRN: [id number]  HPI 48YO female presents to follow up DM.  DM - Did not bring record of BG today. Compliant with meds.  Obesity - only taking 1/2 tab of Phentermine. Planning to stop medication.  Increased stress at work recently. Having some trouble sleeping. Taking occasional Alprazolam 0.25-0.75mg  to help with sleep with some improvement.  Review of Systems  Constitutional: Negative for fever, chills, appetite change, fatigue and unexpected weight change.  Eyes: Negative for visual disturbance.  Respiratory: Negative for shortness of breath.   Cardiovascular: Negative for chest pain and leg swelling.  Gastrointestinal: Negative for abdominal pain.  Skin: Negative for color change and rash.  Hematological: Negative for adenopathy. Does not bruise/bleed easily.  Psychiatric/Behavioral: Positive for sleep disturbance. Negative for dysphoric mood. The patient is nervous/anxious.        Objective:    BP 132/80  Pulse 91  Temp(Src) 98.5 F (36.9 C) (Oral)  Ht 5' 1.5" (1.562 m)  Wt 183 lb 4 oz (83.122 kg)  BMI 34.07 kg/m2  SpO2 98%  LMP 05/21/2014 Physical Exam  Constitutional: She is oriented to person, place, and time. She appears well-developed and well-nourished. No distress.  HENT:  Head: Normocephalic and atraumatic.  Right Ear: External ear normal.  Left Ear: External ear normal.  Nose: Nose normal.  Mouth/Throat: Oropharynx is clear and moist. No oropharyngeal exudate.  Eyes: Conjunctivae are normal. Pupils are equal, round, and reactive to light. Right eye exhibits no discharge. Left eye exhibits no discharge. No scleral icterus.  Neck: Normal range of motion. Neck supple. No tracheal deviation present. No thyromegaly present.  Cardiovascular: Normal rate, regular rhythm, normal heart sounds and intact distal pulses.  Exam reveals no gallop and no friction rub.   No murmur  heard. Pulmonary/Chest: Effort normal and breath sounds normal. No accessory muscle usage. Not tachypneic. No respiratory distress. She has no decreased breath sounds. She has no wheezes. She has no rhonchi. She has no rales. She exhibits no tenderness.  Musculoskeletal: Normal range of motion. She exhibits no edema and no tenderness.  Lymphadenopathy:    She has no cervical adenopathy.  Neurological: She is alert and oriented to person, place, and time. No cranial nerve deficit. She exhibits normal muscle tone. Coordination normal.  Skin: Skin is warm and dry. No rash noted. She is not diaphoretic. No erythema. No pallor.  Psychiatric: She has a normal mood and affect. Her behavior is normal. Judgment and thought content normal.          Assessment & Plan:   Problem List Items Addressed This Visit     Unprioritized   Diabetes mellitus type 2, controlled - Primary      Lab Results  Component Value Date   HGBA1C 7.1* 02/23/2014   Will check A1c with labs today. Foot exam normal today. Will request recent eye exam. Follow up in 3 months.    Relevant Orders      Lipid panel      Microalbumin / creatinine urine ratio      Comprehensive metabolic panel      Hemoglobin A1c   Hyperlipidemia     Will check lipids and A1c with labs today. Continue Crestor.    Hypertension      BP Readings from Last 3 Encounters:  06/11/14 132/80  02/26/14 146/80  12/11/13 140/80   BP well controlled today. Will  check renal function with labs.    Obesity (BMI 30-39.9)      Wt Readings from Last 3 Encounters:  06/11/14 183 lb 4 oz (83.122 kg)  02/26/14 183 lb (83.008 kg)  12/11/13 188 lb (85.276 kg)   Body mass index is 34.07 kg/(m^2). Encouraged healthy diet and exercise. Will stop phentermine.        Return in about 3 months (around 09/11/2014) for Recheck of Diabetes.

## 2014-06-11 NOTE — Assessment & Plan Note (Signed)
Lab Results  Component Value Date   HGBA1C 7.1* 02/23/2014   Will check A1c with labs today. Foot exam normal today. Will request recent eye exam. Follow up in 3 months.

## 2014-06-11 NOTE — Assessment & Plan Note (Signed)
Wt Readings from Last 3 Encounters:  06/11/14 183 lb 4 oz (83.122 kg)  02/26/14 183 lb (83.008 kg)  12/11/13 188 lb (85.276 kg)   Body mass index is 34.07 kg/(m^2). Encouraged healthy diet and exercise. Will stop phentermine.

## 2014-06-11 NOTE — Progress Notes (Signed)
Pre visit review using our clinic review tool, if applicable. No additional management support is needed unless otherwise documented below in the visit note. 

## 2014-06-11 NOTE — Assessment & Plan Note (Signed)
BP Readings from Last 3 Encounters:  06/11/14 132/80  02/26/14 146/80  12/11/13 140/80   BP well controlled today. Will check renal function with labs.

## 2014-06-11 NOTE — Assessment & Plan Note (Signed)
Discussed OTC meds for insomnia. Discussed good sleep hygiene. Will follow up in 3 months and prn.

## 2014-06-11 NOTE — Patient Instructions (Signed)
Labs today.   Follow up in 3 months.  

## 2014-06-11 NOTE — Progress Notes (Signed)
Chart reviewed for DM bundle. Has appt today with Dr. Gilford Rile recheck BP at appt

## 2014-06-11 NOTE — Assessment & Plan Note (Signed)
Will check lipids and A1c with labs today. Continue Crestor.

## 2014-06-14 ENCOUNTER — Encounter: Payer: Self-pay | Admitting: *Deleted

## 2014-06-14 LAB — LDL CHOLESTEROL, DIRECT: Direct LDL: 128 mg/dL

## 2014-06-16 ENCOUNTER — Other Ambulatory Visit (INDEPENDENT_AMBULATORY_CARE_PROVIDER_SITE_OTHER): Payer: BC Managed Care – PPO

## 2014-06-16 DIAGNOSIS — E119 Type 2 diabetes mellitus without complications: Secondary | ICD-10-CM

## 2014-06-17 LAB — COMPREHENSIVE METABOLIC PANEL
ALT: 21 U/L (ref 0–35)
AST: 26 U/L (ref 0–37)
Albumin: 3.9 g/dL (ref 3.5–5.2)
Alkaline Phosphatase: 57 U/L (ref 39–117)
BILIRUBIN TOTAL: 0.1 mg/dL — AB (ref 0.2–1.2)
BUN: 16 mg/dL (ref 6–23)
CHLORIDE: 102 meq/L (ref 96–112)
CO2: 24 meq/L (ref 19–32)
Calcium: 9.3 mg/dL (ref 8.4–10.5)
Creatinine, Ser: 1.1 mg/dL (ref 0.4–1.2)
GFR: 53.98 mL/min — AB (ref >60.00)
GLUCOSE: 123 mg/dL — AB (ref 70–99)
Potassium: 4.1 mEq/L (ref 3.5–5.1)
Sodium: 135 mEq/L (ref 135–145)
Total Protein: 7.5 g/dL (ref 6.0–8.3)

## 2014-06-17 LAB — HEMOGLOBIN A1C: HEMOGLOBIN A1C: 7.3 % — AB (ref 4.6–6.5)

## 2014-07-02 ENCOUNTER — Other Ambulatory Visit: Payer: Self-pay | Admitting: Internal Medicine

## 2014-07-19 ENCOUNTER — Other Ambulatory Visit: Payer: Self-pay | Admitting: Internal Medicine

## 2014-09-17 ENCOUNTER — Encounter: Payer: Self-pay | Admitting: Internal Medicine

## 2014-09-17 ENCOUNTER — Ambulatory Visit (INDEPENDENT_AMBULATORY_CARE_PROVIDER_SITE_OTHER): Payer: BC Managed Care – PPO | Admitting: Internal Medicine

## 2014-09-17 VITALS — BP 124/66 | HR 81 | Temp 98.2°F | Ht 61.5 in | Wt 189.8 lb

## 2014-09-17 DIAGNOSIS — E785 Hyperlipidemia, unspecified: Secondary | ICD-10-CM

## 2014-09-17 DIAGNOSIS — E119 Type 2 diabetes mellitus without complications: Secondary | ICD-10-CM

## 2014-09-17 DIAGNOSIS — Z23 Encounter for immunization: Secondary | ICD-10-CM

## 2014-09-17 DIAGNOSIS — Z1239 Encounter for other screening for malignant neoplasm of breast: Secondary | ICD-10-CM

## 2014-09-17 DIAGNOSIS — I10 Essential (primary) hypertension: Secondary | ICD-10-CM

## 2014-09-17 DIAGNOSIS — E669 Obesity, unspecified: Secondary | ICD-10-CM

## 2014-09-17 DIAGNOSIS — E559 Vitamin D deficiency, unspecified: Secondary | ICD-10-CM

## 2014-09-17 LAB — LIPID PANEL
CHOLESTEROL: 174 mg/dL (ref 0–200)
HDL: 34.2 mg/dL — ABNORMAL LOW (ref >39.00)
LDL Cholesterol: 103 mg/dL — ABNORMAL HIGH (ref 0–99)
NONHDL: 139.8
Total CHOL/HDL Ratio: 5
Triglycerides: 182 mg/dL — ABNORMAL HIGH (ref 0.0–149.0)
VLDL: 36.4 mg/dL (ref 0.0–40.0)

## 2014-09-17 LAB — COMPREHENSIVE METABOLIC PANEL
ALBUMIN: 3.3 g/dL — AB (ref 3.5–5.2)
ALK PHOS: 58 U/L (ref 39–117)
ALT: 21 U/L (ref 0–35)
AST: 25 U/L (ref 0–37)
BUN: 14 mg/dL (ref 6–23)
CO2: 25 meq/L (ref 19–32)
Calcium: 9.3 mg/dL (ref 8.4–10.5)
Chloride: 100 mEq/L (ref 96–112)
Creatinine, Ser: 0.9 mg/dL (ref 0.4–1.2)
GFR: 73.66 mL/min (ref >60.00)
GLUCOSE: 128 mg/dL — AB (ref 70–99)
POTASSIUM: 3.9 meq/L (ref 3.5–5.1)
Sodium: 135 mEq/L (ref 135–145)
Total Bilirubin: 0.4 mg/dL (ref 0.2–1.2)
Total Protein: 7 g/dL (ref 6.0–8.3)

## 2014-09-17 LAB — MICROALBUMIN / CREATININE URINE RATIO
Creatinine,U: 47.7 mg/dL
MICROALB UR: 1.6 mg/dL (ref 0.0–1.9)
Microalb Creat Ratio: 3.4 mg/g (ref 0.0–30.0)

## 2014-09-17 LAB — HEMOGLOBIN A1C: HEMOGLOBIN A1C: 7 % — AB (ref 4.6–6.5)

## 2014-09-17 LAB — VITAMIN D 25 HYDROXY (VIT D DEFICIENCY, FRACTURES): VITD: 56.3 ng/mL (ref 30.00–100.00)

## 2014-09-17 NOTE — Assessment & Plan Note (Signed)
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 Will check level with labs today.

## 2014-09-17 NOTE — Patient Instructions (Signed)
Labs today.  Follow up in 3 months and sooner as needed. 

## 2014-09-17 NOTE — Progress Notes (Signed)
Pre visit review using our clinic review tool, if applicable. No additional management support is needed unless otherwise documented below in the visit note. 

## 2014-09-17 NOTE — Assessment & Plan Note (Signed)
BP Readings from Last 3 Encounters:  09/17/14 124/66  06/11/14 132/80  02/26/14 146/80   BP well controlled on current medication. Renal function with labs today.

## 2014-09-17 NOTE — Progress Notes (Signed)
Subjective:    Patient ID: Jessica Houston, female    DOB: 1966-10-21, 48 y.o.   MRN: [id number]  HPI 48YO female presents for follow up.  DM - BG have been well controlled, near 130-140s.  HL - Taking Crestor 2x per week.  Feels fatigued. Working from [employer] until 6pm. Has not had energy for exercise recently. Frustrated by weight gain.  Review of Systems  Constitutional: Positive for fatigue. Negative for fever, chills, appetite change and unexpected weight change.  Eyes: Negative for visual disturbance.  Respiratory: Negative for shortness of breath.   Cardiovascular: Negative for chest pain and leg swelling.  Gastrointestinal: Negative for nausea, vomiting, abdominal pain, diarrhea and constipation.  Musculoskeletal: Negative for arthralgias and myalgias.  Skin: Negative for color change and rash.  Hematological: Negative for adenopathy. Does not bruise/bleed easily.  Psychiatric/Behavioral: Negative for sleep disturbance and dysphoric mood. The patient is not nervous/anxious.        Objective:    BP 124/66  Pulse 81  Temp(Src) 98.2 F (36.8 C) (Oral)  Ht 5' 1.5" (1.562 m)  Wt 189 lb 12 oz (86.07 kg)  BMI 35.28 kg/m2  SpO2 97%  LMP 08/12/2014 Physical Exam  Constitutional: She is oriented to person, place, and time. She appears well-developed and well-nourished. No distress.  HENT:  Head: Normocephalic and atraumatic.  Right Ear: External ear normal.  Left Ear: External ear normal.  Nose: Nose normal.  Mouth/Throat: Oropharynx is clear and moist. No oropharyngeal exudate.  Eyes: Conjunctivae are normal. Pupils are equal, round, and reactive to light. Right eye exhibits no discharge. Left eye exhibits no discharge. No scleral icterus.  Neck: Normal range of motion. Neck supple. No tracheal deviation present. No thyromegaly present.  Cardiovascular: Normal rate, regular rhythm, normal heart sounds and intact distal pulses.  Exam reveals no gallop and no friction rub.    No murmur heard. Pulmonary/Chest: Effort normal and breath sounds normal. No accessory muscle usage. Not tachypneic. No respiratory distress. She has no decreased breath sounds. She has no wheezes. She has no rhonchi. She has no rales. She exhibits no tenderness.  Musculoskeletal: Normal range of motion. She exhibits no edema and no tenderness.  Lymphadenopathy:    She has no cervical adenopathy.  Neurological: She is alert and oriented to person, place, and time. No cranial nerve deficit. She exhibits normal muscle tone. Coordination normal.  Skin: Skin is warm and dry. No rash noted. She is not diaphoretic. No erythema. No pallor.  Psychiatric: She has a normal mood and affect. Her behavior is normal. Judgment and thought content normal.          Assessment & Plan:   Problem List Items Addressed This Visit     Unprioritized   Diabetes mellitus type 2, controlled - Primary      Lab Results  Component Value Date   HGBA1C 7.3* 06/11/2014   Will repeat A1c with labs today. Continue current medications.    Relevant Orders      Comprehensive metabolic panel      Hemoglobin A1c      Lipid panel      Microalbumin / creatinine urine ratio   Hyperlipidemia     Will check lipids with labs today. Continue Crestor    Hypertension      BP Readings from Last 3 Encounters:  09/17/14 124/66  06/11/14 132/80  02/26/14 146/80   BP well controlled on current medication. Renal function with labs today.    Obesity (  BMI 30-39.9)      Wt Readings from Last 3 Encounters:  09/17/14 189 lb 12 oz (86.07 kg)  06/11/14 183 lb 4 oz (83.122 kg)  02/26/14 183 lb (83.008 kg)   Encouraged healthy diet and exercise with goal of weight loss.    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 Will check level with labs today.    Relevant Orders      Vitamin D (25 hydroxy)    Other Visit Diagnoses   Screening for breast cancer         Relevant Orders       MM Digital Screening        Return in about 3 months (around 12/18/2014) for Physical.

## 2014-09-17 NOTE — Assessment & Plan Note (Signed)
Wt Readings from Last 3 Encounters:  09/17/14 189 lb 12 oz (86.07 kg)  06/11/14 183 lb 4 oz (83.122 kg)  02/26/14 183 lb (83.008 kg)   Encouraged healthy diet and exercise with goal of weight loss.

## 2014-09-17 NOTE — Assessment & Plan Note (Signed)
Will check lipids with labs today. Continue Crestor. 

## 2014-09-17 NOTE — Assessment & Plan Note (Signed)
Lab Results  Component Value Date   HGBA1C 7.3* 06/11/2014   Will repeat A1c with labs today. Continue current medications.

## 2014-09-28 ENCOUNTER — Other Ambulatory Visit: Payer: Self-pay | Admitting: Internal Medicine

## 2014-10-22 ENCOUNTER — Ambulatory Visit: Payer: Self-pay | Admitting: Internal Medicine

## 2014-10-22 LAB — HM MAMMOGRAPHY: HM MAMMO: NEGATIVE

## 2014-10-23 LAB — HM DIABETES EYE EXAM

## 2014-11-02 ENCOUNTER — Encounter: Payer: Self-pay | Admitting: Internal Medicine

## 2014-12-01 ENCOUNTER — Other Ambulatory Visit: Payer: Self-pay | Admitting: Internal Medicine

## 2014-12-01 NOTE — Telephone Encounter (Signed)
Last OV 10.30.15, last refill 11.10.15.  Please advise refill

## 2014-12-24 ENCOUNTER — Ambulatory Visit (INDEPENDENT_AMBULATORY_CARE_PROVIDER_SITE_OTHER): Payer: BLUE CROSS/BLUE SHIELD | Admitting: Internal Medicine

## 2014-12-24 ENCOUNTER — Encounter: Payer: Self-pay | Admitting: Internal Medicine

## 2014-12-24 VITALS — BP 134/79 | HR 92 | Temp 98.4°F | Ht 61.5 in | Wt 191.8 lb

## 2014-12-24 DIAGNOSIS — E119 Type 2 diabetes mellitus without complications: Secondary | ICD-10-CM

## 2014-12-24 DIAGNOSIS — E669 Obesity, unspecified: Secondary | ICD-10-CM

## 2014-12-24 DIAGNOSIS — Z0000 Encounter for general adult medical examination without abnormal findings: Secondary | ICD-10-CM | POA: Insufficient documentation

## 2014-12-24 LAB — COMPREHENSIVE METABOLIC PANEL
ALT: 18 U/L (ref 0–35)
AST: 17 U/L (ref 0–37)
Albumin: 4.1 g/dL (ref 3.5–5.2)
Alkaline Phosphatase: 53 U/L (ref 39–117)
BUN: 21 mg/dL (ref 6–23)
CHLORIDE: 100 meq/L (ref 96–112)
CO2: 27 mEq/L (ref 19–32)
CREATININE: 0.79 mg/dL (ref 0.40–1.20)
Calcium: 9.6 mg/dL (ref 8.4–10.5)
GFR: 82.24 mL/min (ref >60.00)
GLUCOSE: 113 mg/dL — AB (ref 70–99)
Potassium: 4 mEq/L (ref 3.5–5.1)
Sodium: 135 mEq/L (ref 135–145)
Total Bilirubin: 0.3 mg/dL (ref 0.2–1.2)
Total Protein: 7.6 g/dL (ref 6.0–8.3)

## 2014-12-24 LAB — CBC WITH DIFFERENTIAL/PLATELET
BASOS PCT: 0.5 % (ref 0.0–3.0)
Basophils Absolute: 0.1 10*3/uL (ref 0.0–0.1)
Eosinophils Absolute: 0.1 10*3/uL (ref 0.0–0.7)
Eosinophils Relative: 0.9 % (ref 0.0–5.0)
HEMATOCRIT: 29.6 % — AB (ref 36.0–46.0)
Hemoglobin: 9.2 g/dL — ABNORMAL LOW (ref 12.0–15.0)
LYMPHS ABS: 2.8 10*3/uL (ref 0.7–4.0)
Lymphocytes Relative: 24 % (ref 12.0–46.0)
MCHC: 31.1 g/dL (ref 30.0–36.0)
MONO ABS: 0.9 10*3/uL (ref 0.1–1.0)
Monocytes Relative: 8.1 % (ref 3.0–12.0)
NEUTROS ABS: 7.6 10*3/uL (ref 1.4–7.7)
NEUTROS PCT: 66.5 % (ref 43.0–77.0)
PLATELETS: 508 10*3/uL — AB (ref 150.0–400.0)
RBC: 4.91 Mil/uL (ref 3.87–5.11)
RDW: 20.1 % — AB (ref 11.5–15.5)
WBC: 11.5 10*3/uL — AB (ref 4.0–10.5)

## 2014-12-24 LAB — LIPID PANEL
Cholesterol: 188 mg/dL (ref 0–200)
HDL: 35.7 mg/dL — ABNORMAL LOW (ref >39.00)
NonHDL: 152.3
TRIGLYCERIDES: 254 mg/dL — AB (ref 0.0–149.0)
Total CHOL/HDL Ratio: 5
VLDL: 50.8 mg/dL — ABNORMAL HIGH (ref 0.0–40.0)

## 2014-12-24 LAB — POCT URINALYSIS DIPSTICK
Bilirubin, UA: NEGATIVE
Glucose, UA: NEGATIVE
Ketones, UA: NEGATIVE
NITRITE UA: NEGATIVE
Protein, UA: NEGATIVE
Spec Grav, UA: <=1.005
Urobilinogen, UA: 0.2
pH, UA: 5.5

## 2014-12-24 LAB — MICROALBUMIN / CREATININE URINE RATIO
Creatinine,U: 31.2 mg/dL
Microalb Creat Ratio: 4.2 mg/g (ref 0.0–30.0)
Microalb, Ur: 1.3 mg/dL (ref 0.0–1.9)

## 2014-12-24 LAB — LDL CHOLESTEROL, DIRECT: Direct LDL: 129 mg/dL

## 2014-12-24 LAB — TSH: TSH: 2.82 u[IU]/mL (ref 0.35–4.50)

## 2014-12-24 LAB — VITAMIN D 25 HYDROXY (VIT D DEFICIENCY, FRACTURES): VITD: 42.03 ng/mL (ref 30.00–100.00)

## 2014-12-24 LAB — HEMOGLOBIN A1C: Hgb A1c MFr Bld: 7.1 % — ABNORMAL HIGH (ref 4.6–6.5)

## 2014-12-24 NOTE — Assessment & Plan Note (Signed)
Lab Results  Component Value Date   HGBA1C 7.0* 09/17/2014   BG well controlled. Will repeat A1c with labs today. Eye exam UTD. Foot exam UTD.

## 2014-12-24 NOTE — Assessment & Plan Note (Signed)
General medical exam normal today. Breast and pelvic exam deferred as completed by her OB. Mammogram UTD. Will check labs including CBC, CMP, lipids, TSH, A1c, Vit D. Encouraged healthy diet and exercise. Follow up in 3 months and prn.

## 2014-12-24 NOTE — Progress Notes (Signed)
Pre visit review using our clinic review tool, if applicable. No additional management support is needed unless otherwise documented below in the visit note. 

## 2014-12-24 NOTE — Assessment & Plan Note (Signed)
Wt Readings from Last 3 Encounters:  12/24/14 191 lb 12 oz (86.977 kg)  09/17/14 189 lb 12 oz (86.07 kg)  06/11/14 183 lb 4 oz (83.122 kg)   Body mass index is 35.65 kg/(m^2). Encouraged healthy diet and exercise with goal of weight loss.

## 2014-12-24 NOTE — Patient Instructions (Signed)

## 2014-12-24 NOTE — Progress Notes (Signed)
Subjective:    Patient ID: Jessica Houston, female    DOB: [birth year], 49 y.o.   MRN: [id number]  HPI  49YO female presents for annual exam.  DM - BG have been well controlled. None over 200. Mostly near 160.  Not following any particular diet or exercise plan.  Compliant with medications.   Wt Readings from Last 3 Encounters:  12/24/14 191 lb 12 oz (86.977 kg)  09/17/14 189 lb 12 oz (86.07 kg)  06/11/14 183 lb 4 oz (83.122 kg)      Past medical, surgical, family and social history per today's encounter.  Review of Systems  Constitutional: Negative for fever, chills, appetite change, fatigue and unexpected weight change.  Eyes: Negative for visual disturbance.  Respiratory: Negative for shortness of breath.   Cardiovascular: Negative for chest pain and leg swelling.  Gastrointestinal: Negative for nausea, vomiting, abdominal pain, diarrhea and constipation.  Musculoskeletal: Negative for myalgias and arthralgias.  Skin: Negative for color change and rash.  Hematological: Negative for adenopathy. Does not bruise/bleed easily.  Psychiatric/Behavioral: Negative for sleep disturbance and dysphoric mood. The patient is not nervous/anxious.        Objective:    BP 134/79 mmHg  Pulse 92  Temp(Src) 98.4 F (36.9 C) (Oral)  Ht 5' 1.5" (1.562 m)  Wt 191 lb 12 oz (86.977 kg)  BMI 35.65 kg/m2  SpO2 100%  LMP 12/05/2014 Physical Exam  Constitutional: She is oriented to person, place, and time. She appears well-developed and well-nourished. No distress.  HENT:  Head: Normocephalic and atraumatic.  Right Ear: External ear normal.  Left Ear: External ear normal.  Nose: Nose normal.  Mouth/Throat: Oropharynx is clear and moist. No oropharyngeal exudate.  Eyes: Conjunctivae and EOM are normal. Pupils are equal, round, and reactive to light. Right eye exhibits no discharge.  Neck: Normal range of motion. Neck supple. No thyromegaly present.  Cardiovascular: Normal rate,  regular rhythm, normal heart sounds and intact distal pulses.  Exam reveals no gallop and no friction rub.   No murmur heard. Pulmonary/Chest: Effort normal. No respiratory distress. She has no wheezes. She has no rales.  Abdominal: Soft. Bowel sounds are normal. She exhibits no distension and no mass. There is no tenderness. There is no rebound and no guarding.  Musculoskeletal: Normal range of motion. She exhibits no edema or tenderness.  Lymphadenopathy:    She has no cervical adenopathy.  Neurological: She is alert and oriented to person, place, and time. No cranial nerve deficit. Coordination normal.  Skin: Skin is warm and dry. No rash noted. She is not diaphoretic. No erythema. No pallor.  Psychiatric: She has a normal mood and affect. Her behavior is normal. Judgment and thought content normal.          Assessment & Plan:   Problem List Items Addressed This Visit      Unprioritized   Diabetes mellitus type 2, controlled    Lab Results  Component Value Date   HGBA1C 7.0* 09/17/2014   BG well controlled. Will repeat A1c with labs today. Eye exam UTD. Foot exam UTD.      Relevant Orders   Comprehensive metabolic panel   Hemoglobin A1c   Lipid panel   Microalbumin / creatinine urine ratio   Obesity (BMI 30-39.9)    Wt Readings from Last 3 Encounters:  12/24/14 191 lb 12 oz (86.977 kg)  09/17/14 189 lb 12 oz (86.07 kg)  06/11/14 183 lb 4 oz (83.122 kg)   Body  mass index is 35.65 kg/(m^2). Encouraged healthy diet and exercise with goal of weight loss.      Routine general medical examination at a health care facility - Primary    General medical exam normal today. Breast and pelvic exam deferred as completed by her OB. Mammogram UTD. Will check labs including CBC, CMP, lipids, TSH, A1c, Vit D. Encouraged healthy diet and exercise. Follow up in 3 months and prn.      Relevant Orders   Vit D  25 hydroxy (rtn osteoporosis monitoring)   CBC w/Diff   TSH   POCT  Urinalysis Dipstick       Return in about 3 months (around 03/24/2015) for Recheck of Diabetes.

## 2014-12-24 NOTE — Addendum Note (Signed)
Addended by: Karlene Einstein D on: 12/24/2014 10:43 AM   Modules accepted: Orders

## 2014-12-27 ENCOUNTER — Encounter: Payer: Self-pay | Admitting: *Deleted

## 2014-12-27 ENCOUNTER — Other Ambulatory Visit: Payer: Self-pay | Admitting: *Deleted

## 2014-12-27 LAB — CULTURE, URINE COMPREHENSIVE: Colony Count: 45000

## 2014-12-27 MED ORDER — SULFAMETHOXAZOLE-TRIMETHOPRIM 800-160 MG PO TABS: 1.0000 | ORAL_TABLET | Freq: Two times a day (BID) | ORAL | Status: DC

## 2014-12-27 MED ORDER — FERROUS SULFATE 325 (65 FE) MG PO TABS: 325.0000 mg | ORAL_TABLET | Freq: Three times a day (TID) | ORAL | Status: DC

## 2015-01-01 ENCOUNTER — Other Ambulatory Visit: Payer: Self-pay | Admitting: Internal Medicine

## 2015-01-01 LAB — HM PAP SMEAR: HM Pap smear: NORMAL

## 2015-01-10 ENCOUNTER — Other Ambulatory Visit: Payer: BLUE CROSS/BLUE SHIELD

## 2015-01-11 ENCOUNTER — Other Ambulatory Visit: Payer: Self-pay | Admitting: *Deleted

## 2015-01-11 ENCOUNTER — Other Ambulatory Visit (INDEPENDENT_AMBULATORY_CARE_PROVIDER_SITE_OTHER): Payer: BLUE CROSS/BLUE SHIELD

## 2015-01-11 DIAGNOSIS — Z1211 Encounter for screening for malignant neoplasm of colon: Secondary | ICD-10-CM

## 2015-01-11 LAB — FECAL OCCULT BLOOD, IMMUNOCHEMICAL: FECAL OCCULT BLD: NEGATIVE

## 2015-01-24 ENCOUNTER — Other Ambulatory Visit: Payer: BC Managed Care – PPO

## 2015-01-27 ENCOUNTER — Telehealth: Payer: Self-pay | Admitting: *Deleted

## 2015-01-27 ENCOUNTER — Other Ambulatory Visit (INDEPENDENT_AMBULATORY_CARE_PROVIDER_SITE_OTHER): Payer: BC Managed Care – PPO

## 2015-01-27 DIAGNOSIS — D62 Acute posthemorrhagic anemia: Secondary | ICD-10-CM

## 2015-01-27 LAB — CBC WITH DIFFERENTIAL/PLATELET
Basophils Absolute: 0.1 10*3/uL (ref 0.0–0.1)
Basophils Relative: 0.7 % (ref 0.0–3.0)
EOS PCT: 1.1 % (ref 0.0–5.0)
Eosinophils Absolute: 0.1 10*3/uL (ref 0.0–0.7)
HCT: 38.5 % (ref 36.0–46.0)
HEMOGLOBIN: 12.4 g/dL (ref 12.0–15.0)
Lymphocytes Relative: 28.2 % (ref 12.0–46.0)
Lymphs Abs: 2.2 10*3/uL (ref 0.7–4.0)
MCHC: 32.2 g/dL (ref 30.0–36.0)
MCV: 71.1 fl — ABNORMAL LOW (ref 78.0–100.0)
MONO ABS: 0.7 10*3/uL (ref 0.1–1.0)
Monocytes Relative: 9.2 % (ref 3.0–12.0)
Neutro Abs: 4.8 10*3/uL (ref 1.4–7.7)
Neutrophils Relative %: 60.8 % (ref 43.0–77.0)
Platelets: 414 10*3/uL — ABNORMAL HIGH (ref 150.0–400.0)
RBC: 5.41 Mil/uL — ABNORMAL HIGH (ref 3.87–5.11)
RDW: 33.5 % — ABNORMAL HIGH (ref 11.5–15.5)
WBC: 7.9 10*3/uL (ref 4.0–10.5)

## 2015-01-27 LAB — FERRITIN: FERRITIN: 8.2 ng/mL — AB (ref 10.0–291.0)

## 2015-01-27 NOTE — Telephone Encounter (Signed)
Labs and dx?  

## 2015-01-27 NOTE — Telephone Encounter (Signed)
CBC and ferritin for anemia

## 2015-01-27 NOTE — Telephone Encounter (Signed)
What labs and dx?  

## 2015-02-02 ENCOUNTER — Telehealth: Payer: Self-pay | Admitting: *Deleted

## 2015-02-02 ENCOUNTER — Other Ambulatory Visit: Payer: Self-pay | Admitting: Internal Medicine

## 2015-02-02 ENCOUNTER — Encounter: Payer: Self-pay | Admitting: *Deleted

## 2015-02-02 DIAGNOSIS — D509 Iron deficiency anemia, unspecified: Secondary | ICD-10-CM

## 2015-02-02 NOTE — Telephone Encounter (Signed)
Her labs showed that anemia was improved and stool testing for blood was negative. However, we can make a referral for colonoscopy.

## 2015-02-02 NOTE — Telephone Encounter (Signed)
Pt called about her labs, Pt states that she has been eating iron rich foods and taking iron supplement TID for the past month, she is very concerned about her iron still being low.  Requesting for colonoscopy to be scheduled.

## 2015-02-05 ENCOUNTER — Encounter: Payer: Self-pay | Admitting: Internal Medicine

## 2015-02-18 ENCOUNTER — Telehealth: Payer: Self-pay | Admitting: Internal Medicine

## 2015-02-18 NOTE — Telephone Encounter (Signed)
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  SHE SAID SOMEONE FROM [facility] CALLED HER ABOUT MAKING AN APPOINTMENT.  SHE ALREADY HAS HER APPT  AND THANKS FOR CALLING HER BACK

## 2015-02-18 NOTE — Telephone Encounter (Signed)
Left msg to call office for referral appt date and time.msn

## 2015-03-07 ENCOUNTER — Ambulatory Visit: Admit: 2015-03-07 | Disposition: A | Discharge status: Home / Self Care | Payer: Self-pay | Admitting: Gastroenterology

## 2015-03-07 LAB — HM COLONOSCOPY: HM COLON: NORMAL

## 2015-03-15 ENCOUNTER — Encounter: Payer: Self-pay | Admitting: *Deleted

## 2015-03-31 ENCOUNTER — Encounter: Payer: Self-pay | Admitting: Internal Medicine

## 2015-04-01 ENCOUNTER — Encounter: Payer: Self-pay | Admitting: Internal Medicine

## 2015-04-01 ENCOUNTER — Ambulatory Visit (INDEPENDENT_AMBULATORY_CARE_PROVIDER_SITE_OTHER): Payer: BLUE CROSS/BLUE SHIELD | Admitting: Internal Medicine

## 2015-04-01 VITALS — BP 125/77 | HR 81 | Temp 98.3°F | Ht 61.5 in | Wt 192.0 lb

## 2015-04-01 DIAGNOSIS — E119 Type 2 diabetes mellitus without complications: Secondary | ICD-10-CM | POA: Diagnosis not present

## 2015-04-01 DIAGNOSIS — E669 Obesity, unspecified: Secondary | ICD-10-CM

## 2015-04-01 DIAGNOSIS — D649 Anemia, unspecified: Secondary | ICD-10-CM

## 2015-04-01 DIAGNOSIS — E559 Vitamin D deficiency, unspecified: Secondary | ICD-10-CM

## 2015-04-01 DIAGNOSIS — E785 Hyperlipidemia, unspecified: Secondary | ICD-10-CM | POA: Diagnosis not present

## 2015-04-01 DIAGNOSIS — I10 Essential (primary) hypertension: Secondary | ICD-10-CM | POA: Diagnosis not present

## 2015-04-01 LAB — CBC WITH DIFFERENTIAL/PLATELET
BASOS ABS: 0 10*3/uL (ref 0.0–0.1)
Basophils Relative: 0.5 % (ref 0.0–3.0)
Eosinophils Absolute: 0.1 10*3/uL (ref 0.0–0.7)
Eosinophils Relative: 1 % (ref 0.0–5.0)
HCT: 40.8 % (ref 36.0–46.0)
Hemoglobin: 13.7 g/dL (ref 12.0–15.0)
Lymphocytes Relative: 25.4 % (ref 12.0–46.0)
Lymphs Abs: 2 10*3/uL (ref 0.7–4.0)
MCHC: 33.6 g/dL (ref 30.0–36.0)
MCV: 81.6 fl (ref 78.0–100.0)
Monocytes Absolute: 0.6 10*3/uL (ref 0.1–1.0)
Monocytes Relative: 8.2 % (ref 3.0–12.0)
NEUTROS ABS: 5.1 10*3/uL (ref 1.4–7.7)
Neutrophils Relative %: 64.9 % (ref 43.0–77.0)
PLATELETS: 379 10*3/uL (ref 150.0–400.0)
RBC: 5 Mil/uL (ref 3.87–5.11)
RDW: 14.8 % (ref 11.5–15.5)
WBC: 7.9 10*3/uL (ref 4.0–10.5)

## 2015-04-01 LAB — COMPREHENSIVE METABOLIC PANEL
ALT: 24 U/L (ref 0–35)
AST: 18 U/L (ref 0–37)
Albumin: 3.9 g/dL (ref 3.5–5.2)
Alkaline Phosphatase: 57 U/L (ref 39–117)
BILIRUBIN TOTAL: 0.2 mg/dL (ref 0.2–1.2)
BUN: 15 mg/dL (ref 6–23)
CHLORIDE: 99 meq/L (ref 96–112)
CO2: 28 mEq/L (ref 19–32)
CREATININE: 0.73 mg/dL (ref 0.40–1.20)
Calcium: 9.4 mg/dL (ref 8.4–10.5)
GFR: 89.99 mL/min (ref >60.00)
GLUCOSE: 128 mg/dL — AB (ref 70–99)
Potassium: 3.7 mEq/L (ref 3.5–5.1)
Sodium: 134 mEq/L — ABNORMAL LOW (ref 135–145)
TOTAL PROTEIN: 6.9 g/dL (ref 6.0–8.3)

## 2015-04-01 LAB — HEMOGLOBIN A1C: HEMOGLOBIN A1C: 6.8 % — AB (ref 4.6–6.5)

## 2015-04-01 LAB — LDL CHOLESTEROL, DIRECT: Direct LDL: 144 mg/dL

## 2015-04-01 LAB — FERRITIN: FERRITIN: 9.9 ng/mL — AB (ref 10.0–291.0)

## 2015-04-01 LAB — LIPID PANEL
Cholesterol: 210 mg/dL — ABNORMAL HIGH (ref 0–200)
HDL: 36.3 mg/dL — ABNORMAL LOW (ref >39.00)
NONHDL: 173.7
Total CHOL/HDL Ratio: 6
Triglycerides: 293 mg/dL — ABNORMAL HIGH (ref 0.0–149.0)
VLDL: 58.6 mg/dL — ABNORMAL HIGH (ref 0.0–40.0)

## 2015-04-01 LAB — VITAMIN D 25 HYDROXY (VIT D DEFICIENCY, FRACTURES): VITD: 32.88 ng/mL (ref 30.00–100.00)

## 2015-04-01 NOTE — Assessment & Plan Note (Signed)
BP Readings from Last 3 Encounters:  04/01/15 125/77  12/24/14 134/79  09/17/14 124/66   BP well controlled. Will continue current medications. Renal function with labs.

## 2015-04-01 NOTE — Assessment & Plan Note (Signed)
Will recheck Vit D with labs today. 

## 2015-04-01 NOTE — Progress Notes (Signed)
Pre visit review using our clinic review tool, if applicable. No additional management support is needed unless otherwise documented below in the visit note. 

## 2015-04-01 NOTE — Assessment & Plan Note (Signed)
Will check A1c with labs today. Continue current medications. 

## 2015-04-01 NOTE — Assessment & Plan Note (Signed)
Wt Readings from Last 3 Encounters:  04/01/15 192 lb (87.091 kg)  12/24/14 191 lb 12 oz (86.977 kg)  09/17/14 189 lb 12 oz (86.07 kg)   Encouraged healthy diet and exercise. Discouraged use of Essential Oils for weight loss.

## 2015-04-01 NOTE — Assessment & Plan Note (Signed)
Will check lipids and LFTs with labs. Continue Crestor.

## 2015-04-01 NOTE — Patient Instructions (Addendum)
Labs today.  Follow up in 3 months and sooner as needed.  Consider reading the book "Always Hungry" by Isabella Stalling.

## 2015-04-01 NOTE — Assessment & Plan Note (Signed)
Recent IDA. Will recheck CBC and ferritin with labs today. Reviewed recent colonoscopy and endoscopy.

## 2015-04-01 NOTE — Progress Notes (Signed)
Subjective:    Patient ID: Jessica Houston, female    DOB: 1966-10-12, 49 y.o.   MRN: [id number]  HPI  49YO female presents for follow up.  DM - Hasn't checked BG. Compliant with medication.  Iron Deficiency Anemia - Taking iron supplement twice daily. Had some nausea with medication. Colonoscopy and EGD were normal. Menses have not generally been heavy.   HTN - BP has been well controlled at home, but sometimes more elevated with visits at physician offices. No CP, palpitations, headache.  Wt Readings from Last 3 Encounters:  04/01/15 192 lb (87.091 kg)  12/24/14 191 lb 12 oz (86.977 kg)  09/17/14 189 lb 12 oz (86.07 kg)     Past medical, surgical, family and social history per today's encounter.  Review of Systems  Constitutional: Negative for fever, chills, appetite change, fatigue and unexpected weight change.  Eyes: Negative for visual disturbance.  Respiratory: Negative for shortness of breath.   Cardiovascular: Negative for chest pain and leg swelling.  Gastrointestinal: Negative for nausea, vomiting, abdominal pain, diarrhea and constipation.  Musculoskeletal: Negative for myalgias and arthralgias.  Skin: Negative for color change and rash.  Hematological: Negative for adenopathy. Does not bruise/bleed easily.  Psychiatric/Behavioral: Negative for sleep disturbance and dysphoric mood. The patient is not nervous/anxious.        Objective:    BP 125/77 mmHg  Pulse 81  Temp(Src) 98.3 F (36.8 C) (Oral)  Ht 5' 1.5" (1.562 m)  Wt 192 lb (87.091 kg)  BMI 35.70 kg/m2  SpO2 97%  LMP 03/22/2015 Physical Exam  Constitutional: She is oriented to person, place, and time. She appears well-developed and well-nourished. No distress.  HENT:  Head: Normocephalic and atraumatic.  Right Ear: External ear normal.  Left Ear: External ear normal.  Nose: Nose normal.  Mouth/Throat: Oropharynx is clear and moist. No oropharyngeal exudate.  Eyes: Conjunctivae are normal.  Pupils are equal, round, and reactive to light. Right eye exhibits no discharge. Left eye exhibits no discharge. No scleral icterus.  Neck: Normal range of motion. Neck supple. No tracheal deviation present. No thyromegaly present.  Cardiovascular: Normal rate, regular rhythm, normal heart sounds and intact distal pulses.  Exam reveals no gallop and no friction rub.   No murmur heard. Pulmonary/Chest: Effort normal and breath sounds normal. No respiratory distress. She has no wheezes. She has no rales. She exhibits no tenderness.  Musculoskeletal: Normal range of motion. She exhibits no edema or tenderness.  Lymphadenopathy:    She has no cervical adenopathy.  Neurological: She is alert and oriented to person, place, and time. No cranial nerve deficit. She exhibits normal muscle tone. Coordination normal.  Skin: Skin is warm and dry. No rash noted. She is not diaphoretic. No erythema. No pallor.  Psychiatric: She has a normal mood and affect. Her behavior is normal. Judgment and thought content normal.          Assessment & Plan:   Problem List Items Addressed This Visit      Unprioritized   Absolute anemia    Recent IDA. Will recheck CBC and ferritin with labs today. Reviewed recent colonoscopy and endoscopy.      Relevant Orders   CBC with Differential/Platelet   Ferritin   Diabetes mellitus type 2, controlled - Primary    Will check A1c with labs today. Continue current medications.      Relevant Orders   Comprehensive metabolic panel   Lipid panel   Hemoglobin A1c   Hyperlipidemia  Will check lipids and LFTs with labs. Continue Crestor.      Hypertension    BP Readings from Last 3 Encounters:  04/01/15 125/77  12/24/14 134/79  09/17/14 124/66   BP well controlled. Will continue current medications. Renal function with labs.      Obesity (BMI 30-39.9)    Wt Readings from Last 3 Encounters:  04/01/15 192 lb (87.091 kg)  12/24/14 191 lb 12 oz (86.977 kg)    09/17/14 189 lb 12 oz (86.07 kg)   Encouraged healthy diet and exercise. Discouraged use of Essential Oils for weight loss.      Vitamin D deficiency    Will recheck Vit D with labs today.      Relevant Orders   Vit D  25 hydroxy (rtn osteoporosis monitoring)       Return in about 3 months (around 07/02/2015) for Recheck of Diabetes.

## 2015-04-05 ENCOUNTER — Encounter: Payer: Self-pay | Admitting: Internal Medicine

## 2015-04-06 ENCOUNTER — Other Ambulatory Visit: Payer: Self-pay | Admitting: *Deleted

## 2015-04-06 DIAGNOSIS — J45909 Unspecified asthma, uncomplicated: Secondary | ICD-10-CM

## 2015-04-06 MED ORDER — PANTOPRAZOLE SODIUM 40 MG PO TBEC: 40.0000 mg | DELAYED_RELEASE_TABLET | Freq: Every day | ORAL | Status: DC

## 2015-04-06 MED ORDER — FLUTICASONE PROPIONATE HFA 110 MCG/ACT IN AERO: 1.0000 | INHALATION_SPRAY | Freq: Two times a day (BID) | RESPIRATORY_TRACT | Status: DC

## 2015-04-06 MED ORDER — NEBIVOLOL HCL 5 MG PO TABS: 5.0000 mg | ORAL_TABLET | Freq: Every day | ORAL | Status: DC

## 2015-04-06 MED ORDER — HYDROCHLOROTHIAZIDE 12.5 MG PO CAPS: 12.5000 mg | ORAL_CAPSULE | Freq: Every day | ORAL | Status: DC

## 2015-04-06 MED ORDER — FLUTICASONE PROPIONATE 50 MCG/ACT NA SUSP: NASAL | Status: DC

## 2015-04-06 MED ORDER — FERROUS SULFATE 325 (65 FE) MG PO TABS: ORAL_TABLET | ORAL | Status: DC

## 2015-04-06 MED ORDER — METFORMIN HCL 500 MG PO TABS: ORAL_TABLET | ORAL | Status: DC

## 2015-04-07 ENCOUNTER — Other Ambulatory Visit: Payer: Self-pay | Admitting: *Deleted

## 2015-04-07 MED ORDER — GLUCOSE BLOOD VI STRP: ORAL_STRIP | Status: DC

## 2015-04-14 ENCOUNTER — Telehealth: Payer: Self-pay | Admitting: Internal Medicine

## 2015-04-14 NOTE — Telephone Encounter (Signed)
Left message on VM to return call for OV.

## 2015-04-14 NOTE — Telephone Encounter (Signed)
PT called and wants see if her PCP will send something for her UTI; which she believes started yesterday. Last seen on 5.13.16  04/14/15 maf

## 2015-06-13 ENCOUNTER — Encounter
Admission: RE | Admit: 2015-06-13 | Discharge: 2015-06-13 | Disposition: A | Discharge status: Home / Self Care | Payer: BLUE CROSS/BLUE SHIELD | Source: Ambulatory Visit | Attending: Surgery | Admitting: Surgery

## 2015-06-13 DIAGNOSIS — Z01812 Encounter for preprocedural laboratory examination: Secondary | ICD-10-CM | POA: Insufficient documentation

## 2015-06-13 DIAGNOSIS — Z01810 Encounter for preprocedural cardiovascular examination: Secondary | ICD-10-CM | POA: Insufficient documentation

## 2015-06-13 HISTORY — DX: Family history of other specified conditions: Z84.89

## 2015-06-13 HISTORY — DX: Other specified postprocedural states: Z98.890

## 2015-06-13 HISTORY — DX: Other specified postprocedural states: R11.2

## 2015-06-13 LAB — CBC
HCT: 40.4 % (ref 35.0–47.0)
Hemoglobin: 14 g/dL (ref 12.0–16.0)
MCH: 29.9 pg (ref 26.0–34.0)
MCHC: 34.7 g/dL (ref 32.0–36.0)
MCV: 86.1 fL (ref 80.0–100.0)
PLATELETS: 331 10*3/uL (ref 150–440)
RBC: 4.69 MIL/uL (ref 3.80–5.20)
RDW: 15.9 % — ABNORMAL HIGH (ref 11.5–14.5)
WBC: 7.7 10*3/uL (ref 3.6–11.0)

## 2015-06-13 LAB — BASIC METABOLIC PANEL
ANION GAP: 10 (ref 5–15)
BUN: 12 mg/dL (ref 6–20)
CALCIUM: 9 mg/dL (ref 8.9–10.3)
CO2: 27 mmol/L (ref 22–32)
CREATININE: 0.74 mg/dL (ref 0.44–1.00)
Chloride: 100 mmol/L — ABNORMAL LOW (ref 101–111)
GFR calc Af Amer: >60 mL/min (ref >60)
GFR calc non Af Amer: >60 mL/min (ref >60)
Glucose, Bld: 113 mg/dL — ABNORMAL HIGH (ref 65–99)
POTASSIUM: 3.7 mmol/L (ref 3.5–5.1)
Sodium: 137 mmol/L (ref 135–145)

## 2015-06-13 NOTE — Patient Instructions (Signed)
  Your procedure is scheduled on: Tuesday June 21, 2015 Report to Same Day Surgery. To find out your arrival time please call [phone] between 1PM - 3PM on Monday June 20, 2015.  Remember: Instructions that are not followed completely may result in serious medical risk, up to and including death, or upon the discretion of your surgeon and anesthesiologist your surgery may need to be rescheduled.    _x___ 1. Do not eat food or drink liquids after midnight. No gum chewing or hard candies.     _x___ 2. No Alcohol for 24 hours before or after surgery.   ____ 3. Bring all medications with you on the day of surgery if instructed.    _x___ 4. Notify your doctor if there is any change in your medical condition     (cold, fever, infections).     Do not wear jewelry, make-up, hairpins, clips or nail polish.  Do not wear lotions, powders, or perfumes. You may wear deodorant.  Do not shave 48 hours prior to surgery. Men may shave face and neck.  Do not bring valuables to the hospital.    [facility] is not responsible for any belongings or valuables.               Contacts, dentures or bridgework may not be worn into surgery.  Leave your suitcase in the car. After surgery it may be brought to your room.  For patients admitted to the hospital, discharge time is determined by your treatment team.   Patients discharged the day of surgery will not be allowed to drive home.    Please read over the following fact sheets that you were given:   [facility] Preparing for Surgery  _x___ Take these medicines the morning of surgery with A SIP OF WATER:    1. nebivolol (BYSTOLIC)   2. pantoprazole (PROTONIX)    ____ Fleet Enema (as directed)   ____ Use CHG Soap as directed  __x__ Use inhalers on the day of surgery  __x__ Stop metformin 2 days prior to surgery on Sunday June 19, 2015.  ____ Take 1/2 of usual insulin dose the night before surgery and none on the morning of surgery.   ____  Stop Coumadin/Plavix/aspirin on dose not apply.  _x___ Stop Anti-inflammatories on today.   ____ Stop supplements until after surgery.    _x___ Bring C-Pap to the hospital.

## 2015-06-17 ENCOUNTER — Telehealth: Payer: Self-pay | Admitting: *Deleted

## 2015-06-17 NOTE — Telephone Encounter (Signed)
Recieved fax from [place], with questions to see if pt qualifies to have a home sleep study completed.  Form states that if the pt has chronic asthma she will not qualify to have the home study done.  Pt does have Chronic asthma.    Please schedule pt an in lab sleep study.

## 2015-06-21 ENCOUNTER — Ambulatory Visit
Admission: RE | Admit: 2015-06-21 | Discharge: 2015-06-21 | Disposition: A | Discharge status: Home / Self Care | Payer: BLUE CROSS/BLUE SHIELD | Source: Ambulatory Visit | Attending: Surgery | Admitting: Surgery

## 2015-06-21 ENCOUNTER — Ambulatory Visit: Payer: BLUE CROSS/BLUE SHIELD | Admitting: Anesthesiology

## 2015-06-21 ENCOUNTER — Encounter
Admission: RE | Disposition: A | Discharge status: Home / Self Care | Payer: Self-pay | Source: Ambulatory Visit | Attending: Surgery

## 2015-06-21 ENCOUNTER — Encounter: Payer: Self-pay | Admitting: *Deleted

## 2015-06-21 DIAGNOSIS — E785 Hyperlipidemia, unspecified: Secondary | ICD-10-CM | POA: Insufficient documentation

## 2015-06-21 DIAGNOSIS — Z79899 Other long term (current) drug therapy: Secondary | ICD-10-CM | POA: Insufficient documentation

## 2015-06-21 DIAGNOSIS — Z833 Family history of diabetes mellitus: Secondary | ICD-10-CM | POA: Insufficient documentation

## 2015-06-21 DIAGNOSIS — Z9889 Other specified postprocedural states: Secondary | ICD-10-CM | POA: Diagnosis not present

## 2015-06-21 DIAGNOSIS — K644 Residual hemorrhoidal skin tags: Secondary | ICD-10-CM | POA: Insufficient documentation

## 2015-06-21 DIAGNOSIS — Z7951 Long term (current) use of inhaled steroids: Secondary | ICD-10-CM | POA: Diagnosis not present

## 2015-06-21 DIAGNOSIS — Z9851 Tubal ligation status: Secondary | ICD-10-CM | POA: Diagnosis not present

## 2015-06-21 DIAGNOSIS — E669 Obesity, unspecified: Secondary | ICD-10-CM | POA: Diagnosis not present

## 2015-06-21 DIAGNOSIS — K648 Other hemorrhoids: Secondary | ICD-10-CM | POA: Insufficient documentation

## 2015-06-21 DIAGNOSIS — K602 Anal fissure, unspecified: Secondary | ICD-10-CM | POA: Diagnosis present

## 2015-06-21 DIAGNOSIS — Z888 Allergy status to other drugs, medicaments and biological substances status: Secondary | ICD-10-CM | POA: Insufficient documentation

## 2015-06-21 DIAGNOSIS — J309 Allergic rhinitis, unspecified: Secondary | ICD-10-CM | POA: Insufficient documentation

## 2015-06-21 DIAGNOSIS — I10 Essential (primary) hypertension: Secondary | ICD-10-CM | POA: Diagnosis not present

## 2015-06-21 DIAGNOSIS — Z8249 Family history of ischemic heart disease and other diseases of the circulatory system: Secondary | ICD-10-CM | POA: Insufficient documentation

## 2015-06-21 DIAGNOSIS — Z885 Allergy status to narcotic agent status: Secondary | ICD-10-CM | POA: Insufficient documentation

## 2015-06-21 DIAGNOSIS — Z85828 Personal history of other malignant neoplasm of skin: Secondary | ICD-10-CM | POA: Insufficient documentation

## 2015-06-21 DIAGNOSIS — Z881 Allergy status to other antibiotic agents status: Secondary | ICD-10-CM | POA: Diagnosis not present

## 2015-06-21 DIAGNOSIS — Z8489 Family history of other specified conditions: Secondary | ICD-10-CM | POA: Insufficient documentation

## 2015-06-21 DIAGNOSIS — E119 Type 2 diabetes mellitus without complications: Secondary | ICD-10-CM | POA: Insufficient documentation

## 2015-06-21 HISTORY — PX: HEMORRHOID SURGERY: SHX153

## 2015-06-21 LAB — POCT PREGNANCY, URINE: PREG TEST UR: NEGATIVE

## 2015-06-21 LAB — GLUCOSE, CAPILLARY
Glucose-Capillary: 157 mg/dL — ABNORMAL HIGH (ref 65–99)
Glucose-Capillary: 145 mg/dL — ABNORMAL HIGH (ref 65–99)

## 2015-06-21 SURGERY — HEMORRHOIDECTOMY
Anesthesia: General | Wound class: Clean Contaminated

## 2015-06-21 MED ORDER — OXYCODONE-ACETAMINOPHEN 5-325 MG PO TABS
1.0000 | ORAL_TABLET | ORAL | Status: DC | PRN
  Administered 2015-06-21: 1 via ORAL

## 2015-06-21 MED ORDER — PROPOFOL 10 MG/ML IV BOLUS
INTRAVENOUS | Status: DC | PRN
  Administered 2015-06-21: 40 mg via INTRAVENOUS
  Administered 2015-06-21: 20 mg via INTRAVENOUS
  Administered 2015-06-21: 100 mg via INTRAVENOUS

## 2015-06-21 MED ORDER — FENTANYL CITRATE (PF) 100 MCG/2ML IJ SOLN
INTRAMUSCULAR | Status: DC | PRN
  Administered 2015-06-21 (×4): 25 ug via INTRAVENOUS
  Administered 2015-06-21: 50 ug via INTRAVENOUS

## 2015-06-21 MED ORDER — OXYCODONE-ACETAMINOPHEN 5-325 MG PO TABS
ORAL_TABLET | ORAL | Status: AC
  Filled 2015-06-21: qty 1

## 2015-06-21 MED ORDER — OXYCODONE-ACETAMINOPHEN 5-325 MG PO TABS: 1.0000 | ORAL_TABLET | ORAL | Status: DC | PRN

## 2015-06-21 MED ORDER — LIDOCAINE HCL (CARDIAC) 20 MG/ML IV SOLN
INTRAVENOUS | Status: DC | PRN
  Administered 2015-06-21: 30 mg via INTRAVENOUS

## 2015-06-21 MED ORDER — BUPIVACAINE LIPOSOME 1.3 % IJ SUSP
INTRAMUSCULAR | Status: DC | PRN
  Administered 2015-06-21: 20 mL

## 2015-06-21 MED ORDER — ONDANSETRON HCL 4 MG/2ML IJ SOLN
INTRAMUSCULAR | Status: DC | PRN
  Administered 2015-06-21: 4 mg via INTRAVENOUS

## 2015-06-21 MED ORDER — FENTANYL CITRATE (PF) 100 MCG/2ML IJ SOLN
25.0000 ug | INTRAMUSCULAR | Status: DC | PRN
  Administered 2015-06-21 (×4): 25 ug via INTRAVENOUS

## 2015-06-21 MED ORDER — SODIUM CHLORIDE 0.9 % IV SOLN
INTRAVENOUS | Status: DC
  Administered 2015-06-21: 08:00:00 via INTRAVENOUS

## 2015-06-21 MED ORDER — BUPIVACAINE LIPOSOME 1.3 % IJ SUSP
INTRAMUSCULAR | Status: AC
  Filled 2015-06-21: qty 20

## 2015-06-21 MED ORDER — BUPIVACAINE-EPINEPHRINE (PF) 0.5% -1:200000 IJ SOLN
INTRAMUSCULAR | Status: AC
  Filled 2015-06-21: qty 30

## 2015-06-21 MED ORDER — EPHEDRINE SULFATE 50 MG/ML IJ SOLN
INTRAMUSCULAR | Status: DC | PRN
  Administered 2015-06-21: 5 mg via INTRAVENOUS

## 2015-06-21 MED ORDER — PROMETHAZINE HCL 25 MG/ML IJ SOLN: 6.2500 mg | INTRAMUSCULAR | Status: DC | PRN

## 2015-06-21 MED ORDER — FENTANYL CITRATE (PF) 100 MCG/2ML IJ SOLN
INTRAMUSCULAR | Status: AC
  Administered 2015-06-21: 25 ug via INTRAVENOUS
  Filled 2015-06-21: qty 2

## 2015-06-21 MED ORDER — MIDAZOLAM HCL 2 MG/2ML IJ SOLN
INTRAMUSCULAR | Status: DC | PRN
  Administered 2015-06-21 (×2): 1 mg via INTRAVENOUS

## 2015-06-21 SURGICAL SUPPLY — 31 items
BLADE SURG 15 STRL LF DISP TIS (BLADE) ×1 IMPLANT
BLADE SURG 15 STRL SS (BLADE) ×2
CANISTER SUCT 1200ML W/VALVE (MISCELLANEOUS) ×3 IMPLANT
DRAPE LAPAROTOMY 100X77 ABD (DRAPES) ×3 IMPLANT
DRAPE LEGGINS SURG 28X43 STRL (DRAPES) ×3 IMPLANT
DRAPE UNDER BUTTOCK W/FLU (DRAPES) ×3 IMPLANT
GAUZE SPONGE 4X4 12PLY STRL (GAUZE/BANDAGES/DRESSINGS) ×3 IMPLANT
GLOVE BIO SURGEON STRL SZ7.5 (GLOVE) ×15 IMPLANT
GOWN STRL REUS W/ TWL LRG LVL3 (GOWN DISPOSABLE) ×2 IMPLANT
GOWN STRL REUS W/TWL LRG LVL3 (GOWN DISPOSABLE) ×4
HARMONIC SCALPEL FOCUS (MISCELLANEOUS) ×3 IMPLANT
JELLY LUB 2OZ STRL (MISCELLANEOUS) ×2
JELLY LUBE 2OZ STRL (MISCELLANEOUS) ×1 IMPLANT
LABEL OR SOLS (LABEL) ×3 IMPLANT
NDL SAFETY 25GX1.5 (NEEDLE) ×3 IMPLANT
NDL SAFETY ECLIPSE 18X1.5 (NEEDLE) ×1 IMPLANT
NEEDLE HYPO 18GX1.5 SHARP (NEEDLE) ×2
NS IRRIG 500ML POUR BTL (IV SOLUTION) ×3 IMPLANT
PACK BASIN MINOR ARMC (MISCELLANEOUS) ×3 IMPLANT
PAD ABD DERMACEA PRESS 5X9 (GAUZE/BANDAGES/DRESSINGS) IMPLANT
PAD GROUND ADULT SPLIT (MISCELLANEOUS) ×3 IMPLANT
PAD PREP 24X41 OB/GYN DISP (PERSONAL CARE ITEMS) ×3 IMPLANT
SOL PREP PVP 2OZ (MISCELLANEOUS) ×3
SOLUTION PREP PVP 2OZ (MISCELLANEOUS) ×1 IMPLANT
STAPLER PROXIMATE HCS (STAPLE) IMPLANT
STRAP SAFETY BODY (MISCELLANEOUS) ×3 IMPLANT
SUT CHROMIC 2 0 SH (SUTURE) ×3 IMPLANT
SUT CHROMIC 3 0 SH 27 (SUTURE) ×6 IMPLANT
SUT PROLENE 3 0 PS 2 (SUTURE) IMPLANT
SYR 20CC LL (SYRINGE) ×3 IMPLANT
SYRINGE 10CC LL (SYRINGE) ×3 IMPLANT

## 2015-06-21 NOTE — H&P (Signed)
  She reports no change in condition since day of office visit.   Plan  Hemorrhoidectomy as discussed.

## 2015-06-21 NOTE — Addendum Note (Signed)
Addendum  created 06/21/15 1101 by Martha Clan, MD   Modules edited: Anesthesia Events, Narrator   Narrator:  Narrator: Event Log Edited

## 2015-06-21 NOTE — Anesthesia Procedure Notes (Signed)
Procedure Name: LMA Insertion Date/Time: 06/21/2015 7:45 AM Performed by: Allean Found Pre-anesthesia Checklist: Patient identified, Emergency Drugs available, Suction available, Patient being monitored and Timeout performed Patient Re-evaluated:Patient Re-evaluated prior to inductionOxygen Delivery Method: Circle system utilized Preoxygenation: Pre-oxygenation with 100% oxygen Intubation Type: IV induction Ventilation: Mask ventilation without difficulty LMA: LMA inserted LMA Size: 4.0 Number of attempts: 1 Placement Confirmation: positive ETCO2 and breath sounds checked- equal and bilateral Tube secured with: Tape Dental Injury: Teeth and Oropharynx as per pre-operative assessment

## 2015-06-21 NOTE — Anesthesia Postprocedure Evaluation (Signed)
  Anesthesia Post-op Note  Patient: Jessica Houston  Procedure(s) Performed: Procedure(s): Internal and external hemorrhoidectomy (N/A)  Anesthesia type:General  Patient location: PACU  Post pain: Pain level controlled  Post assessment: Post-op Vital signs reviewed, Patient's Cardiovascular Status Stable, Respiratory Function Stable, Patent Airway and No signs of Nausea or vomiting  Post vital signs: Reviewed and stable  Last Vitals:  Filed Vitals:   06/21/15 1031  BP:   Pulse: 78  Temp:   Resp: 14    Level of consciousness: awake, alert  and patient cooperative  Complications: No apparent anesthesia complications

## 2015-06-21 NOTE — Op Note (Signed)
OPERATIVE REPORT  PREOPERATIVE  DIAGNOSIS: . Hemorrhoids  POSTOPERATIVE DIAGNOSIS: Hemorrhoids.  PROCEDURE: . Hemorrhoidectomy  ANESTHESIA:  General  SURGEON: Rochel Brome  MD   INDICATIONS: . She reports a long history of hemorrhoids, pain, bleeding, difficulty cleaning. Large internal and external hemorrhoids were demonstrated on physical exam and surgery was recommended for definitive treatment.  With the patient on the operating table in the supine position she was placed under general anesthesia. The legs were elevated into the lithotomy position using ankle straps. The anal area was prepared with Betadine solution and draped with sterile towels and sheets. Initial inspection revealed there were multiple external hemorrhoids the anoderm was infiltrated with Exparel. The anal canal was dilated large enough to admit 3 fingers. The bivalve anal retractor was introduced. Initial inspection revealed that there were large hemorrhoids found at the 2:00 and 4:00 and 8:00 and 11:00 positions. The hemorrhoid at the 2:00 position was excised by placing a 3-0 chromic suture ligature at the upper end of the internal component. A V-shaped incision was made externally to score the skin. The dissection was begun with electrocautery and then continued with the Harmonic scalpel. The internal anal sphincter was identified. Dissection was carried up to the ligature. The hemorrhoid was further ligated with the same 3-0 chromic suture ligature and excised. The wound was repaired with running locked tied 3-0 chromic suture leaving a small opening externally for drainage.  Similar procedure was carried out at the 11:00 position with a similar excision and closure.  At the 4:00 position a V-shaped incision was made externally and dissected with the Harmonic scalpel over the internal anal sphincter to the upper extent of an internal component the wound was repaired with running 3-0 chromic suture.  At the 8:00  position a V-shaped incision was made externally and dissection was carried up over the internal anal sphincter with the Harmonic scalpel. The wound was closed with running 3-0 chromic suture.  There was also an internal hemorrhoidal polyp in the posterior midline which was ligated with a 2-0 chromic tie and amputated. All specimens were submitted in the same formalin container for routine pathology.  The site was further prepared with Betadine solution and infiltrated the tissues with additional Exparel dressings were applied with paper tape  The patient tolerated the procedure satisfactorily and was then prepared for transfer to the recovery room  [place].D.

## 2015-06-21 NOTE — Transfer of Care (Signed)
Immediate Anesthesia Transfer of Care Note  Patient: Jessica Houston  Procedure(s) Performed: Procedure(s): Internal and external hemorrhoidectomy (N/A)  Patient Location: PACU  Anesthesia Type:General  Level of Consciousness: sedated  Airway & Oxygen Therapy: Patient Spontanous Breathing and Patient connected to face mask oxygen  Post-op Assessment: Report given to RN and Post -op Vital signs reviewed and stable  Post vital signs: Reviewed and stable  Last Vitals:  Filed Vitals:   06/21/15 0937  BP: 147/75  Pulse: 88  Temp: 36.8 C  Resp: 17    Complications: No apparent anesthesia complications

## 2015-06-21 NOTE — Discharge Instructions (Addendum)
Continue usual medicines the same as before surgery. Take Tylenol or Percocet if needed for pain. Continue MiraLAX 1 capful each day. Remove dressing on Wednesday. May then shower and/or sitting in warm water. Tuck gauze or pad in underwear as needed for drainage.   AMBULATORY SURGERY  DISCHARGE INSTRUCTIONS   1) The drugs that you were given will stay in your system until tomorrow so for the next 24 hours you should not:  A) Drive an automobile B) Make any legal decisions C) Drink any alcoholic beverage   2) You may resume regular meals tomorrow.  Today it is better to start with liquids and gradually work up to solid foods.  You may eat anything you prefer, but it is better to start with liquids, then soup and crackers, and gradually work up to solid foods.   3) Please notify your doctor immediately if you have any unusual bleeding, trouble breathing, redness and pain at the surgery site, drainage, fever, or pain not relieved by medication. 4)   5) Your post-operative visit with Dr.                                     is: Date:                        Time:    Please call to schedule your post-operative visit.  6) Additional Instructions: 7)

## 2015-06-21 NOTE — Anesthesia Preprocedure Evaluation (Signed)
Anesthesia Evaluation  Patient identified by MRN, date of birth, ID band Patient awake    Reviewed: Allergy & Precautions, H&P , NPO status , Patient's Chart, lab work & pertinent test results, reviewed documented beta blocker date and time   History of Anesthesia Complications (+) PONV, Family history of anesthesia reaction and history of anesthetic complications  Airway Mallampati: III  TM Distance: >3 FB Neck ROM: full    Dental no notable dental hx.    Pulmonary asthma , sleep apnea ,  breath sounds clear to auscultation  Pulmonary exam normal       Cardiovascular Exercise Tolerance: Good hypertension, - angina- CAD, - Past MI and - CABG negative cardio ROS Normal cardiovascular examRhythm:regular Rate:Normal     Neuro/Psych negative neurological ROS  negative psych ROS   GI/Hepatic Neg liver ROS, GERD-  Medicated,  Endo/Other  diabetes  Renal/GU negative Renal ROS  negative genitourinary   Musculoskeletal   Abdominal   Peds  Hematology negative hematology ROS (+)   Anesthesia Other Findings Past Medical History:   Diabetes mellitus                                              Comment:HgbA1c 6.8%, diet controlled. Optho 2010, urine              microalbumin 10/2010   S/P endoscopy                                                  Comment:normal per pt. done at [facility] clinic   Asthma                                                       Sleep apnea                                                    Comment:On CPAP   Hyperlipidemia                                               Hypertension                                                 Esophageal reflux                                              Comment:CONTROLLED ON CURRENT MEDICATION   Vitamin D deficiency  Bronchitis                                                   Anxiety                                                       Family history of adverse reaction to anesthes*                Comment:dad nausea and vomiting   PONV (postoperative nausea and vomiting)                       Comment:with C-section   Reproductive/Obstetrics negative OB ROS                             Anesthesia Physical Anesthesia Plan  ASA: III  Anesthesia Plan: General   Post-op Pain Management:    Induction:   Airway Management Planned:   Additional Equipment:   Intra-op Plan:   Post-operative Plan:   Informed Consent: I have reviewed the patients History and Physical, chart, labs and discussed the procedure including the risks, benefits and alternatives for the proposed anesthesia with the patient or authorized representative who has indicated his/her understanding and acceptance.   Dental Advisory Given  Plan Discussed with: Anesthesiologist, CRNA and Surgeon  Anesthesia Plan Comments:         Anesthesia Quick Evaluation

## 2015-06-22 LAB — SURGICAL PATHOLOGY

## 2015-07-08 ENCOUNTER — Ambulatory Visit: Payer: BLUE CROSS/BLUE SHIELD | Admitting: Internal Medicine

## 2015-07-08 ENCOUNTER — Encounter: Payer: Self-pay | Admitting: Internal Medicine

## 2015-07-08 ENCOUNTER — Ambulatory Visit (INDEPENDENT_AMBULATORY_CARE_PROVIDER_SITE_OTHER): Payer: BLUE CROSS/BLUE SHIELD | Admitting: Internal Medicine

## 2015-07-08 VITALS — BP 138/74 | HR 84 | Temp 97.8°F | Wt 186.0 lb

## 2015-07-08 DIAGNOSIS — E785 Hyperlipidemia, unspecified: Secondary | ICD-10-CM

## 2015-07-08 DIAGNOSIS — I10 Essential (primary) hypertension: Secondary | ICD-10-CM

## 2015-07-08 DIAGNOSIS — E559 Vitamin D deficiency, unspecified: Secondary | ICD-10-CM

## 2015-07-08 DIAGNOSIS — E119 Type 2 diabetes mellitus without complications: Secondary | ICD-10-CM

## 2015-07-08 DIAGNOSIS — E669 Obesity, unspecified: Secondary | ICD-10-CM

## 2015-07-08 LAB — LIPID PANEL
Cholesterol: 207 mg/dL — ABNORMAL HIGH (ref 0–200)
HDL: 32.9 mg/dL — AB (ref >39.00)
NONHDL: 174.33
TRIGLYCERIDES: 293 mg/dL — AB (ref 0.0–149.0)
Total CHOL/HDL Ratio: 6
VLDL: 58.6 mg/dL — ABNORMAL HIGH (ref 0.0–40.0)

## 2015-07-08 LAB — COMPREHENSIVE METABOLIC PANEL
ALBUMIN: 4.1 g/dL (ref 3.5–5.2)
ALK PHOS: 52 U/L (ref 39–117)
ALT: 38 U/L — AB (ref 0–35)
AST: 23 U/L (ref 0–37)
BUN: 16 mg/dL (ref 6–23)
CO2: 28 mEq/L (ref 19–32)
Calcium: 9.7 mg/dL (ref 8.4–10.5)
Chloride: 97 mEq/L (ref 96–112)
Creatinine, Ser: 0.83 mg/dL (ref 0.40–1.20)
GFR: 77.52 mL/min (ref >60.00)
Glucose, Bld: 120 mg/dL — ABNORMAL HIGH (ref 70–99)
POTASSIUM: 3.8 meq/L (ref 3.5–5.1)
Sodium: 137 mEq/L (ref 135–145)
Total Bilirubin: 0.4 mg/dL (ref 0.2–1.2)
Total Protein: 7 g/dL (ref 6.0–8.3)

## 2015-07-08 LAB — HEMOGLOBIN A1C: HEMOGLOBIN A1C: 6.4 % (ref 4.6–6.5)

## 2015-07-08 LAB — LDL CHOLESTEROL, DIRECT: Direct LDL: 141 mg/dL

## 2015-07-08 LAB — VITAMIN D 25 HYDROXY (VIT D DEFICIENCY, FRACTURES): VITD: 35.26 ng/mL (ref 30.00–100.00)

## 2015-07-08 LAB — MICROALBUMIN / CREATININE URINE RATIO
Creatinine,U: 113.6 mg/dL
MICROALB UR: 1.1 mg/dL (ref 0.0–1.9)
MICROALB/CREAT RATIO: 1 mg/g (ref 0.0–30.0)

## 2015-07-08 NOTE — Assessment & Plan Note (Signed)
BP Readings from Last 3 Encounters:  07/08/15 138/74  06/21/15 154/73  06/13/15 152/71   BP well controlled. Renal function with labs. Will try decrease in Bystolic to 2.5mg  daily.

## 2015-07-08 NOTE — Progress Notes (Signed)
Subjective:    Patient ID: Jessica Houston, female    DOB: 05-13-1966, 49 y.o.   MRN: [id number]  HPI  49YO female presents for follow up.  DM - BG well controlled. Did not bring record today. Compliant with medications.  Would like to stop Bystolic. Feels that this medication has contributed to weight gain. No recent palpitations, chest pain.  Recently had hemorroidectomy. Pain well controlled. Having some diarrhea though.  Wt Readings from Last 3 Encounters:  07/08/15 186 lb (84.369 kg)  06/13/15 190 lb (86.183 kg)  04/01/15 192 lb (87.091 kg)   BP Readings from Last 3 Encounters:  07/08/15 138/74  06/21/15 154/73  06/13/15 152/71     Past Medical History  Diagnosis Date  . Diabetes mellitus     HgbA1c 6.8%, diet controlled. Optho 2010, urine microalbumin 10/2010  . S/P endoscopy     normal per pt. done at [facility] clinic  . Asthma   . Sleep apnea     On CPAP  . Hyperlipidemia   . Hypertension   . Esophageal reflux     CONTROLLED ON CURRENT MEDICATION  . Vitamin D deficiency   . Bronchitis   . Anxiety   . Family history of adverse reaction to anesthesia     dad nausea and vomiting  . PONV (postoperative nausea and vomiting)     with C-section   No family history on file. Past Surgical History  Procedure Laterality Date  . Cesarean section N/A   . Hemorrhoid surgery N/A 06/21/2015    Procedure: Internal and external hemorrhoidectomy;  Surgeon: Leonie Green, MD;  Location: [facility] ORS;  Service: General;  Laterality: N/A;   Social History   Social History  . Marital Status: Married    Spouse Name: N/A  . Number of Children: N/A  . Years of Education: N/A   Social History Main Topics  . Smoking status: Never Smoker   . Smokeless tobacco: Never Used  . Alcohol Use: 0.0 - 0.6 oz/week    0-1 Cans of beer per week     Comment: rarely  . Drug Use: No  . Sexual Activity: Yes    Birth Control/ Protection: Surgical     Comment: BTL   Other Topics  Concern  . Not on file   Social History Narrative    Review of Systems  Constitutional: Negative for fever, chills, appetite change, fatigue and unexpected weight change.  Eyes: Negative for visual disturbance.  Respiratory: Negative for shortness of breath.   Cardiovascular: Negative for chest pain, palpitations and leg swelling.  Gastrointestinal: Positive for rectal pain. Negative for nausea, vomiting, abdominal pain, diarrhea and constipation.  Musculoskeletal: Negative for myalgias and arthralgias.  Skin: Negative for color change and rash.  Hematological: Negative for adenopathy. Does not bruise/bleed easily.  Psychiatric/Behavioral: Negative for sleep disturbance and dysphoric mood. The patient is not nervous/anxious.        Objective:    BP 138/74 mmHg  Pulse 84  Temp(Src) 97.8 F (36.6 C) (Oral)  Wt 186 lb (84.369 kg)  SpO2 98%  LMP 06/11/2015 Physical Exam  Constitutional: She is oriented to person, place, and time. She appears well-developed and well-nourished. No distress.  HENT:  Head: Normocephalic and atraumatic.  Right Ear: External ear normal.  Left Ear: External ear normal.  Nose: Nose normal.  Mouth/Throat: Oropharynx is clear and moist. No oropharyngeal exudate.  Eyes: Conjunctivae are normal. Pupils are equal, round, and reactive to light. Right eye exhibits  no discharge. Left eye exhibits no discharge. No scleral icterus.  Neck: Normal range of motion. Neck supple. No tracheal deviation present. No thyromegaly present.  Cardiovascular: Normal rate, regular rhythm, normal heart sounds and intact distal pulses.  Exam reveals no gallop and no friction rub.   No murmur heard. Pulmonary/Chest: Effort normal and breath sounds normal. No respiratory distress. She has no wheezes. She has no rales. She exhibits no tenderness.  Musculoskeletal: Normal range of motion. She exhibits no edema or tenderness.  Lymphadenopathy:    She has no cervical adenopathy.    Neurological: She is alert and oriented to person, place, and time. No cranial nerve deficit. She exhibits normal muscle tone. Coordination normal.  Skin: Skin is warm and dry. No rash noted. She is not diaphoretic. No erythema. No pallor.  Psychiatric: She has a normal mood and affect. Her behavior is normal. Judgment and thought content normal.          Assessment & Plan:   Problem List Items Addressed This Visit      Unprioritized   Diabetes mellitus type 2, controlled - Primary    Will check A1c with labs. Continue Metformin.      Relevant Orders   Comprehensive metabolic panel   Hemoglobin A1c   Lipid panel   Microalbumin / creatinine urine ratio   Hyperlipidemia    Will check lipids with labs. Pt has stopped her Crestor.      Hypertension    BP Readings from Last 3 Encounters:  07/08/15 138/74  06/21/15 154/73  06/13/15 152/71   BP well controlled. Renal function with labs. Will try decrease in Bystolic to 2.5mg  daily.      Obesity (BMI 30-39.9)    Wt Readings from Last 3 Encounters:  07/08/15 186 lb (84.369 kg)  06/13/15 190 lb (86.183 kg)  04/01/15 192 lb (87.091 kg)   Congratulated pt on weight loss. Encouraged healthy diet and exercise.      Vitamin D deficiency    Repeat Vit D today.      Relevant Orders   Vitamin D (25 hydroxy)       Return in about 3 months (around 10/08/2015) for Recheck of Diabetes.

## 2015-07-08 NOTE — Assessment & Plan Note (Signed)
Wt Readings from Last 3 Encounters:  07/08/15 186 lb (84.369 kg)  06/13/15 190 lb (86.183 kg)  04/01/15 192 lb (87.091 kg)   Congratulated pt on weight loss. Encouraged healthy diet and exercise.

## 2015-07-08 NOTE — Progress Notes (Signed)
Pre visit review using our clinic review tool, if applicable. No additional management support is needed unless otherwise documented below in the visit note. 

## 2015-07-08 NOTE — Assessment & Plan Note (Signed)
Will check A1c with labs. Continue Metformin. 

## 2015-07-08 NOTE — Patient Instructions (Addendum)
Labs today.  Trying tapering Bystolic to 2.5mg  daily. Monitor BP at home.  Follow up in 3 months.

## 2015-07-08 NOTE — Assessment & Plan Note (Signed)
Will check lipids with labs. Pt has stopped her Crestor.

## 2015-07-08 NOTE — Assessment & Plan Note (Signed)
Repeat Vit D today 

## 2015-07-10 ENCOUNTER — Encounter: Payer: Self-pay | Admitting: Internal Medicine

## 2015-07-11 ENCOUNTER — Other Ambulatory Visit: Payer: Self-pay | Admitting: *Deleted

## 2015-07-11 ENCOUNTER — Encounter: Payer: Self-pay | Admitting: Internal Medicine

## 2015-07-11 MED ORDER — ROSUVASTATIN CALCIUM 5 MG PO TABS: 5.0000 mg | ORAL_TABLET | Freq: Every day | ORAL | Status: DC

## 2015-07-12 ENCOUNTER — Telehealth: Payer: Self-pay | Admitting: Internal Medicine

## 2015-07-12 NOTE — Telephone Encounter (Signed)
Letter with Almyra Free.

## 2015-07-12 NOTE — Telephone Encounter (Signed)
Pt called needing a copy of a note for work from Dr Gilford Rile. Pt states Dr walker states that the letter was printed yesterday. I looked in the drawer not showing the letter. Pt daughter is coming today @2pm  she wants her daughter to pick it up. Thank You!

## 2015-08-08 ENCOUNTER — Other Ambulatory Visit: Payer: Self-pay | Admitting: *Deleted

## 2015-08-08 DIAGNOSIS — Z139 Encounter for screening, unspecified: Secondary | ICD-10-CM

## 2015-08-08 DIAGNOSIS — Z1159 Encounter for screening for other viral diseases: Secondary | ICD-10-CM

## 2015-08-09 ENCOUNTER — Ambulatory Visit (INDEPENDENT_AMBULATORY_CARE_PROVIDER_SITE_OTHER): Payer: BC Managed Care – PPO

## 2015-08-09 ENCOUNTER — Other Ambulatory Visit: Payer: BLUE CROSS/BLUE SHIELD

## 2015-08-09 ENCOUNTER — Other Ambulatory Visit (INDEPENDENT_AMBULATORY_CARE_PROVIDER_SITE_OTHER): Payer: BC Managed Care – PPO

## 2015-08-09 ENCOUNTER — Telehealth: Payer: Self-pay | Admitting: *Deleted

## 2015-08-09 DIAGNOSIS — Z23 Encounter for immunization: Secondary | ICD-10-CM

## 2015-08-09 DIAGNOSIS — Z111 Encounter for screening for respiratory tuberculosis: Secondary | ICD-10-CM

## 2015-08-09 DIAGNOSIS — Z139 Encounter for screening, unspecified: Secondary | ICD-10-CM | POA: Diagnosis not present

## 2015-08-09 MED ORDER — TETANUS-DIPHTH-ACELL PERTUSSIS 5-2.5-18.5 LF-MCG/0.5 IM SUSP
0.5000 mL | Freq: Once | INTRAMUSCULAR | Status: AC
  Administered 2015-08-09: 0.5 mL via INTRAMUSCULAR

## 2015-08-09 NOTE — Telephone Encounter (Signed)
screening

## 2015-08-09 NOTE — Telephone Encounter (Signed)
MMR titer and Hep B surface antibody

## 2015-08-09 NOTE — Telephone Encounter (Signed)
What dx?

## 2015-08-09 NOTE — Telephone Encounter (Signed)
Labs and dx?  

## 2015-08-10 LAB — MEASLES/MUMPS/RUBELLA IMMUNITY: RUBELLA: 5.85 {index} — AB (ref <0.90)

## 2015-08-10 LAB — HEPATITIS B SURFACE ANTIBODY,QUALITATIVE: Hep B S Ab: POSITIVE — AB

## 2015-08-11 ENCOUNTER — Ambulatory Visit (INDEPENDENT_AMBULATORY_CARE_PROVIDER_SITE_OTHER): Payer: BC Managed Care – PPO

## 2015-08-11 DIAGNOSIS — Z23 Encounter for immunization: Secondary | ICD-10-CM | POA: Diagnosis not present

## 2015-08-11 LAB — TB SKIN TEST
INDURATION: 0 mm
TB SKIN TEST: NEGATIVE

## 2015-08-12 ENCOUNTER — Encounter: Payer: Self-pay | Admitting: Internal Medicine

## 2015-08-12 ENCOUNTER — Telehealth: Payer: Self-pay | Admitting: Internal Medicine

## 2015-08-12 NOTE — Telephone Encounter (Signed)
Most children get the MMR vaccine at 49 year of age. Is she working around infants?

## 2015-08-12 NOTE — Telephone Encounter (Signed)
Pt called needing a letter stating it's ok to work around children being that she got the shot yesterday? Thank You!

## 2015-08-15 ENCOUNTER — Encounter: Payer: Self-pay | Admitting: *Deleted

## 2015-08-31 ENCOUNTER — Other Ambulatory Visit: Payer: Self-pay | Admitting: Internal Medicine

## 2015-09-01 ENCOUNTER — Encounter: Payer: Self-pay | Admitting: Internal Medicine

## 2015-09-08 ENCOUNTER — Ambulatory Visit: Payer: BC Managed Care – PPO

## 2015-09-08 ENCOUNTER — Ambulatory Visit (INDEPENDENT_AMBULATORY_CARE_PROVIDER_SITE_OTHER): Payer: BC Managed Care – PPO

## 2015-09-08 DIAGNOSIS — Z23 Encounter for immunization: Secondary | ICD-10-CM

## 2015-09-08 NOTE — Progress Notes (Signed)
Patient came in for second MMR, Discussed with Dr. Gilford Rile if this patient needed two vaccines.  Last MMR was on 08/11/15.  Okayed with Dr. Gilford Rile to give second dose today.  She agreed it was okay.  Received in Right arm.   Patient tolerated well.

## 2015-10-06 ENCOUNTER — Other Ambulatory Visit: Payer: Self-pay | Admitting: Internal Medicine

## 2015-10-07 ENCOUNTER — Encounter: Payer: Self-pay | Admitting: Internal Medicine

## 2015-10-07 ENCOUNTER — Ambulatory Visit (INDEPENDENT_AMBULATORY_CARE_PROVIDER_SITE_OTHER): Payer: BC Managed Care – PPO | Admitting: Internal Medicine

## 2015-10-07 VITALS — BP 150/76 | HR 73 | Temp 98.0°F | Ht 62.0 in | Wt 186.0 lb

## 2015-10-07 DIAGNOSIS — M545 Low back pain, unspecified: Secondary | ICD-10-CM | POA: Insufficient documentation

## 2015-10-07 DIAGNOSIS — E119 Type 2 diabetes mellitus without complications: Secondary | ICD-10-CM | POA: Diagnosis not present

## 2015-10-07 DIAGNOSIS — Z1239 Encounter for other screening for malignant neoplasm of breast: Secondary | ICD-10-CM

## 2015-10-07 DIAGNOSIS — I10 Essential (primary) hypertension: Secondary | ICD-10-CM

## 2015-10-07 DIAGNOSIS — Z23 Encounter for immunization: Secondary | ICD-10-CM | POA: Diagnosis not present

## 2015-10-07 LAB — LIPID PANEL
Cholesterol: 228 mg/dL — ABNORMAL HIGH (ref 0–200)
HDL: 38.5 mg/dL — AB (ref >39.00)
LDL CALC: 155 mg/dL — AB (ref 0–99)
NONHDL: 189.81
Total CHOL/HDL Ratio: 6
Triglycerides: 172 mg/dL — ABNORMAL HIGH (ref 0.0–149.0)
VLDL: 34.4 mg/dL (ref 0.0–40.0)

## 2015-10-07 LAB — COMPREHENSIVE METABOLIC PANEL
ALT: 34 U/L (ref 0–35)
AST: 28 U/L (ref 0–37)
Albumin: 4.2 g/dL (ref 3.5–5.2)
Alkaline Phosphatase: 58 U/L (ref 39–117)
BUN: 16 mg/dL (ref 6–23)
CHLORIDE: 99 meq/L (ref 96–112)
CO2: 27 meq/L (ref 19–32)
CREATININE: 0.82 mg/dL (ref 0.40–1.20)
Calcium: 9.7 mg/dL (ref 8.4–10.5)
GFR: 78.53 mL/min (ref >60.00)
Glucose, Bld: 112 mg/dL — ABNORMAL HIGH (ref 70–99)
POTASSIUM: 4.2 meq/L (ref 3.5–5.1)
SODIUM: 137 meq/L (ref 135–145)
Total Bilirubin: 0.4 mg/dL (ref 0.2–1.2)
Total Protein: 7.1 g/dL (ref 6.0–8.3)

## 2015-10-07 LAB — HEMOGLOBIN A1C: HEMOGLOBIN A1C: 6.9 % — AB (ref 4.6–6.5)

## 2015-10-07 LAB — MICROALBUMIN / CREATININE URINE RATIO
Creatinine,U: 65.6 mg/dL
Microalb Creat Ratio: 1.4 mg/g (ref 0.0–30.0)
Microalb, Ur: 0.9 mg/dL (ref 0.0–1.9)

## 2015-10-07 LAB — VITAMIN D 25 HYDROXY (VIT D DEFICIENCY, FRACTURES): VITD: 42.58 ng/mL (ref 30.00–100.00)

## 2015-10-07 NOTE — Assessment & Plan Note (Signed)
Does not check BG. Will check A1c with labs. Continue current medication.

## 2015-10-07 NOTE — Patient Instructions (Addendum)
Consider starting physical therapy to help with back pain.

## 2015-10-07 NOTE — Assessment & Plan Note (Signed)
BP Readings from Last 3 Encounters:  10/07/15 150/76  07/08/15 138/74  06/21/15 154/73   Encouraged her to go back on the full 5mg  dose of Bystolic.

## 2015-10-07 NOTE — Assessment & Plan Note (Signed)
Low back aching pain likely muscular strain. Exam normal. Unclear what exam she is referring to from past with "fluid" in back. Will request notes from [facility]. Encouraged PT. She declines. Continue Meloxicam.

## 2015-10-07 NOTE — Progress Notes (Addendum)
Subjective:    Patient ID: Jessica Houston, female    DOB: [birth year], 49 y.o.   MRN: [id number]  HPI  49YO female presents for follow up.  DM - Not checking BG. Compliant with meds.  Low back pain - Aching pain in lower back over last few weeks. Occurred in past, diagnosed with fluid in pelvis? Not taking anything for this. Worsened by prolonged standing. Improved with rest.  HTN - Does not check BP at home. Taking only 1/2 dose of Bystolic. No CP, HA, palpitation.  Wt Readings from Last 3 Encounters:  10/07/15 186 lb (84.369 kg)  07/08/15 186 lb (84.369 kg)  06/13/15 190 lb (86.183 kg)   BP Readings from Last 3 Encounters:  10/07/15 150/76  07/08/15 138/74  06/21/15 154/73    Past Medical History  Diagnosis Date  . Diabetes mellitus     HgbA1c 6.8%, diet controlled. Optho 2010, urine microalbumin 10/2010  . S/P endoscopy     normal per pt. done at [facility] clinic  . Asthma   . Sleep apnea     On CPAP  . Hyperlipidemia   . Hypertension   . Esophageal reflux     CONTROLLED ON CURRENT MEDICATION  . Vitamin D deficiency   . Bronchitis   . Anxiety   . Family history of adverse reaction to anesthesia     dad nausea and vomiting  . PONV (postoperative nausea and vomiting)     with C-section   No family history on file. Past Surgical History  Procedure Laterality Date  . Cesarean section N/A   . Hemorrhoid surgery N/A 06/21/2015    Procedure: Internal and external hemorrhoidectomy;  Surgeon: Leonie Green, MD;  Location: [facility] ORS;  Service: General;  Laterality: N/A;   Social History   Social History  . Marital Status: Married    Spouse Name: N/A  . Number of Children: N/A  . Years of Education: N/A   Social History Main Topics  . Smoking status: Never Smoker   . Smokeless tobacco: Never Used  . Alcohol Use: 0.0 - 0.6 oz/week    0-1 Cans of beer per week     Comment: rarely  . Drug Use: No  . Sexual Activity: Yes    Birth Control/ Protection:  Surgical     Comment: BTL   Other Topics Concern  . None   Social History Narrative    Review of Systems  Constitutional: Negative for fever, chills, appetite change, fatigue and unexpected weight change.  Eyes: Negative for visual disturbance.  Respiratory: Negative for cough, chest tightness and shortness of breath.   Cardiovascular: Negative for chest pain and leg swelling.  Gastrointestinal: Negative for nausea, vomiting, abdominal pain, diarrhea and constipation.  Musculoskeletal: Positive for myalgias, back pain and arthralgias.  Skin: Negative for color change and rash.  Hematological: Negative for adenopathy. Does not bruise/bleed easily.  Psychiatric/Behavioral: Negative for sleep disturbance and dysphoric mood. The patient is not nervous/anxious.        Objective:    BP 150/76 mmHg  Pulse 73  Temp(Src) 98 F (36.7 C) (Oral)  Ht 5\' 2"  (1.575 m)  Wt 186 lb (84.369 kg)  BMI 34.01 kg/m2  SpO2 99%  LMP 10/02/2015 (Approximate) Physical Exam  Constitutional: She is oriented to person, place, and time. She appears well-developed and well-nourished. No distress.  HENT:  Head: Normocephalic and atraumatic.  Right Ear: External ear normal.  Left Ear: External ear normal.  Nose: Nose normal.  Mouth/Throat: Oropharynx is clear and moist. No oropharyngeal exudate.  Eyes: Conjunctivae are normal. Pupils are equal, round, and reactive to light. Right eye exhibits no discharge. Left eye exhibits no discharge. No scleral icterus.  Neck: Normal range of motion. Neck supple. No tracheal deviation present. No thyromegaly present.  Cardiovascular: Normal rate, regular rhythm, normal heart sounds and intact distal pulses.  Exam reveals no gallop and no friction rub.   No murmur heard. Pulmonary/Chest: Effort normal and breath sounds normal. No respiratory distress. She has no wheezes. She has no rales. She exhibits no tenderness.  Musculoskeletal: Normal range of motion. She  exhibits no edema or tenderness.       Lumbar back: She exhibits pain. She exhibits normal range of motion, no tenderness, no edema and no deformity.  Lymphadenopathy:    She has no cervical adenopathy.  Neurological: She is alert and oriented to person, place, and time. No cranial nerve deficit. She exhibits normal muscle tone. Coordination normal.  Skin: Skin is warm and dry. No rash noted. She is not diaphoretic. No erythema. No pallor.  Psychiatric: She has a normal mood and affect. Her behavior is normal. Judgment and thought content normal.          Assessment & Plan:   Problem List Items Addressed This Visit      Unprioritized   Diabetes mellitus type 2, controlled ([place]) - Primary    Does not check BG. Will check A1c with labs. Continue current medication.      Relevant Orders   Comprehensive metabolic panel   Hemoglobin A1c   Lipid panel   Microalbumin / creatinine urine ratio   Hypertension    BP Readings from Last 3 Encounters:  10/07/15 150/76  07/08/15 138/74  06/21/15 154/73   Encouraged her to go back on the full 5mg  dose of Bystolic.      Low back pain    Low back aching pain likely muscular strain. Exam normal. Unclear what exam she is referring to from past with "fluid" in back. Will request notes from [facility]. Encouraged PT. She declines. Continue Meloxicam.      Relevant Orders   VITAMIN D 25 Hydroxy (Vit-D Deficiency, Fractures)    Other Visit Diagnoses    Encounter for immunization        Screening for breast cancer        Relevant Orders    MM Digital Screening        Return in about 3 months (around 01/07/2016) for Recheck of Diabetes.

## 2015-10-07 NOTE — Progress Notes (Signed)
Pre visit review using our clinic review tool, if applicable. No additional management support is needed unless otherwise documented below in the visit note. 

## 2015-10-10 ENCOUNTER — Encounter: Payer: Self-pay | Admitting: Internal Medicine

## 2015-10-12 ENCOUNTER — Encounter: Payer: Self-pay | Admitting: Internal Medicine

## 2015-10-21 ENCOUNTER — Encounter: Payer: Self-pay | Admitting: Internal Medicine

## 2015-10-25 ENCOUNTER — Telehealth: Payer: Self-pay | Admitting: Internal Medicine

## 2015-10-25 NOTE — Telephone Encounter (Signed)
There is a 1:30 open tomorrow

## 2015-10-25 NOTE — Telephone Encounter (Signed)
Pt is scheduled for tomorrow. Thank You!

## 2015-10-25 NOTE — Telephone Encounter (Signed)
Pt called about her right leg is painful. Pt only wanted to see Dr Gilford Rile no other provider. Let me know where Dr Gilford Rile says we can sch her. Thank You!

## 2015-10-26 ENCOUNTER — Ambulatory Visit (INDEPENDENT_AMBULATORY_CARE_PROVIDER_SITE_OTHER): Payer: BC Managed Care – PPO | Admitting: Internal Medicine

## 2015-10-26 ENCOUNTER — Encounter: Payer: Self-pay | Admitting: Internal Medicine

## 2015-10-26 ENCOUNTER — Ambulatory Visit
Admission: RE | Admit: 2015-10-26 | Discharge: 2015-10-26 | Disposition: A | Discharge status: Home / Self Care | Payer: BC Managed Care – PPO | Source: Ambulatory Visit | Attending: Internal Medicine | Admitting: Internal Medicine

## 2015-10-26 VITALS — BP 143/84 | HR 77 | Temp 98.1°F | Ht 62.0 in | Wt 189.0 lb

## 2015-10-26 DIAGNOSIS — M25551 Pain in right hip: Secondary | ICD-10-CM | POA: Insufficient documentation

## 2015-10-26 DIAGNOSIS — M160 Bilateral primary osteoarthritis of hip: Secondary | ICD-10-CM | POA: Insufficient documentation

## 2015-10-26 MED ORDER — MELOXICAM 15 MG PO TABS: 15.0000 mg | ORAL_TABLET | Freq: Every day | ORAL | Status: DC

## 2015-10-26 NOTE — Assessment & Plan Note (Signed)
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 Will get plain xray. Start Meloxicam 15mg  daily. Encouraged icing and gentle stretching. Follow up by phone or email in 1 week. If no improvement, discussed referral to Sports Medicine.

## 2015-10-26 NOTE — Patient Instructions (Signed)
Ice area over right hip 2-3 times daily for 20-73min.  [facility]  Start Meloxicam 15mg  daily.  Email or call with update next week.

## 2015-10-26 NOTE — Progress Notes (Signed)
Subjective:    Patient ID: Jessica Houston, female    DOB: Aug 08, 1966, 49 y.o.   MRN: [id number]  HPI  49YO female presents for acute visit.  Right leg pain - Having pain in lateral right thigh over last few months. Has had sciatic pain before which was similar, about 8 years ago. No weakness. No numbness. No trauma. Not taking anything except for occasional Advil with some improvement.   Wt Readings from Last 3 Encounters:  10/26/15 189 lb (85.73 kg)  10/07/15 186 lb (84.369 kg)  07/08/15 186 lb (84.369 kg)   BP Readings from Last 3 Encounters:  10/26/15 143/84  10/07/15 150/76  07/08/15 138/74    Past Medical History  Diagnosis Date  . Diabetes mellitus     HgbA1c 6.8%, diet controlled. Optho 2010, urine microalbumin 10/2010  . S/P endoscopy     normal per pt. done at [facility] clinic  . Asthma   . Sleep apnea     On CPAP  . Hyperlipidemia   . Hypertension   . Esophageal reflux     CONTROLLED ON CURRENT MEDICATION  . Vitamin D deficiency   . Bronchitis   . Anxiety   . Family history of adverse reaction to anesthesia     dad nausea and vomiting  . PONV (postoperative nausea and vomiting)     with C-section   No family history on file. Past Surgical History  Procedure Laterality Date  . Cesarean section N/A   . Hemorrhoid surgery N/A 06/21/2015    Procedure: Internal and external hemorrhoidectomy;  Surgeon: Leonie Green, MD;  Location: [facility] ORS;  Service: General;  Laterality: N/A;   Social History   Social History  . Marital Status: Married    Spouse Name: N/A  . Number of Children: N/A  . Years of Education: N/A   Social History Main Topics  . Smoking status: Never Smoker   . Smokeless tobacco: Never Used  . Alcohol Use: 0.0 - 0.6 oz/week    0-1 Cans of beer per week     Comment: rarely  . Drug Use: No  . Sexual Activity: Yes    Birth Control/ Protection: Surgical     Comment: BTL   Other Topics Concern  . None   Social History  Narrative    Review of Systems  Constitutional: Negative for fever, chills, appetite change, fatigue and unexpected weight change.  Eyes: Negative for visual disturbance.  Respiratory: Negative for shortness of breath.   Cardiovascular: Negative for chest pain and leg swelling.  Gastrointestinal: Negative for abdominal pain.  Musculoskeletal: Positive for myalgias and arthralgias. Negative for back pain and gait problem.  Skin: Negative for color change and rash.  Neurological: Negative for weakness and numbness.  Hematological: Negative for adenopathy. Does not bruise/bleed easily.  Psychiatric/Behavioral: Negative for sleep disturbance and dysphoric mood. The patient is not nervous/anxious.        Objective:    BP 143/84 mmHg  Pulse 77  Temp(Src) 98.1 F (36.7 C) (Oral)  Ht 5\' 2"  (1.575 m)  Wt 189 lb (85.73 kg)  BMI 34.56 kg/m2  SpO2 98%  LMP 10/26/2015 Physical Exam  Constitutional: She is oriented to person, place, and time. She appears well-developed and well-nourished. No distress.  HENT:  Head: Normocephalic and atraumatic.  Right Ear: External ear normal.  Left Ear: External ear normal.  Nose: Nose normal.  Mouth/Throat: Oropharynx is clear and moist.  Eyes: Conjunctivae are normal. Pupils are equal, round,  and reactive to light. Right eye exhibits no discharge. Left eye exhibits no discharge. No scleral icterus.  Neck: Normal range of motion. Neck supple. No tracheal deviation present. No thyromegaly present.  Pulmonary/Chest: Effort normal. No accessory muscle usage. No tachypnea. She has no decreased breath sounds. She has no rhonchi.  Musculoskeletal: Normal range of motion. She exhibits no edema.       Right hip: She exhibits tenderness. She exhibits normal range of motion, normal strength, no swelling and no deformity.       Legs: Lymphadenopathy:    She has no cervical adenopathy.  Neurological: She is alert and oriented to person, place, and time. No  cranial nerve deficit. She exhibits normal muscle tone. Coordination normal.  Skin: Skin is warm and dry. No rash noted. She is not diaphoretic. No erythema. No pallor.  Psychiatric: She has a normal mood and affect. Her behavior is normal. Judgment and thought content normal.          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 Will get plain xray. Start Meloxicam 15mg  daily. Encouraged icing and gentle stretching. Follow up by phone or email in 1 week. If no improvement, discussed referral to Sports Medicine.      Relevant Medications   meloxicam (MOBIC) 15 MG tablet   Other Relevant Orders   DG HIP UNILAT WITH PELVIS 2-3 VIEWS RIGHT       Return in about 4 weeks (around 11/23/2015) for Recheck.

## 2015-10-26 NOTE — Progress Notes (Signed)
Pre visit review using our clinic review tool, if applicable. No additional management support is needed unless otherwise documented below in the visit note. 

## 2015-10-27 ENCOUNTER — Encounter: Payer: Self-pay | Admitting: Internal Medicine

## 2015-10-27 DIAGNOSIS — M160 Bilateral primary osteoarthritis of hip: Secondary | ICD-10-CM

## 2015-11-07 ENCOUNTER — Other Ambulatory Visit: Payer: Self-pay | Admitting: Internal Medicine

## 2015-11-08 ENCOUNTER — Telehealth: Payer: Self-pay | Admitting: Internal Medicine

## 2015-11-08 NOTE — Telephone Encounter (Signed)
Left msg to call office for mammo appt details/msn

## 2015-11-09 ENCOUNTER — Telehealth: Payer: Self-pay | Admitting: Internal Medicine

## 2015-11-09 NOTE — Telephone Encounter (Signed)
Patient called needing prior authorization for pantoprazole (PROTONIX) 40 MG tablet. The patient is completley out of medication.

## 2015-11-10 ENCOUNTER — Encounter: Payer: Self-pay | Admitting: Internal Medicine

## 2015-11-15 NOTE — Telephone Encounter (Signed)
Completed PA on Cover My Meds.  Will follow up. thanks

## 2015-11-16 ENCOUNTER — Encounter: Payer: Self-pay | Admitting: Internal Medicine

## 2015-11-17 NOTE — Telephone Encounter (Signed)
Pharmacy called requesting PA outcome, no outcome yet, states it could be 1-5 days for approval response.

## 2015-11-18 ENCOUNTER — Ambulatory Visit
Admission: RE | Admit: 2015-11-18 | Discharge: 2015-11-18 | Disposition: A | Discharge status: Home / Self Care | Payer: BC Managed Care – PPO | Source: Ambulatory Visit | Attending: Internal Medicine | Admitting: Internal Medicine

## 2015-11-18 DIAGNOSIS — Z1231 Encounter for screening mammogram for malignant neoplasm of breast: Secondary | ICD-10-CM | POA: Diagnosis present

## 2015-11-18 DIAGNOSIS — Z1239 Encounter for other screening for malignant neoplasm of breast: Secondary | ICD-10-CM

## 2015-11-18 HISTORY — DX: Malignant (primary) neoplasm, unspecified: C80.1

## 2015-11-18 NOTE — Telephone Encounter (Signed)
Ok. Np. Do you want me to call the pt back with the response?

## 2015-11-18 NOTE — Telephone Encounter (Signed)
Spoke with [employer], PA completed on the phone.  Approved for dates 10/19/15 through 11/17/16 case # [id number]

## 2015-11-18 NOTE — Telephone Encounter (Signed)
Pt called back about her PA for medication and pt states she called the insurance company and they stated that they don't have the PA. I do see where it was follow up on. Thank You!

## 2015-11-18 NOTE — Telephone Encounter (Signed)
Ok. Thank You! °

## 2015-11-18 NOTE — Telephone Encounter (Signed)
I have checked again today, I will check the fax, this specific one has that they will fax a response within 1-5 days.  I haven't seen a response yet.

## 2015-11-18 NOTE — Telephone Encounter (Signed)
You can, I will let her know when I get the approval/denial. Thanks

## 2015-12-19 ENCOUNTER — Other Ambulatory Visit: Payer: Self-pay | Admitting: Internal Medicine

## 2015-12-30 ENCOUNTER — Other Ambulatory Visit: Payer: Self-pay | Admitting: Internal Medicine

## 2016-01-12 ENCOUNTER — Telehealth: Payer: Self-pay | Admitting: Internal Medicine

## 2016-01-13 ENCOUNTER — Ambulatory Visit (INDEPENDENT_AMBULATORY_CARE_PROVIDER_SITE_OTHER): Payer: BC Managed Care – PPO | Admitting: Internal Medicine

## 2016-01-13 ENCOUNTER — Encounter: Payer: Self-pay | Admitting: Internal Medicine

## 2016-01-13 VITALS — BP 132/93 | HR 81 | Temp 97.5°F | Ht 62.0 in | Wt 183.5 lb

## 2016-01-13 DIAGNOSIS — E785 Hyperlipidemia, unspecified: Secondary | ICD-10-CM | POA: Diagnosis not present

## 2016-01-13 DIAGNOSIS — I10 Essential (primary) hypertension: Secondary | ICD-10-CM | POA: Diagnosis not present

## 2016-01-13 DIAGNOSIS — E119 Type 2 diabetes mellitus without complications: Secondary | ICD-10-CM | POA: Diagnosis not present

## 2016-01-13 LAB — HEMOGLOBIN A1C: Hgb A1c MFr Bld: 7.1 % — ABNORMAL HIGH (ref 4.6–6.5)

## 2016-01-13 LAB — LIPID PANEL
CHOL/HDL RATIO: 5
Cholesterol: 209 mg/dL — ABNORMAL HIGH (ref 0–200)
HDL: 39.9 mg/dL (ref >39.00)
LDL Cholesterol: 137 mg/dL — ABNORMAL HIGH (ref 0–99)
NONHDL: 169.04
Triglycerides: 162 mg/dL — ABNORMAL HIGH (ref 0.0–149.0)
VLDL: 32.4 mg/dL (ref 0.0–40.0)

## 2016-01-13 LAB — COMPREHENSIVE METABOLIC PANEL
ALBUMIN: 4.5 g/dL (ref 3.5–5.2)
ALK PHOS: 54 U/L (ref 39–117)
ALT: 29 U/L (ref 0–35)
AST: 27 U/L (ref 0–37)
BUN: 20 mg/dL (ref 6–23)
CHLORIDE: 98 meq/L (ref 96–112)
CO2: 28 mEq/L (ref 19–32)
CREATININE: 0.86 mg/dL (ref 0.40–1.20)
Calcium: 9.9 mg/dL (ref 8.4–10.5)
GFR: 74.25 mL/min (ref >60.00)
GLUCOSE: 124 mg/dL — AB (ref 70–99)
POTASSIUM: 4.2 meq/L (ref 3.5–5.1)
SODIUM: 136 meq/L (ref 135–145)
TOTAL PROTEIN: 7.8 g/dL (ref 6.0–8.3)
Total Bilirubin: 0.4 mg/dL (ref 0.2–1.2)

## 2016-01-13 LAB — MICROALBUMIN / CREATININE URINE RATIO
Creatinine,U: 39.7 mg/dL
MICROALB UR: 0.8 mg/dL (ref 0.0–1.9)
Microalb Creat Ratio: 2 mg/g (ref 0.0–30.0)

## 2016-01-13 MED ORDER — ALPRAZOLAM 0.25 MG PO TABS: 0.2500 mg | ORAL_TABLET | Freq: Three times a day (TID) | ORAL | Status: DC | PRN

## 2016-01-13 NOTE — Patient Instructions (Signed)
Labs today.  Follow up 3 months. 

## 2016-01-13 NOTE — Assessment & Plan Note (Signed)
Will check lipids and LFTs with labs today. Continue Rosuvastatin. 

## 2016-01-13 NOTE — Assessment & Plan Note (Signed)
BG well controlled by report. A1c with labs. Continue Metformin. 

## 2016-01-13 NOTE — Assessment & Plan Note (Signed)
BP Readings from Last 3 Encounters:  01/13/16 132/93  10/26/15 143/84  10/07/15 150/76   BP generally well controlled. Will continue Metformin

## 2016-01-13 NOTE — Progress Notes (Signed)
Subjective:    Patient ID: Jessica Houston, female    DOB: [birth year], 50 y.o.   MRN: [id number]  HPI  50YO female presents for follow up.  DM - BG well controlled. Compliant with medication.  Feeling well.  Following a healthy diet and has lost 6lbs.    Wt Readings from Last 3 Encounters:  01/13/16 183 lb 8 oz (83.235 kg)  10/26/15 189 lb (85.73 kg)  10/07/15 186 lb (84.369 kg)   BP Readings from Last 3 Encounters:  01/13/16 132/93  10/26/15 143/84  10/07/15 150/76    Past Medical History  Diagnosis Date  . Diabetes mellitus     HgbA1c 6.8%, diet controlled. Optho 2010, urine microalbumin 10/2010  . S/P endoscopy     normal per pt. done at [facility] clinic  . Asthma   . Sleep apnea     On CPAP  . Hyperlipidemia   . Hypertension   . Esophageal reflux     CONTROLLED ON CURRENT MEDICATION  . Vitamin D deficiency   . Bronchitis   . Anxiety   . Family history of adverse reaction to anesthesia     dad nausea and vomiting  . PONV (postoperative nausea and vomiting)     with C-section  . Cancer ([place])     skin   Family History  Problem Relation Age of Onset  . Breast cancer Neg Hx    Past Surgical History  Procedure Laterality Date  . Cesarean section N/A   . Hemorrhoid surgery N/A 06/21/2015    Procedure: Internal and external hemorrhoidectomy;  Surgeon: Leonie Green, MD;  Location: [facility] ORS;  Service: General;  Laterality: N/A;   Social History   Social History  . Marital Status: Married    Spouse Name: N/A  . Number of Children: N/A  . Years of Education: N/A   Social History Main Topics  . Smoking status: Never Smoker   . Smokeless tobacco: Never Used  . Alcohol Use: 0.0 - 0.6 oz/week    0-1 Cans of beer per week     Comment: rarely  . Drug Use: No  . Sexual Activity: Yes    Birth Control/ Protection: Surgical     Comment: BTL   Other Topics Concern  . None   Social History Narrative    Review of Systems  Constitutional:  Negative for fever, chills, appetite change, fatigue and unexpected weight change.  Eyes: Negative for visual disturbance.  Respiratory: Negative for shortness of breath.   Cardiovascular: Negative for chest pain and leg swelling.  Gastrointestinal: Negative for nausea, vomiting, abdominal pain, diarrhea and constipation.  Skin: Negative for color change and rash.  Hematological: Negative for adenopathy. Does not bruise/bleed easily.  Psychiatric/Behavioral: Negative for sleep disturbance and dysphoric mood. The patient is not nervous/anxious.        Objective:    BP 132/93 mmHg  Pulse 81  Temp(Src) 97.5 F (36.4 C) (Oral)  Ht 5\' 2"  (1.575 m)  Wt 183 lb 8 oz (83.235 kg)  BMI 33.55 kg/m2  SpO2 98%  LMP 01/08/2016 Physical Exam  Constitutional: She is oriented to person, place, and time. She appears well-developed and well-nourished. No distress.  HENT:  Head: Normocephalic and atraumatic.  Right Ear: External ear normal.  Left Ear: External ear normal.  Nose: Nose normal.  Mouth/Throat: Oropharynx is clear and moist. No oropharyngeal exudate.  Eyes: Conjunctivae are normal. Pupils are equal, round, and reactive to light. Right eye exhibits no discharge.  Left eye exhibits no discharge. No scleral icterus.  Neck: Normal range of motion. Neck supple. No tracheal deviation present. No thyromegaly present.  Cardiovascular: Normal rate, regular rhythm, normal heart sounds and intact distal pulses.  Exam reveals no gallop and no friction rub.   No murmur heard. Pulmonary/Chest: Effort normal and breath sounds normal. No respiratory distress. She has no wheezes. She has no rales. She exhibits no tenderness.  Musculoskeletal: Normal range of motion. She exhibits no edema or tenderness.  Lymphadenopathy:    She has no cervical adenopathy.  Neurological: She is alert and oriented to person, place, and time. No cranial nerve deficit. She exhibits normal muscle tone. Coordination normal.    Skin: Skin is warm and dry. No rash noted. She is not diaphoretic. No erythema. No pallor.  Psychiatric: She has a normal mood and affect. Her behavior is normal. Judgment and thought content normal.          Assessment & Plan:   Problem List Items Addressed This Visit      Unprioritized   Diabetes mellitus type 2, controlled ([place]) - Primary    BG well controlled by report. A1c with labs Continue Metformin.      Relevant Orders   Comprehensive metabolic panel   Hemoglobin A1c   Lipid panel   Microalbumin / creatinine urine ratio   POCT Urinalysis Dipstick   Hyperlipidemia    Will check lipids and LFTs with labs today. Continue Rosuvastatin.      Hypertension    BP Readings from Last 3 Encounters:  01/13/16 132/93  10/26/15 143/84  10/07/15 150/76   BP generally well controlled. Will continue Metformin          Return in about 3 months (around 04/11/2016) for Recheck of Diabetes.  Ronette Deter, MD Internal Medicine [employer] [facility]

## 2016-01-13 NOTE — Progress Notes (Signed)
Pre visit review using our clinic review tool, if applicable. No additional management support is needed unless otherwise documented below in the visit note. 

## 2016-01-18 ENCOUNTER — Other Ambulatory Visit: Payer: Self-pay | Admitting: Internal Medicine

## 2016-03-09 ENCOUNTER — Other Ambulatory Visit: Payer: Self-pay | Admitting: Internal Medicine

## 2016-03-26 ENCOUNTER — Encounter: Payer: Self-pay | Admitting: Internal Medicine

## 2016-03-27 ENCOUNTER — Telehealth: Payer: Self-pay | Admitting: Internal Medicine

## 2016-03-27 NOTE — Telephone Encounter (Signed)
Pt was wondering if the cpap letter has been fax out. She saw in her Mychart that letter was written.

## 2016-03-27 NOTE — Telephone Encounter (Signed)
Yes I it was faxed. Notified the patient.

## 2016-04-13 ENCOUNTER — Ambulatory Visit: Payer: BC Managed Care – PPO | Admitting: Internal Medicine

## 2016-04-20 ENCOUNTER — Encounter: Payer: Self-pay | Admitting: Internal Medicine

## 2016-04-20 ENCOUNTER — Ambulatory Visit (INDEPENDENT_AMBULATORY_CARE_PROVIDER_SITE_OTHER): Payer: BC Managed Care – PPO | Admitting: Internal Medicine

## 2016-04-20 VITALS — BP 120/80 | HR 81 | Ht 61.5 in | Wt 188.2 lb

## 2016-04-20 DIAGNOSIS — I10 Essential (primary) hypertension: Secondary | ICD-10-CM

## 2016-04-20 DIAGNOSIS — M542 Cervicalgia: Secondary | ICD-10-CM | POA: Diagnosis not present

## 2016-04-20 DIAGNOSIS — R350 Frequency of micturition: Secondary | ICD-10-CM

## 2016-04-20 DIAGNOSIS — E785 Hyperlipidemia, unspecified: Secondary | ICD-10-CM | POA: Diagnosis not present

## 2016-04-20 DIAGNOSIS — E119 Type 2 diabetes mellitus without complications: Secondary | ICD-10-CM

## 2016-04-20 LAB — COMPREHENSIVE METABOLIC PANEL
ALT: 25 U/L (ref 0–35)
AST: 21 U/L (ref 0–37)
Albumin: 4.3 g/dL (ref 3.5–5.2)
Alkaline Phosphatase: 65 U/L (ref 39–117)
BUN: 17 mg/dL (ref 6–23)
CHLORIDE: 97 meq/L (ref 96–112)
CO2: 27 meq/L (ref 19–32)
Calcium: 9.7 mg/dL (ref 8.4–10.5)
Creatinine, Ser: 0.78 mg/dL (ref 0.40–1.20)
GFR: 83.01 mL/min (ref >60.00)
GLUCOSE: 132 mg/dL — AB (ref 70–99)
POTASSIUM: 4.1 meq/L (ref 3.5–5.1)
Sodium: 134 mEq/L — ABNORMAL LOW (ref 135–145)
Total Bilirubin: 0.4 mg/dL (ref 0.2–1.2)
Total Protein: 7.3 g/dL (ref 6.0–8.3)

## 2016-04-20 LAB — LIPID PANEL
CHOL/HDL RATIO: 5
Cholesterol: 210 mg/dL — ABNORMAL HIGH (ref 0–200)
HDL: 39.2 mg/dL (ref >39.00)
NONHDL: 170.94
TRIGLYCERIDES: 211 mg/dL — AB (ref 0.0–149.0)
VLDL: 42.2 mg/dL — AB (ref 0.0–40.0)

## 2016-04-20 LAB — MICROALBUMIN / CREATININE URINE RATIO
Creatinine,U: 18.1 mg/dL
Microalb Creat Ratio: 3.9 mg/g (ref 0.0–30.0)
Microalb, Ur: <0.7 mg/dL (ref 0.0–1.9)

## 2016-04-20 LAB — HEMOGLOBIN A1C: Hgb A1c MFr Bld: 7.4 % — ABNORMAL HIGH (ref 4.6–6.5)

## 2016-04-20 LAB — LDL CHOLESTEROL, DIRECT: Direct LDL: 132 mg/dL

## 2016-04-20 NOTE — Progress Notes (Signed)
Subjective:    Patient ID: Jessica Houston, female    DOB: [birth year], 50 y.o.   MRN: [id number]  HPI  50YO female presents for follow up.  DM - Not checking regularly. Near 150 mostly. Compliant with medication.  Notes some urinary frequency over last few days. No burning with urination. No fever chills flank pain.  Lab Results  Component Value Date   HGBA1C 7.1* 01/13/2016   Recently had some nasal congestion and cough. Taking allergy medication with some improvement. No fever chills.  Neck pain - Right sided aching pain. Does not generally radiate. Questions if this was from playing [employer] the other day. Did not take anything for this.  Wt Readings from Last 3 Encounters:  04/20/16 188 lb 3.2 oz (85.367 kg)  01/13/16 183 lb 8 oz (83.235 kg)  10/26/15 189 lb (85.73 kg)   BP Readings from Last 3 Encounters:  04/20/16 120/80  01/13/16 132/93  10/26/15 143/84    Past Medical History  Diagnosis Date  . Diabetes mellitus     HgbA1c 6.8%, diet controlled. Optho 2010, urine microalbumin 10/2010  . S/P endoscopy     normal per pt. done at [facility] clinic  . Asthma   . Sleep apnea     On CPAP  . Hyperlipidemia   . Hypertension   . Esophageal reflux     CONTROLLED ON CURRENT MEDICATION  . Vitamin D deficiency   . Bronchitis   . Anxiety   . Family history of adverse reaction to anesthesia     dad nausea and vomiting  . PONV (postoperative nausea and vomiting)     with C-section  . Cancer ([place])     skin   Family History  Problem Relation Age of Onset  . Breast cancer Neg Hx    Past Surgical History  Procedure Laterality Date  . Cesarean section N/A   . Hemorrhoid surgery N/A 06/21/2015    Procedure: Internal and external hemorrhoidectomy;  Surgeon: Leonie Green, MD;  Location: [facility] ORS;  Service: General;  Laterality: N/A;   Social History   Social History  . Marital Status: Married    Spouse Name: N/A  . Number of Children: N/A  . Years of  Education: N/A   Social History Main Topics  . Smoking status: Never Smoker   . Smokeless tobacco: Never Used  . Alcohol Use: 0.0 - 0.6 oz/week    0-1 Cans of beer per week     Comment: rarely  . Drug Use: No  . Sexual Activity: Yes    Birth Control/ Protection: Surgical     Comment: BTL   Other Topics Concern  . None   Social History Narrative    Review of Systems  Constitutional: Negative for fever, chills, appetite change, fatigue and unexpected weight change.  Eyes: Negative for visual disturbance.  Respiratory: Negative for cough and shortness of breath.   Cardiovascular: Negative for chest pain and leg swelling.  Gastrointestinal: Negative for nausea, vomiting, abdominal pain, diarrhea and constipation.  Musculoskeletal: Positive for myalgias, back pain, arthralgias and neck pain.  Skin: Negative for color change and rash.  Hematological: Negative for adenopathy. Does not bruise/bleed easily.  Psychiatric/Behavioral: Positive for sleep disturbance. Negative for dysphoric mood. The patient is not nervous/anxious.        Objective:    BP 120/80 mmHg  Pulse 81  Ht 5' 1.5" (1.562 m)  Wt 188 lb 3.2 oz (85.367 kg)  BMI 34.99 kg/m2  SpO2  98%  LMP 03/05/2016 (LMP Unknown) Physical Exam  Constitutional: She is oriented to person, place, and time. She appears well-developed and well-nourished. No distress.  HENT:  Head: Normocephalic and atraumatic.  Right Ear: External ear normal.  Left Ear: External ear normal.  Nose: Nose normal.  Mouth/Throat: Oropharynx is clear and moist. No oropharyngeal exudate.  Eyes: Conjunctivae are normal. Pupils are equal, round, and reactive to light. Right eye exhibits no discharge. Left eye exhibits no discharge. No scleral icterus.  Neck: Normal range of motion. Neck supple. No tracheal deviation present. No thyromegaly present.  Cardiovascular: Normal rate, regular rhythm, normal heart sounds and intact distal pulses.  Exam reveals no  gallop and no friction rub.   No murmur heard. Pulmonary/Chest: Effort normal and breath sounds normal. No respiratory distress. She has no wheezes. She has no rales. She exhibits no tenderness.  Musculoskeletal: Normal range of motion. She exhibits no edema.       Cervical back: She exhibits tenderness, pain and spasm. She exhibits normal range of motion and no bony tenderness.       Back:  Lymphadenopathy:    She has no cervical adenopathy.  Neurological: She is alert and oriented to person, place, and time. No cranial nerve deficit. She exhibits normal muscle tone. Coordination normal.  Skin: Skin is warm and dry. No rash noted. She is not diaphoretic. No erythema. No pallor.  Psychiatric: She has a normal mood and affect. Her behavior is normal. Judgment and thought content normal.          Assessment & Plan:   Problem List Items Addressed This Visit      Unprioritized   Diabetes mellitus type 2, controlled ([place]) - Primary    Will check A1c with labs. Continue Metformin.      Relevant Orders   Comprehensive metabolic panel   Hemoglobin A1c   Lipid panel   Microalbumin / creatinine urine ratio   Hyperlipidemia    Will check lipids and LFTs with labs. Continue Crestor.      Hypertension    BP Readings from Last 3 Encounters:  04/20/16 120/80  01/13/16 132/93  10/26/15 143/84   BP well controlled. Renal function with labs. Continue current medications.      Neck pain    Right sided neck pain, likely spasm of the trapezius. Will continue Meloxicam, ice. Discussed adding Flexeril if symptoms persistent.       Other Visit Diagnoses    Urinary frequency        Relevant Orders    POCT Urinalysis Dipstick    CULTURE, URINE COMPREHENSIVE        Return in about 3 months (around 07/21/2016) for Recheck of Diabetes.  Ronette Deter, MD Internal Medicine [facility]

## 2016-04-20 NOTE — Assessment & Plan Note (Signed)
BP Readings from Last 3 Encounters:  04/20/16 120/80  01/13/16 132/93  10/26/15 143/84   BP well controlled. Renal function with labs. Continue current medications.

## 2016-04-20 NOTE — Assessment & Plan Note (Signed)
Will check lipids and LFTs with labs. Continue Crestor.

## 2016-04-20 NOTE — Assessment & Plan Note (Signed)
Right sided neck pain, likely spasm of the trapezius. Will continue Meloxicam, ice. Discussed adding Flexeril if symptoms persistent.

## 2016-04-20 NOTE — Assessment & Plan Note (Signed)
Will check A1c with labs. Continue Metformin. 

## 2016-04-20 NOTE — Patient Instructions (Addendum)
Call or email if neck pain is persistent. We may consider trying a muscle relaxer.  Labs today.

## 2016-04-22 ENCOUNTER — Other Ambulatory Visit: Payer: Self-pay | Admitting: Internal Medicine

## 2016-04-24 LAB — CULTURE, URINE COMPREHENSIVE

## 2016-04-25 ENCOUNTER — Telehealth: Payer: Self-pay | Admitting: *Deleted

## 2016-04-25 MED ORDER — SULFAMETHOXAZOLE-TRIMETHOPRIM 800-160 MG PO TABS: 1.0000 | ORAL_TABLET | Freq: Two times a day (BID) | ORAL | Status: DC

## 2016-04-25 MED ORDER — NEBIVOLOL HCL 5 MG PO TABS: ORAL_TABLET | ORAL | Status: DC

## 2016-04-25 NOTE — Telephone Encounter (Signed)
Fine to fill 90 days of Bystolic

## 2016-04-25 NOTE — Telephone Encounter (Signed)
Patient requested her lab results from 06/02 [phone]

## 2016-04-25 NOTE — Telephone Encounter (Signed)
Refill sent.

## 2016-04-25 NOTE — Telephone Encounter (Signed)
Patient is aware of her lab result.  Rx for Bactrim DS was sent.  Patient would like to know if she can have a 90 day supply of bystoic because it is the same cost as a 30 day refill?

## 2016-05-14 ENCOUNTER — Other Ambulatory Visit: Payer: Self-pay | Admitting: Internal Medicine

## 2016-05-14 ENCOUNTER — Encounter: Payer: Self-pay | Admitting: Internal Medicine

## 2016-05-14 DIAGNOSIS — M25551 Pain in right hip: Secondary | ICD-10-CM

## 2016-05-14 MED ORDER — FERROUS SULFATE 325 (65 FE) MG PO TABS: ORAL_TABLET | ORAL | Status: DC

## 2016-05-14 MED ORDER — CLOTRIMAZOLE-BETAMETHASONE 1-0.05 % EX CREA: TOPICAL_CREAM | CUTANEOUS | Status: AC

## 2016-05-14 MED ORDER — NEBIVOLOL HCL 5 MG PO TABS: ORAL_TABLET | ORAL | Status: DC

## 2016-05-14 MED ORDER — HYDROCHLOROTHIAZIDE 12.5 MG PO CAPS: ORAL_CAPSULE | ORAL | Status: DC

## 2016-05-14 MED ORDER — METFORMIN HCL 500 MG PO TABS: ORAL_TABLET | ORAL | Status: DC

## 2016-05-14 MED ORDER — PANTOPRAZOLE SODIUM 40 MG PO TBEC: DELAYED_RELEASE_TABLET | ORAL | Status: DC

## 2016-05-14 MED ORDER — FLUTICASONE PROPIONATE HFA 110 MCG/ACT IN AERO: 1.0000 | INHALATION_SPRAY | Freq: Two times a day (BID) | RESPIRATORY_TRACT | Status: DC

## 2016-05-14 MED ORDER — ONETOUCH ULTRASOFT LANCETS MISC: 1.0000 | Freq: Two times a day (BID) | Status: AC

## 2016-05-14 MED ORDER — MELOXICAM 15 MG PO TABS: 15.0000 mg | ORAL_TABLET | Freq: Every day | ORAL | Status: DC

## 2016-05-14 MED ORDER — FLUTICASONE PROPIONATE 50 MCG/ACT NA SUSP: NASAL | Status: DC

## 2016-05-14 MED ORDER — ALBUTEROL SULFATE HFA 108 (90 BASE) MCG/ACT IN AERS: 2.0000 | INHALATION_SPRAY | Freq: Four times a day (QID) | RESPIRATORY_TRACT | Status: DC | PRN

## 2016-05-14 MED ORDER — ALPRAZOLAM 0.25 MG PO TABS: 0.2500 mg | ORAL_TABLET | Freq: Three times a day (TID) | ORAL | Status: DC | PRN

## 2016-05-14 MED ORDER — ROSUVASTATIN CALCIUM 5 MG PO TABS: 5.0000 mg | ORAL_TABLET | Freq: Every day | ORAL | Status: DC

## 2016-05-14 MED ORDER — GLUCOSE BLOOD VI STRP: ORAL_STRIP | Status: DC

## 2016-05-14 NOTE — Telephone Encounter (Signed)
Please advise these medication?

## 2016-05-18 ENCOUNTER — Telehealth: Payer: Self-pay | Admitting: *Deleted

## 2016-05-18 NOTE — Telephone Encounter (Signed)
Patient has emails with Dr.Walker, she wanted to know if Dr. Gilford Rile signed off on the Rx Please call patient when Rx is ready for pick up

## 2016-05-18 NOTE — Telephone Encounter (Signed)
Returned call.  No answer.  Left a message to call the office back.

## 2016-05-18 NOTE — Telephone Encounter (Signed)
Notified patient Rx was sent to pharmacy 05/14/16. Confirmed with pharmacy Rx received.

## 2016-05-18 NOTE — Telephone Encounter (Signed)
Patient has requested a follow up on her Xanax Rx

## 2016-07-19 ENCOUNTER — Ambulatory Visit: Payer: BC Managed Care – PPO | Admitting: Family

## 2016-07-20 ENCOUNTER — Ambulatory Visit: Payer: BC Managed Care – PPO | Admitting: Internal Medicine

## 2016-07-25 ENCOUNTER — Ambulatory Visit (INDEPENDENT_AMBULATORY_CARE_PROVIDER_SITE_OTHER): Payer: BC Managed Care – PPO | Admitting: Family

## 2016-07-25 ENCOUNTER — Other Ambulatory Visit: Payer: Self-pay | Admitting: Family

## 2016-07-25 ENCOUNTER — Encounter: Payer: Self-pay | Admitting: Family

## 2016-07-25 VITALS — BP 138/72 | HR 83 | Temp 98.0°F | Resp 16 | Ht 61.0 in | Wt 188.8 lb

## 2016-07-25 DIAGNOSIS — F411 Generalized anxiety disorder: Secondary | ICD-10-CM | POA: Diagnosis not present

## 2016-07-25 DIAGNOSIS — E119 Type 2 diabetes mellitus without complications: Secondary | ICD-10-CM | POA: Diagnosis not present

## 2016-07-25 DIAGNOSIS — Z1239 Encounter for other screening for malignant neoplasm of breast: Secondary | ICD-10-CM

## 2016-07-25 DIAGNOSIS — E785 Hyperlipidemia, unspecified: Secondary | ICD-10-CM | POA: Diagnosis not present

## 2016-07-25 DIAGNOSIS — I10 Essential (primary) hypertension: Secondary | ICD-10-CM

## 2016-07-25 DIAGNOSIS — F419 Anxiety disorder, unspecified: Secondary | ICD-10-CM | POA: Diagnosis not present

## 2016-07-25 LAB — COMPREHENSIVE METABOLIC PANEL
ALT: 34 U/L (ref 0–35)
AST: 28 U/L (ref 0–37)
Albumin: 4 g/dL (ref 3.5–5.2)
Alkaline Phosphatase: 75 U/L (ref 39–117)
BUN: 15 mg/dL (ref 6–23)
CALCIUM: 9.4 mg/dL (ref 8.4–10.5)
CHLORIDE: 97 meq/L (ref 96–112)
CO2: 29 meq/L (ref 19–32)
CREATININE: 0.72 mg/dL (ref 0.40–1.20)
GFR: 90.95 mL/min (ref >60.00)
Glucose, Bld: 143 mg/dL — ABNORMAL HIGH (ref 70–99)
Potassium: 3.9 mEq/L (ref 3.5–5.1)
Sodium: 136 mEq/L (ref 135–145)
Total Bilirubin: 0.3 mg/dL (ref 0.2–1.2)
Total Protein: 7.4 g/dL (ref 6.0–8.3)

## 2016-07-25 LAB — LIPID PANEL
CHOL/HDL RATIO: 5
Cholesterol: 193 mg/dL (ref 0–200)
HDL: 38.8 mg/dL — AB (ref >39.00)
NONHDL: 154.56
Triglycerides: 270 mg/dL — ABNORMAL HIGH (ref 0.0–149.0)
VLDL: 54 mg/dL — AB (ref 0.0–40.0)

## 2016-07-25 LAB — LDL CHOLESTEROL, DIRECT: Direct LDL: 118 mg/dL

## 2016-07-25 LAB — HEMOGLOBIN A1C: Hgb A1c MFr Bld: 7.6 % — ABNORMAL HIGH (ref 4.6–6.5)

## 2016-07-25 MED ORDER — BUSPIRONE HCL 7.5 MG PO TABS: 7.5000 mg | ORAL_TABLET | Freq: Two times a day (BID) | ORAL | 1 refills | Status: AC | PRN

## 2016-07-25 NOTE — Assessment & Plan Note (Signed)
Stable. Patient only using Xanax once or twice a week. We jointly agreed to trial BuSpar which may offer a safer option while still maintaining good control of anxiety on PRN basis.

## 2016-07-25 NOTE — Assessment & Plan Note (Signed)
Pending A1c 

## 2016-07-25 NOTE — Assessment & Plan Note (Signed)
Controlled. Pending CMP. Continue current regimen.

## 2016-07-25 NOTE — Assessment & Plan Note (Signed)
Pending lipid panel today. Patient taking Crestor approximately 2 times a week. Encouraged diet low in trans-fat and low in saturated fat.

## 2016-07-25 NOTE — Patient Instructions (Addendum)
Pleasure meeting you today.   Trial of Buspar. Please stop Xanax.   Let me know how you are doing :)   Basic Carbohydrate Counting for Diabetes Mellitus Carbohydrate counting is a method for keeping track of the amount of carbohydrates you eat. Eating carbohydrates naturally increases the level of sugar (glucose) in your blood, so it is important for you to know the amount that is okay for you to have in every meal. Carbohydrate counting helps keep the level of glucose in your blood within normal limits. The amount of carbohydrates allowed is different for every person. A dietitian can help you calculate the amount that is right for you. Once you know the amount of carbohydrates you can have, you can count the carbohydrates in the foods you want to eat. Carbohydrates are found in the following foods:  Grains, such as breads and cereals.  Dried beans and soy products.  Starchy vegetables, such as potatoes, peas, and corn.  Fruit and fruit juices.  Milk and yogurt.  Sweets and snack foods, such as cake, cookies, candy, chips, soft drinks, and fruit drinks. CARBOHYDRATE COUNTING There are two ways to count the carbohydrates in your food. You can use either of the methods or a combination of both. Reading the "Nutrition Facts" on [place] The "Nutrition Facts" is an area that is included on the labels of almost all packaged food and beverages in the [place]. It includes the serving size of that food or beverage and information about the nutrients in each serving of the food, including the grams (g) of carbohydrate per serving.  Decide the number of servings of this food or beverage that you will be able to eat or drink. Multiply that number of servings by the number of grams of carbohydrate that is listed on the label for that serving. The total will be the amount of carbohydrates you will be having when you eat or drink this food or beverage. Learning Standard Serving Sizes of  Food When you eat food that is not packaged or does not include "Nutrition Facts" on the label, you need to measure the servings in order to count the amount of carbohydrates.A serving of most carbohydrate-rich foods contains about 15 g of carbohydrates. The following list includes serving sizes of carbohydrate-rich foods that provide 15 g ofcarbohydrate per serving:   1 slice of bread (1 oz) or 1 six-inch tortilla.    of a hamburger bun or English muffin.  4-6 crackers.   cup unsweetened dry cereal.    cup hot cereal.   cup rice or pasta.    cup mashed potatoes or  of a large baked potato.  1 cup fresh fruit or one small piece of fruit.    cup canned or frozen fruit or fruit juice.  1 cup milk.   cup plain fat-free yogurt or yogurt sweetened with artificial sweeteners.   cup cooked dried beans or starchy vegetable, such as peas, corn, or potatoes.  Decide the number of standard-size servings that you will eat. Multiply that number of servings by 15 (the grams of carbohydrates in that serving). For example, if you eat 2 cups of strawberries, you will have eaten 2 servings and 30 g of carbohydrates (2 servings x 15 g = 30 g). For foods such as soups and casseroles, in which more than one food is mixed in, you will need to count the carbohydrates in each food that is included. EXAMPLE OF CARBOHYDRATE COUNTING Sample Dinner  3  oz chicken breast.   cup of brown rice.   cup of corn.  1 cup milk.   1 cup strawberries with sugar-free whipped topping.  Carbohydrate Calculation Step 1: Identify the foods that contain carbohydrates:   Rice.   Corn.   Milk.   Strawberries. Step 2:Calculate the number of servings eaten of each:   2 servings of rice.   1 serving of corn.   1 serving of milk.   1 serving of strawberries. Step 3: Multiply each of those number of servings by 15 g:   2 servings of rice x 15 g = 30 g.   1 serving of corn x 15 g  = 15 g.   1 serving of milk x 15 g = 15 g.   1 serving of strawberries x 15 g = 15 g. Step 4: Add together all of the amounts to find the total grams of carbohydrates eaten: 30 g + 15 g + 15 g + 15 g = 75 g.   This information is not intended to replace advice given to you by your health care provider. Make sure you discuss any questions you have with your health care provider.   Document Released: 11/05/2005 Document Revised: 11/26/2014 Document Reviewed: 10/02/2013 Elsevier Interactive Patient Education [employer].

## 2016-07-25 NOTE — Progress Notes (Signed)
Subjective:    Patient ID: Jessica Houston, female    DOB: 03-31-1966, 50 y.o.   MRN: [id number]  CC: Jessica Houston is a 50 y.o. female who presents today for follow up.   HPI: Patient here for follow-up on diabetes. Generally feeling well. Last A1c 7.4. She is on metformin. BS at home running 120-140.   HLD- On crestor twice a week as had muscle aches.   Anxiety- Prescribed xanax 0.25 TID however only takes at night averages twice per week. Helps her fall asleep as anxiety will keep her up. Occasionally needs during the day time.    Declines flu shot today.    HISTORY:  Past Medical History:  Diagnosis Date  . Anxiety   . Asthma   . Bronchitis   . Cancer ([place])    skin  . Diabetes mellitus    HgbA1c 6.8%, diet controlled. Optho 2010, urine microalbumin 10/2010  . Esophageal reflux    CONTROLLED ON CURRENT MEDICATION  . Family history of adverse reaction to anesthesia    dad nausea and vomiting  . Hyperlipidemia   . Hypertension   . PONV (postoperative nausea and vomiting)    with C-section  . S/P endoscopy    normal per pt. done at [facility] clinic  . Sleep apnea    On CPAP  . Vitamin D deficiency    Past Surgical History:  Procedure Laterality Date  . CESAREAN SECTION N/A   . HEMORRHOID SURGERY N/A 06/21/2015   Procedure: Internal and external hemorrhoidectomy;  Surgeon: Leonie Green, MD;  Location: [facility] ORS;  Service: General;  Laterality: N/A;   Family History  Problem Relation Age of Onset  . Hyperlipidemia Mother   . Diabetes Father   . Heart disease Maternal Grandmother   . Diabetes Maternal Grandfather   . Stroke Paternal Grandmother   . Diabetes Paternal Grandfather   . Breast cancer Neg Hx     Allergies: Ace inhibitors; Ciprofloxacin; Codeine; and Onglyza [saxagliptin] Current Outpatient Prescriptions on File Prior to Visit  Medication Sig Dispense Refill  . Calcium Carbonate Antacid (TUMS PO) Take 1 tablet by mouth as needed.      .  clotrimazole-betamethasone (LOTRISONE) cream apply to affected area(s) twice daily 30 g 0  . fluticasone (FLONASE) 50 MCG/ACT nasal spray Use 2 sprays in each nostril once daily as needed 16 g 5  . fluticasone (FLOVENT HFA) 110 MCG/ACT inhaler Inhale 1 puff into the lungs 2 (two) times daily. 12 g 11  . glucose blood test strip Use as instructed with One Touch Delica Glucometer.  Dx: E11.9 100 each 4  . hydrochlorothiazide (MICROZIDE) 12.5 MG capsule TAKE 1 CAPSULE(12.5 MG) BY MOUTH DAILY 30 capsule 9  . Lancets (ONETOUCH ULTRASOFT) lancets 1 each by Other route 2 (two) times daily. Dispense one touch nano lancets 150 each 4  . metFORMIN (GLUCOPHAGE) 500 MG tablet TAKE 2 TABLETS BY MOUTH TWICE DAILY WITH A MEAL 360 tablet 3  . nebivolol (BYSTOLIC) 5 MG tablet TAKE 1 TABLET(5 MG) BY MOUTH DAILY 90 tablet 3  . pantoprazole (PROTONIX) 40 MG tablet TAKE 1 TABLET(40 MG) BY MOUTH DAILY 30 tablet 9  . UNABLE TO FIND One Touch Ultra Mini Test strips for patient to use with home machine. Patient is testing blood sugars at home 2 times daily. Dx code 250.00 100 each 6  . albuterol (PROVENTIL HFA;VENTOLIN HFA) 108 (90 Base) MCG/ACT inhaler Inhale 2 puffs into the lungs every 6 (six) hours  as needed for wheezing. (Patient not taking: Reported on 07/25/2016) 1 Inhaler 6  . Multiple Vitamin (MULTIVITAMIN) capsule Take 1 capsule by mouth daily.      . rosuvastatin (CRESTOR) 5 MG tablet Take 1 tablet (5 mg total) by mouth daily. (Patient not taking: Reported on 07/25/2016) 90 tablet 3  . Vitamin D, Ergocalciferol, (DRISDOL) 50000 units CAPS capsule TAKE 1 CAPSULE BY MOUTH EVERY WEEK. 12 capsule 3   No current facility-administered medications on file prior to visit.     Social History  Substance Use Topics  . Smoking status: Never Smoker  . Smokeless tobacco: Never Used  . Alcohol use 0.0 - 0.6 oz/week     Comment: rarely    Review of Systems  Constitutional: Negative for chills and fever.  Respiratory:  Negative for cough.   Cardiovascular: Negative for chest pain and palpitations.  Gastrointestinal: Negative for nausea and vomiting.  Endocrine: Negative for polyuria.  Neurological: Negative for dizziness.  Psychiatric/Behavioral: The patient is nervous/anxious.       Objective:    BP 138/72 (BP Location: Left Arm, Patient Position: Sitting, Cuff Size: Normal)   Pulse 83   Temp 98 F (36.7 C) (Oral)   Resp 16   Ht 5\' 1"  (1.549 m)   Wt 188 lb 12.8 oz (85.6 kg)   LMP 07/18/2016   SpO2 96%   BMI 35.67 kg/m  BP Readings from Last 3 Encounters:  07/25/16 138/72  04/20/16 120/80  01/13/16 (!) 132/93   Wt Readings from Last 3 Encounters:  07/25/16 188 lb 12.8 oz (85.6 kg)  04/20/16 188 lb 3.2 oz (85.4 kg)  01/13/16 183 lb 8 oz (83.2 kg)    Physical Exam  Constitutional: She appears well-developed and well-nourished.  Eyes: Conjunctivae are normal.  Cardiovascular: Normal rate, regular rhythm, normal heart sounds and normal pulses.   Pulmonary/Chest: Effort normal and breath sounds normal. She has no wheezes. She has no rhonchi. She has no rales.  Neurological: She is alert.  Skin: Skin is warm and dry.  Psychiatric: She has a normal mood and affect. Her speech is normal and behavior is normal. Thought content normal.  Vitals reviewed.      Assessment & Plan:   Problem List Items Addressed This Visit      Cardiovascular and Mediastinum   Hypertension    Controlled. Pending CMP. Continue current regimen.        Endocrine   Diabetes mellitus type 2, controlled ([place])    Pending A1c.         Other   Anxiety    Stable. Patient only using Xanax once or twice a week. We jointly agreed to trial BuSpar which may offer a safer option while still maintaining good control of anxiety on PRN basis.      Relevant Medications   busPIRone (BUSPAR) 7.5 MG tablet   Hyperlipidemia    Pending lipid panel today. Patient taking Crestor approximately 2 times a week. Encouraged  diet low in trans-fat and low in saturated fat.      Relevant Orders   Lipid panel    Other Visit Diagnoses    Anxiety state    -  Primary   Relevant Medications   busPIRone (BUSPAR) 7.5 MG tablet   Type 2 diabetes mellitus without complication, without long-term current use of insulin (HCC)       Relevant Orders   Hemoglobin A1c   Comprehensive metabolic panel       I have discontinued  Ms. Fiorello's oxyCODONE-acetaminophen, sulfamethoxazole-trimethoprim, ALPRAZolam, ferrous sulfate, and meloxicam. I am also having her start on busPIRone. Additionally, I am having her maintain her Calcium Carbonate Antacid (TUMS PO), multivitamin, UNABLE TO FIND, Vitamin D (Ergocalciferol), albuterol, clotrimazole-betamethasone, fluticasone, fluticasone, glucose blood, hydrochlorothiazide, onetouch ultrasoft, metFORMIN, nebivolol, pantoprazole, and rosuvastatin.   Meds ordered this encounter  Medications  . busPIRone (BUSPAR) 7.5 MG tablet    Sig: Take 1 tablet (7.5 mg total) by mouth 2 (two) times daily as needed.    Dispense:  60 tablet    Refill:  1    Order Specific Question:   Supervising Provider    Answer:   Crecencio Mc [[id number]]    Return precautions given.   Risks, benefits, and alternatives of the medications and treatment plan prescribed today were discussed, and patient expressed understanding.   Education regarding symptom management and diagnosis given to patient on AVS.  Continue to follow with Rica Mast, MD for routine health maintenance.   Lenard Lance and I agreed with plan.   Mable Paris, FNP Total of 25 minutes spent with patient, greater than 50% of which was spent in discussion of  Anxiety and BZDs.

## 2016-07-26 ENCOUNTER — Telehealth: Payer: Self-pay | Admitting: *Deleted

## 2016-07-26 ENCOUNTER — Encounter: Payer: Self-pay | Admitting: Family

## 2016-07-26 ENCOUNTER — Other Ambulatory Visit: Payer: Self-pay | Admitting: Family

## 2016-07-26 ENCOUNTER — Telehealth: Payer: Self-pay | Admitting: Family

## 2016-07-26 DIAGNOSIS — E119 Type 2 diabetes mellitus without complications: Secondary | ICD-10-CM

## 2016-07-26 MED ORDER — LIRAGLUTIDE 18 MG/3ML ~~LOC~~ SOPN: PEN_INJECTOR | SUBCUTANEOUS | 12 refills | Status: DC

## 2016-07-26 NOTE — Telephone Encounter (Signed)
Patient has requested lab results  Pt contact [phone]

## 2016-07-26 NOTE — Telephone Encounter (Signed)
Spoke with patient, see result note, thanks

## 2016-08-06 NOTE — Telephone Encounter (Signed)
closed

## 2016-10-25 ENCOUNTER — Ambulatory Visit (INDEPENDENT_AMBULATORY_CARE_PROVIDER_SITE_OTHER): Payer: BC Managed Care – PPO | Admitting: Family

## 2016-10-25 ENCOUNTER — Encounter: Payer: Self-pay | Admitting: Family

## 2016-10-25 VITALS — BP 104/76 | HR 75 | Temp 98.0°F | Ht 61.0 in | Wt 187.0 lb

## 2016-10-25 DIAGNOSIS — R300 Dysuria: Secondary | ICD-10-CM

## 2016-10-25 DIAGNOSIS — E119 Type 2 diabetes mellitus without complications: Secondary | ICD-10-CM | POA: Diagnosis not present

## 2016-10-25 DIAGNOSIS — Z1231 Encounter for screening mammogram for malignant neoplasm of breast: Secondary | ICD-10-CM | POA: Diagnosis not present

## 2016-10-25 DIAGNOSIS — I10 Essential (primary) hypertension: Secondary | ICD-10-CM | POA: Diagnosis not present

## 2016-10-25 DIAGNOSIS — F419 Anxiety disorder, unspecified: Secondary | ICD-10-CM

## 2016-10-25 DIAGNOSIS — Z1239 Encounter for other screening for malignant neoplasm of breast: Secondary | ICD-10-CM

## 2016-10-25 LAB — POCT URINALYSIS DIPSTICK
BILIRUBIN UA: NEGATIVE
GLUCOSE UA: NEGATIVE
Ketones, UA: NEGATIVE
NITRITE UA: NEGATIVE
Protein, UA: NEGATIVE
Spec Grav, UA: 1.015
Urobilinogen, UA: 0.2
pH, UA: 7

## 2016-10-25 LAB — HEMOGLOBIN A1C: Hgb A1c MFr Bld: 6.9 % — ABNORMAL HIGH (ref 4.6–6.5)

## 2016-10-25 NOTE — Assessment & Plan Note (Addendum)
Uncontrolled. Pending a1c. Discussed victoza again. Patient still slightly resistant to injectable. Will see what a1c shows and then discuss again with patient.

## 2016-10-25 NOTE — Assessment & Plan Note (Addendum)
New. UA showed small blood, leukocytes. Patient and I agree jointly to wait on urine culture prior to treatment .

## 2016-10-25 NOTE — Assessment & Plan Note (Signed)
Controlled. On low side. Trial stop HCTZ. F/u 3 months.

## 2016-10-25 NOTE — Patient Instructions (Addendum)
Trial stop hydrochlorothiazide  Monitor BP, goal < 140/90.   A1c.

## 2016-10-25 NOTE — Progress Notes (Signed)
Pre visit review using our clinic review tool, if applicable. No additional management support is needed unless otherwise documented below in the visit note. 

## 2016-10-25 NOTE — Assessment & Plan Note (Signed)
- 

## 2016-10-25 NOTE — Progress Notes (Signed)
Subjective:    Patient ID: Jessica Houston, female    DOB: [birth year], 50 y.o.   MRN: [id number]  CC: Jessica Houston is a 50 y.o. female who presents today for follow up.   HPI: Anxiety- doing well. Sleeping well. Takes Buspar occasionally at night.  DM- on metformin BID. Fasting BS 120 today. No hypoglycemic episodes.  C/o dysuria, suprapubic pressure intermittent x 1. No fever, flank pain, hematuria. No h/o renal stone  HTN- Compliant with medication. would like to trial stop HCTZ which was started for LE swelling;no longer has LE swelling.     HISTORY:  Past Medical History:  Diagnosis Date  . Anxiety   . Asthma   . Bronchitis   . Cancer ([place])    skin  . Diabetes mellitus    HgbA1c 6.8%, diet controlled. Optho 2010, urine microalbumin 10/2010  . Esophageal reflux    CONTROLLED ON CURRENT MEDICATION  . Family history of adverse reaction to anesthesia    dad nausea and vomiting  . Hyperlipidemia   . Hypertension   . PONV (postoperative nausea and vomiting)    with C-section  . S/P endoscopy    normal per pt. done at [facility]  . Sleep apnea    On CPAP  . Vitamin D deficiency    Past Surgical History:  Procedure Laterality Date  . CESAREAN SECTION N/A   . HEMORRHOID SURGERY N/A 06/21/2015   Procedure: Internal and external hemorrhoidectomy;  Surgeon: Leonie Green, MD;  Location: [facility] ORS;  Service: General;  Laterality: N/A;   Family History  Problem Relation Age of Onset  . Hyperlipidemia Mother   . Diabetes Father   . Heart disease Maternal Grandmother   . Diabetes Maternal Grandfather   . Stroke Paternal Grandmother   . Diabetes Paternal Grandfather   . Breast cancer Neg Hx     Allergies: Ace inhibitors; Ciprofloxacin; Codeine; and Onglyza [saxagliptin] Current Outpatient Prescriptions on File Prior to Visit  Medication Sig Dispense Refill  . albuterol (PROVENTIL HFA;VENTOLIN HFA) 108 (90 Base) MCG/ACT inhaler Inhale 2 puffs into the  lungs every 6 (six) hours as needed for wheezing. 1 Inhaler 6  . Calcium Carbonate Antacid (TUMS PO) Take 1 tablet by mouth as needed.      . clotrimazole-betamethasone (LOTRISONE) cream apply to affected area(s) twice daily 30 g 0  . fluticasone (FLONASE) 50 MCG/ACT nasal spray Use 2 sprays in each nostril once daily as needed 16 g 5  . fluticasone (FLOVENT HFA) 110 MCG/ACT inhaler Inhale 1 puff into the lungs 2 (two) times daily. 12 g 11  . glucose blood test strip Use as instructed with One Touch Delica Glucometer.  Dx: E11.9 100 each 4  . Lancets (ONETOUCH ULTRASOFT) lancets 1 each by Other route 2 (two) times daily. Dispense one touch nano lancets 150 each 4  . metFORMIN (GLUCOPHAGE) 500 MG tablet TAKE 2 TABLETS BY MOUTH TWICE DAILY WITH A MEAL 360 tablet 3  . Multiple Vitamin (MULTIVITAMIN) capsule Take 1 capsule by mouth daily.      . nebivolol (BYSTOLIC) 5 MG tablet TAKE 1 TABLET(5 MG) BY MOUTH DAILY 90 tablet 3  . pantoprazole (PROTONIX) 40 MG tablet TAKE 1 TABLET(40 MG) BY MOUTH DAILY 30 tablet 9  . rosuvastatin (CRESTOR) 5 MG tablet Take 1 tablet (5 mg total) by mouth daily. 90 tablet 3  . UNABLE TO FIND One Touch Ultra Mini Test strips for patient to use with home machine. Patient is  testing blood sugars at home 2 times daily. Dx code 250.00 100 each 6  . Vitamin D, Ergocalciferol, (DRISDOL) 50000 units CAPS capsule TAKE 1 CAPSULE BY MOUTH EVERY WEEK. 12 capsule 3   No current facility-administered medications on file prior to visit.     Social History  Substance Use Topics  . Smoking status: Never Smoker  . Smokeless tobacco: Never Used  . Alcohol use 0.0 - 0.6 oz/week     Comment: rarely    Review of Systems  Constitutional: Negative for chills and fever.  Eyes: Negative for visual disturbance.  Respiratory: Negative for cough.   Cardiovascular: Negative for chest pain and palpitations.  Gastrointestinal: Negative for nausea and vomiting.  Neurological: Negative for  headaches.      Objective:    BP 104/76   Pulse 75   Temp 98 F (36.7 C) (Oral)   Ht 5\' 1"  (1.549 m)   Wt 187 lb (84.8 kg)   SpO2 98%   BMI 35.33 kg/m  BP Readings from Last 3 Encounters:  10/25/16 104/76  07/25/16 138/72  04/20/16 120/80   Wt Readings from Last 3 Encounters:  10/25/16 187 lb (84.8 kg)  07/25/16 188 lb 12.8 oz (85.6 kg)  04/20/16 188 lb 3.2 oz (85.4 kg)    Physical Exam  Constitutional: She appears well-developed and well-nourished.  Eyes: Conjunctivae are normal.  Cardiovascular: Normal rate, regular rhythm, normal heart sounds and normal pulses.   Pulmonary/Chest: Effort normal and breath sounds normal. She has no wheezes. She has no rhonchi. She has no rales.  Neurological: She is alert.  Skin: Skin is warm and dry.  Psychiatric: She has a normal mood and affect. Her speech is normal and behavior is normal. Thought content normal.  Vitals reviewed.      Assessment & Plan:   Problem List Items Addressed This Visit      Cardiovascular and Mediastinum   Hypertension    Controlled. On low side. Trial stop HCTZ. F/u 3 months.         Endocrine   Diabetes mellitus type 2, controlled ([place])    Pending a1c. Discussed victoza again. Patient still slightly resistant to injectable. Will see what a1c shows and then discuss again with patient.       Relevant Orders   Hemoglobin A1c     Other   Anxiety    Stable. Continue on current regimen.       Dysuria - Primary    UA showed small blood, leukocytes. Patient and I agree jointly to wait on urine culture prior to treatment .       Relevant Orders   POCT urinalysis dipstick (Completed)   CULTURE, URINE COMPREHENSIVE    Other Visit Diagnoses    Screening for breast cancer       Relevant Orders   MM DIGITAL SCREENING BILATERAL       I have discontinued Ms. Jessica Houston's hydrochlorothiazide. I am also having her maintain her Calcium Carbonate Antacid (TUMS PO), multivitamin, UNABLE TO FIND,  Vitamin D (Ergocalciferol), albuterol, clotrimazole-betamethasone, fluticasone, fluticasone, glucose blood, onetouch ultrasoft, metFORMIN, nebivolol, pantoprazole, and rosuvastatin.   No orders of the defined types were placed in this encounter.   Return precautions given.   Risks, benefits, and alternatives of the medications and treatment plan prescribed today were discussed, and patient expressed understanding.   Education regarding symptom management and diagnosis given to patient on AVS.  Continue to follow with Mable Paris, FNP for routine health maintenance.  Lenard Lance and I agreed with plan.   Mable Paris, FNP

## 2016-10-26 LAB — CULTURE, URINE COMPREHENSIVE

## 2016-11-21 ENCOUNTER — Encounter: Payer: Self-pay | Admitting: Family

## 2016-12-21 ENCOUNTER — Ambulatory Visit: Payer: BC Managed Care – PPO

## 2017-01-01 ENCOUNTER — Ambulatory Visit
Admission: RE | Admit: 2017-01-01 | Discharge: 2017-01-01 | Disposition: A | Discharge status: Home / Self Care | Payer: BC Managed Care – PPO | Source: Ambulatory Visit | Attending: Family | Admitting: Family

## 2017-01-01 ENCOUNTER — Other Ambulatory Visit: Payer: Self-pay | Admitting: Family

## 2017-01-01 ENCOUNTER — Ambulatory Visit: Payer: BC Managed Care – PPO

## 2017-01-01 DIAGNOSIS — Z1231 Encounter for screening mammogram for malignant neoplasm of breast: Secondary | ICD-10-CM | POA: Insufficient documentation

## 2017-01-01 DIAGNOSIS — Z1239 Encounter for other screening for malignant neoplasm of breast: Secondary | ICD-10-CM

## 2017-01-24 ENCOUNTER — Ambulatory Visit (INDEPENDENT_AMBULATORY_CARE_PROVIDER_SITE_OTHER): Payer: BC Managed Care – PPO | Admitting: Family

## 2017-01-24 ENCOUNTER — Telehealth: Payer: Self-pay | Admitting: Radiology

## 2017-01-24 ENCOUNTER — Encounter: Payer: Self-pay | Admitting: Family

## 2017-01-24 VITALS — BP 142/78 | HR 75 | Temp 98.0°F | Ht 61.0 in | Wt 188.6 lb

## 2017-01-24 DIAGNOSIS — J302 Other seasonal allergic rhinitis: Secondary | ICD-10-CM | POA: Diagnosis not present

## 2017-01-24 DIAGNOSIS — E119 Type 2 diabetes mellitus without complications: Secondary | ICD-10-CM

## 2017-01-24 DIAGNOSIS — M79641 Pain in right hand: Secondary | ICD-10-CM

## 2017-01-24 DIAGNOSIS — I10 Essential (primary) hypertension: Secondary | ICD-10-CM

## 2017-01-24 DIAGNOSIS — K219 Gastro-esophageal reflux disease without esophagitis: Secondary | ICD-10-CM

## 2017-01-24 LAB — SEDIMENTATION RATE: Sed Rate: 18 mm/hr (ref 0–30)

## 2017-01-24 LAB — HEMOGLOBIN A1C: Hgb A1c MFr Bld: 7 % — ABNORMAL HIGH (ref 4.6–6.5)

## 2017-01-24 LAB — C-REACTIVE PROTEIN: CRP: 0.9 mg/dL (ref 0.5–20.0)

## 2017-01-24 LAB — URIC ACID: URIC ACID, SERUM: 6.2 mg/dL (ref 2.4–7.0)

## 2017-01-24 MED ORDER — METFORMIN HCL ER 500 MG PO TB24: 1000.0000 mg | ORAL_TABLET | Freq: Two times a day (BID) | ORAL | 3 refills | Status: DC

## 2017-01-24 MED ORDER — DICLOFENAC SODIUM 1 % TD GEL
4.0000 g | Freq: Four times a day (QID) | TRANSDERMAL | 3 refills | Status: DC
Start: 2017-01-24 — End: 2017-04-24

## 2017-01-24 MED ORDER — HYDROCHLOROTHIAZIDE 12.5 MG PO CAPS: 12.5000 mg | ORAL_CAPSULE | Freq: Every day | ORAL | 0 refills | Status: DC

## 2017-01-24 NOTE — Assessment & Plan Note (Signed)
Suspect degenerative disease from repetitive motions. Alternately considering gout. Pending labs. Trial voltaren

## 2017-01-24 NOTE — Progress Notes (Signed)
Pre visit review using our clinic review tool, if applicable. No additional management support is needed unless otherwise documented below in the visit note. 

## 2017-01-24 NOTE — Assessment & Plan Note (Signed)
Elevated. Restart hctz. Recheck bmp one week

## 2017-01-24 NOTE — Assessment & Plan Note (Signed)
Controlled. Continue current regimen. 

## 2017-01-24 NOTE — Telephone Encounter (Signed)
BMP was ordered today as future, did you want this test drawn today or is it correct to stay as future lab?

## 2017-01-24 NOTE — Assessment & Plan Note (Signed)
Change to metformin XR due to nausea. Pending a1c

## 2017-01-24 NOTE — Telephone Encounter (Signed)
Future. One week

## 2017-01-24 NOTE — Progress Notes (Signed)
Subjective:    Patient ID: Jessica Houston, female    DOB: December 19, 1965, 51 y.o.   MRN: [id number]  CC: Jessica Houston is a 51 y.o. female who presents today for follow up.   HPI: DM- Fasting BS 143. Compliant with metformin. Does have nausea with metformin.   HTN- Has not been exercising. Denies exertional chest pain or pressure, numbness or tingling radiating to left arm or jaw, palpitations, dizziness, frequent headaches, changes in vision, or shortness of breath.   Complains of right metacarpal swelling; Works [employer].   Complains sinus pressure x yesterday. Started zrytec. Has some post flonase drip. No fever, cough, wheezing, sob.         HISTORY:  Past Medical History:  Diagnosis Date  . Anxiety   . Asthma   . Bronchitis   . Cancer ([place])    skin  . Diabetes mellitus    HgbA1c 6.8%, diet controlled. Optho 2010, urine microalbumin 10/2010  . Esophageal reflux    CONTROLLED ON CURRENT MEDICATION  . Family history of adverse reaction to anesthesia    dad nausea and vomiting  . Hyperlipidemia   . Hypertension   . PONV (postoperative nausea and vomiting)    with C-section  . S/P endoscopy    normal per pt. done at [facility]  . Sleep apnea    On CPAP  . Vitamin D deficiency    Past Surgical History:  Procedure Laterality Date  . CESAREAN SECTION N/A   . HEMORRHOID SURGERY N/A 06/21/2015   Procedure: Internal and external hemorrhoidectomy;  Surgeon: Leonie Green, MD;  Location: [facility] ORS;  Service: General;  Laterality: N/A;   Family History  Problem Relation Age of Onset  . Hyperlipidemia Mother   . Diabetes Father   . Heart disease Maternal Grandmother   . Diabetes Maternal Grandfather   . Stroke Paternal Grandmother   . Diabetes Paternal Grandfather   . Breast cancer Neg Hx     Allergies: Ace inhibitors; Ciprofloxacin; Codeine; and Onglyza [saxagliptin] Current Outpatient Prescriptions on File Prior to Visit  Medication  Sig Dispense Refill  . albuterol (PROVENTIL HFA;VENTOLIN HFA) 108 (90 Base) MCG/ACT inhaler Inhale 2 puffs into the lungs every 6 (six) hours as needed for wheezing. 1 Inhaler 6  . Calcium Carbonate Antacid (TUMS PO) Take 1 tablet by mouth as needed.      . clotrimazole-betamethasone (LOTRISONE) cream apply to affected area(s) twice daily 30 g 0  . fluticasone (FLONASE) 50 MCG/ACT nasal spray Use 2 sprays in each nostril once daily as needed 16 g 5  . fluticasone (FLOVENT HFA) 110 MCG/ACT inhaler Inhale 1 puff into the lungs 2 (two) times daily. 12 g 11  . glucose blood test strip Use as instructed with One Touch Delica Glucometer.  Dx: E11.9 100 each 4  . Lancets (ONETOUCH ULTRASOFT) lancets 1 each by Other route 2 (two) times daily. Dispense one touch nano lancets 150 each 4  . Multiple Vitamin (MULTIVITAMIN) capsule Take 1 capsule by mouth daily.      . nebivolol (BYSTOLIC) 5 MG tablet TAKE 1 TABLET(5 MG) BY MOUTH DAILY 90 tablet 3  . pantoprazole (PROTONIX) 40 MG tablet TAKE 1 TABLET(40 MG) BY MOUTH DAILY 30 tablet 9  . rosuvastatin (CRESTOR) 5 MG tablet Take 1 tablet (5 mg total) by mouth daily. 90 tablet 3  . UNABLE TO FIND One Touch Ultra Mini Test strips for patient to use with home machine. Patient is  testing blood sugars at home 2 times daily. Dx code 250.00 100 each 6  . Vitamin D, Ergocalciferol, (DRISDOL) 50000 units CAPS capsule TAKE 1 CAPSULE BY MOUTH EVERY WEEK. 12 capsule 3   No current facility-administered medications on file prior to visit.     Social History  Substance Use Topics  . Smoking status: Never Smoker  . Smokeless tobacco: Never Used  . Alcohol use 0.0 - 0.6 oz/week     Comment: rarely    Review of Systems  Constitutional: Negative for chills and fever.  HENT: Positive for congestion and sinus pressure. Negative for sore throat.   Respiratory: Negative for cough and shortness of breath.   Cardiovascular: Negative for chest pain and palpitations.    Gastrointestinal: Negative for nausea and vomiting.  Musculoskeletal: Positive for joint swelling.      Objective:    BP (!) 142/78   Pulse 75   Temp 98 F (36.7 C) (Oral)   Ht 5\' 1"  (1.549 m)   Wt 188 lb 9.6 oz (85.5 kg)   SpO2 97%   BMI 35.64 kg/m  BP Readings from Last 3 Encounters:  01/24/17 (!) 142/78  10/25/16 104/76  07/25/16 138/72   Wt Readings from Last 3 Encounters:  01/24/17 188 lb 9.6 oz (85.5 kg)  10/25/16 187 lb (84.8 kg)  07/25/16 188 lb 12.8 oz (85.6 kg)    Physical Exam  Constitutional: She appears well-developed and well-nourished.  HENT:  Head: Normocephalic and atraumatic.  Right Ear: Hearing, tympanic membrane, external ear and ear canal normal. No drainage, swelling or tenderness. No foreign bodies. Tympanic membrane is not erythematous and not bulging. No middle ear effusion. No decreased hearing is noted.  Left Ear: Hearing, tympanic membrane, external ear and ear canal normal. No drainage, swelling or tenderness. No foreign bodies. Tympanic membrane is not erythematous and not bulging.  No middle ear effusion. No decreased hearing is noted.  Nose: Nose normal. No rhinorrhea. Right sinus exhibits no maxillary sinus tenderness and no frontal sinus tenderness. Left sinus exhibits no maxillary sinus tenderness and no frontal sinus tenderness.  Mouth/Throat: Uvula is midline, oropharynx is clear and moist and mucous membranes are normal. No oropharyngeal exudate, posterior oropharyngeal edema, posterior oropharyngeal erythema or tonsillar abscesses.  Eyes: Conjunctivae are normal.  Cardiovascular: Normal rate, regular rhythm, normal heart sounds and normal pulses.   Pulmonary/Chest: Effort normal and breath sounds normal. She has no wheezes. She has no rhonchi. She has no rales.  Musculoskeletal:       Right hand: She exhibits bony tenderness. She exhibits normal range of motion, no tenderness and no swelling. Normal sensation noted. Normal strength noted.        Hands: Right 2nd metacarpal joint tenderness. No bony deformity, erythema, swelling. No increased warmth  Lymphadenopathy:       Head (right side): No submental, no submandibular, no tonsillar, no preauricular, no posterior auricular and no occipital adenopathy present.       Head (left side): No submental, no submandibular, no tonsillar, no preauricular, no posterior auricular and no occipital adenopathy present.    She has no cervical adenopathy.  Neurological: She is alert.  Skin: Skin is warm and dry.  Psychiatric: She has a normal mood and affect. Her speech is normal and behavior is normal. Thought content normal.  Vitals reviewed.      Assessment & Plan:   Problem List Items Addressed This Visit      Cardiovascular and Mediastinum   Hypertension  Elevated. Restart hctz. Recheck bmp one week      Relevant Medications   hydrochlorothiazide (MICROZIDE) 12.5 MG capsule   Other Relevant Orders   Basic metabolic panel     Respiratory   Allergic rhinitis    Afebrile. Duration one day. We jointly agreed on conservative therapy. Will let me know if not better        Digestive   GERD (gastroesophageal reflux disease)    Controlled. Continue current regimen        Endocrine   Diabetes mellitus type 2, controlled ([place])    Change to metformin XR due to nausea. Pending a1c      Relevant Medications   metFORMIN (GLUCOPHAGE XR) 500 MG 24 hr tablet   Other Relevant Orders   Hemoglobin A1c     Other   Right hand pain - Primary    Suspect degenerative disease from repetitive motions. Alternately considering gout. Pending labs. Trial voltaren      Relevant Medications   diclofenac sodium (VOLTAREN) 1 % GEL   Other Relevant Orders   Uric acid   Sedimentation rate   C-reactive protein       I have discontinued Ms. Mcgahan's metFORMIN. I am also having her start on diclofenac sodium and metFORMIN. Additionally, I am having her maintain her Calcium Carbonate  Antacid (TUMS PO), multivitamin, UNABLE TO FIND, Vitamin D (Ergocalciferol), albuterol, clotrimazole-betamethasone, fluticasone, fluticasone, glucose blood, onetouch ultrasoft, nebivolol, pantoprazole, rosuvastatin, and hydrochlorothiazide.   Meds ordered this encounter  Medications  . diclofenac sodium (VOLTAREN) 1 % GEL    Sig: Apply 4 g topically 4 (four) times daily.    Dispense:  1 Tube    Refill:  3    Order Specific Question:   Supervising Provider    Answer:   Derrel Nip, TERESA L [[id number]]  . hydrochlorothiazide (MICROZIDE) 12.5 MG capsule    Sig: Take 1 capsule (12.5 mg total) by mouth daily.    Dispense:  90 capsule    Refill:  0    Order Specific Question:   Supervising Provider    Answer:   Deborra Medina L [[id number]]  . metFORMIN (GLUCOPHAGE XR) 500 MG 24 hr tablet    Sig: Take 2 tablets (1,000 mg total) by mouth 2 (two) times daily with a meal.    Dispense:  90 tablet    Refill:  3    Order Specific Question:   Supervising Provider    Answer:   Crecencio Mc [[id number]]    Return precautions given.   Risks, benefits, and alternatives of the medications and treatment plan prescribed today were discussed, and patient expressed understanding.   Education regarding symptom management and diagnosis given to patient on AVS.  Continue to follow with Mable Paris, FNP for routine health maintenance.   Lenard Lance and I agreed with plan.   Mable Paris, FNP

## 2017-01-24 NOTE — Patient Instructions (Signed)
mucinex  voltaren gel  Labs today  Start HCTZ - monitor BP at home  Repeat labs one week

## 2017-01-24 NOTE — Assessment & Plan Note (Signed)
Afebrile. Duration one day. We jointly agreed on conservative therapy. Will let me know if not better

## 2017-01-31 ENCOUNTER — Other Ambulatory Visit: Payer: BC Managed Care – PPO

## 2017-02-08 LAB — HM DIABETES EYE EXAM

## 2017-02-28 ENCOUNTER — Telehealth: Payer: Self-pay

## 2017-02-28 NOTE — Telephone Encounter (Signed)
PA for Voltaren gel faxed to pharmacy

## 2017-03-08 ENCOUNTER — Other Ambulatory Visit: Payer: Self-pay | Admitting: *Deleted

## 2017-03-08 MED ORDER — ROSUVASTATIN CALCIUM 5 MG PO TABS: 5.0000 mg | ORAL_TABLET | Freq: Every day | ORAL | 3 refills | Status: DC

## 2017-04-24 ENCOUNTER — Encounter: Payer: Self-pay | Admitting: Family

## 2017-04-24 ENCOUNTER — Ambulatory Visit (INDEPENDENT_AMBULATORY_CARE_PROVIDER_SITE_OTHER): Payer: BC Managed Care – PPO | Admitting: Family

## 2017-04-24 ENCOUNTER — Telehealth: Payer: Self-pay | Admitting: *Deleted

## 2017-04-24 VITALS — BP 126/84 | HR 75 | Temp 97.7°F | Ht 61.0 in | Wt 189.0 lb

## 2017-04-24 DIAGNOSIS — E119 Type 2 diabetes mellitus without complications: Secondary | ICD-10-CM | POA: Diagnosis not present

## 2017-04-24 DIAGNOSIS — M79641 Pain in right hand: Secondary | ICD-10-CM

## 2017-04-24 DIAGNOSIS — I10 Essential (primary) hypertension: Secondary | ICD-10-CM

## 2017-04-24 DIAGNOSIS — M545 Low back pain, unspecified: Secondary | ICD-10-CM

## 2017-04-24 LAB — URINALYSIS, ROUTINE W REFLEX MICROSCOPIC
BILIRUBIN URINE: NEGATIVE
KETONES UR: NEGATIVE
LEUKOCYTES UA: NEGATIVE
Nitrite: NEGATIVE
RBC / HPF: NONE SEEN (ref 0–?)
Specific Gravity, Urine: <=1.005 — AB (ref 1.000–1.030)
Total Protein, Urine: NEGATIVE
UROBILINOGEN UA: 0.2 (ref 0.0–1.0)
Urine Glucose: NEGATIVE
pH: 6 (ref 5.0–8.0)

## 2017-04-24 MED ORDER — DICLOFENAC SODIUM 1 % TD GEL: 4.0000 g | Freq: Four times a day (QID) | TRANSDERMAL | 3 refills | Status: AC

## 2017-04-24 NOTE — Progress Notes (Signed)
Subjective:    Patient ID: Jessica Houston, female    DOB: 1966/06/18, 51 y.o.   MRN: [id number]  CC: Jessica Houston is a 51 y.o. female who presents today for follow up.   HPI: DM- last a1c 7. Fasting blood sugars 160 in the morning. Not exercising currently. Compliant with metformin 1000mg  BID.   HTN- compliant with medication. Denies exertional chest pain or pressure, numbness or tingling radiating to left arm or jaw, palpitations, dizziness, frequent headaches, changes in vision, or shortness of breath.    Hand pain still there, mild, intermittent- was unable to use voltaren  Gel. Tried aspercreme without relief.   Father had agent orange exposure and she has concerns of cardiac symptoms. Saw cardiology 2015 at [facility]- had echo  Would like to be checked for urinary infection. Not having urinary burning, urinary frequency. Endorses dull low back ache intermittent for past couple of days. No injury. Heat helps low back pain. No h/o cancer. NO numbness, tingling.     HISTORY:  Past Medical History:  Diagnosis Date  . Anxiety   . Asthma   . Bronchitis   . Cancer ([place])    skin  . Diabetes mellitus    HgbA1c 6.8%, diet controlled. Optho 2010, urine microalbumin 10/2010  . Esophageal reflux    CONTROLLED ON CURRENT MEDICATION  . Family history of adverse reaction to anesthesia    dad nausea and vomiting  . Hyperlipidemia   . Hypertension   . PONV (postoperative nausea and vomiting)    with C-section  . S/P endoscopy    normal per pt. done at [facility] clinic  . Sleep apnea    On CPAP  . Vitamin D deficiency    Past Surgical History:  Procedure Laterality Date  . CESAREAN SECTION N/A   . HEMORRHOID SURGERY N/A 06/21/2015   Procedure: Internal and external hemorrhoidectomy;  Surgeon: Leonie Green, MD;  Location: [facility] ORS;  Service: General;  Laterality: N/A;   Family History  Problem Relation Age of Onset  . Hyperlipidemia Mother   . Diabetes Father   .  Coronary artery disease Father        agent orange contact  . Heart disease Maternal Grandmother   . Diabetes Maternal Grandfather   . Stroke Paternal Grandmother   . Diabetes Paternal Grandfather   . Breast cancer Neg Hx     Allergies: Ace inhibitors; Ciprofloxacin; Codeine; and Onglyza [saxagliptin] Current Outpatient Prescriptions on File Prior to Visit  Medication Sig Dispense Refill  . Calcium Carbonate Antacid (TUMS PO) Take 1 tablet by mouth as needed.      . clotrimazole-betamethasone (LOTRISONE) cream apply to affected area(s) twice daily 30 g 0  . fluticasone (FLONASE) 50 MCG/ACT nasal spray Use 2 sprays in each nostril once daily as needed 16 g 5  . fluticasone (FLOVENT HFA) 110 MCG/ACT inhaler Inhale 1 puff into the lungs 2 (two) times daily. 12 g 11  . glucose blood test strip Use as instructed with One Touch Delica Glucometer.  Dx: E11.9 100 each 4  . hydrochlorothiazide (MICROZIDE) 12.5 MG capsule Take 1 capsule (12.5 mg total) by mouth daily. 90 capsule 0  . Lancets (ONETOUCH ULTRASOFT) lancets 1 each by Other route 2 (two) times daily. Dispense one touch nano lancets 150 each 4  . nebivolol (BYSTOLIC) 5 MG tablet TAKE 1 TABLET(5 MG) BY MOUTH DAILY 90 tablet 3  . pantoprazole (PROTONIX) 40 MG tablet TAKE 1 TABLET(40 MG) BY MOUTH DAILY  30 tablet 9  . rosuvastatin (CRESTOR) 5 MG tablet Take 1 tablet (5 mg total) by mouth daily. 90 tablet 3  . UNABLE TO FIND One Touch Ultra Mini Test strips for patient to use with home machine. Patient is testing blood sugars at home 2 times daily. Dx code 250.00 100 each 6  . Vitamin D, Ergocalciferol, (DRISDOL) 50000 units CAPS capsule TAKE 1 CAPSULE BY MOUTH EVERY WEEK. 12 capsule 3   No current facility-administered medications on file prior to visit.     Social History  Substance Use Topics  . Smoking status: Never Smoker  . Smokeless tobacco: Never Used  . Alcohol use 0.0 - 0.6 oz/week     Comment: rarely    Review of Systems    Constitutional: Negative for chills and fever.  Respiratory: Negative for cough.   Cardiovascular: Negative for chest pain and palpitations.  Gastrointestinal: Negative for abdominal pain, nausea and vomiting.  Genitourinary: Negative for dysuria and frequency.  Musculoskeletal: Positive for back pain.      Objective:    BP 126/84   Pulse 75   Temp 97.7 F (36.5 C) (Oral)   Ht 5\' 1"  (1.549 m)   Wt 189 lb (85.7 kg)   SpO2 98%   BMI 35.71 kg/m  BP Readings from Last 3 Encounters:  04/24/17 126/84  01/24/17 (!) 142/78  10/25/16 104/76   Wt Readings from Last 3 Encounters:  04/24/17 189 lb (85.7 kg)  01/24/17 188 lb 9.6 oz (85.5 kg)  10/25/16 187 lb (84.8 kg)    Physical Exam  Constitutional: She appears well-developed and well-nourished.  Eyes: Conjunctivae are normal.  Cardiovascular: Normal rate, regular rhythm, normal heart sounds and normal pulses.   Pulmonary/Chest: Effort normal and breath sounds normal. She has no wheezes. She has no rhonchi. She has no rales.  Abdominal: There is no CVA tenderness.  Musculoskeletal:       Lumbar back: She exhibits normal range of motion, no tenderness, no bony tenderness, no swelling, no edema, no pain and no spasm.  Full range of motion with flexion, tension, lateral side bends. No bony tenderness. No pain, numbness, tingling elicited with single leg raise bilaterally.   Neurological: She is alert. She has normal strength. No sensory deficit.  Reflex Scores:      Patellar reflexes are 2+ on the right side and 2+ on the left side. Sensation and strength intact bilateral lower extremities.  Skin: Skin is warm and dry.  Psychiatric: She has a normal mood and affect. Her speech is normal and behavior is normal. Thought content normal.  Vitals reviewed.      Assessment & Plan:   Problem List Items Addressed This Visit      Cardiovascular and Mediastinum   Hypertension    Controlled. Will continue current regimen. Pending  CMP. Patient's father had exposure to agent orange and patient has been through cardiac testing in the past. She would like to try one of those life screening vehicles which is coming to [place]. She politely declines referral to cardiology at this point. She'll follow-up with me and let me know what screens were available through the service; we will decide at that point if she still needs cardiac evaluation.        Endocrine   Diabetes mellitus type 2, controlled ([place])    Pending a1c      Relevant Medications   metFORMIN (GLUCOPHAGE) 500 MG tablet     Other   Bilateral low back  pain without sciatica    Describes as ache. Responds to heat. Reassured by benign exam. Pending urinalysis to ensure no underlying UTI. If normal, we discussed conservative therapy including heat, gentle exercising. Return precautions given.      Right hand pain - Primary    Stable, unchanged. Will try again to prescribe voltaren  gel      Relevant Medications   diclofenac sodium (VOLTAREN) 1 % GEL       I have discontinued Ms. Bornemann's multivitamin, albuterol, and diclofenac sodium. I am also having her start on diclofenac sodium. Additionally, I am having her maintain her Calcium Carbonate Antacid (TUMS PO), UNABLE TO FIND, Vitamin D (Ergocalciferol), clotrimazole-betamethasone, fluticasone, fluticasone, glucose blood, onetouch ultrasoft, nebivolol, pantoprazole, hydrochlorothiazide, rosuvastatin, and metFORMIN.   Meds ordered this encounter  Medications  . metFORMIN (GLUCOPHAGE) 500 MG tablet    Sig: TK 2 TS PO BID WITH A MEAL    Refill:  3  . diclofenac sodium (VOLTAREN) 1 % GEL    Sig: Apply 4 g topically 4 (four) times daily.    Dispense:  1 Tube    Refill:  3    Order Specific Question:   Supervising Provider    Answer:   Crecencio Mc [[id number]]    Return precautions given.   Risks, benefits, and alternatives of the medications and treatment plan prescribed today were discussed, and  patient expressed understanding.   Education regarding symptom management and diagnosis given to patient on AVS.  Continue to follow with Burnard Hawthorne, FNP for routine health maintenance.   Lenard Lance and I agreed with plan.   Mable Paris, FNP

## 2017-04-24 NOTE — Assessment & Plan Note (Signed)
Pending a1c. 

## 2017-04-24 NOTE — Telephone Encounter (Signed)
done

## 2017-04-24 NOTE — Assessment & Plan Note (Signed)
Describes as ache. Responds to heat. Reassured by benign exam. Pending urinalysis to ensure no underlying UTI. If normal, we discussed conservative therapy including heat, gentle exercising. Return precautions given.

## 2017-04-24 NOTE — Assessment & Plan Note (Signed)
Stable, unchanged. Will try again to prescribe voltaren  gel

## 2017-04-24 NOTE — Progress Notes (Signed)
Pre visit review using our clinic review tool, if applicable. No additional management support is needed unless otherwise documented below in the visit note. 

## 2017-04-24 NOTE — Assessment & Plan Note (Signed)
Controlled. Will continue current regimen. Pending CMP. Patient's father had exposure to agent orange and patient has been through cardiac testing in the past. She would like to try one of those life screening vehicles which is coming to [place]. She politely declines referral to cardiology at this point. She'll follow-up with me and let me know what screens were available through the service; we will decide at that point if she still needs cardiac evaluation.

## 2017-04-24 NOTE — Telephone Encounter (Signed)
Remove bmp

## 2017-04-24 NOTE — Patient Instructions (Addendum)
Let me know if you are able to locate [facility] screening  as we discussed.   I will place cardiology referral for you depending on what services they provide.   Let me know about what topicals were approved via [employer].   Monitor BP at home; goal < 135/85

## 2017-04-24 NOTE — Telephone Encounter (Signed)
Pt was sent to lab this morning with NO lab orders. She stated that she gets her A1C checked every 3 months. Her last A1C was on 01/24/17 (so patient is too early). She wants to come back next week. She now has a future order for CMP, BMP, & A1C. Which metabolic panel can we remove?

## 2017-04-25 LAB — URINE CULTURE

## 2017-04-30 ENCOUNTER — Telehealth: Payer: Self-pay

## 2017-04-30 NOTE — Telephone Encounter (Signed)
PA for Diclofenac sodium gel 1% started on cover my meds

## 2017-05-01 ENCOUNTER — Other Ambulatory Visit: Payer: BC Managed Care – PPO

## 2017-05-02 ENCOUNTER — Other Ambulatory Visit (INDEPENDENT_AMBULATORY_CARE_PROVIDER_SITE_OTHER): Payer: BC Managed Care – PPO

## 2017-05-02 DIAGNOSIS — E119 Type 2 diabetes mellitus without complications: Secondary | ICD-10-CM | POA: Diagnosis not present

## 2017-05-02 NOTE — Addendum Note (Signed)
Addended by: Arby Barrette on: 05/02/2017 03:42 PM   Modules accepted: Orders

## 2017-05-03 LAB — COMPREHENSIVE METABOLIC PANEL
ALK PHOS: 55 U/L (ref 39–117)
ALT: 31 U/L (ref 0–35)
AST: 23 U/L (ref 0–37)
Albumin: 4 g/dL (ref 3.5–5.2)
BUN: 16 mg/dL (ref 6–23)
CHLORIDE: 101 meq/L (ref 96–112)
CO2: 28 meq/L (ref 19–32)
Calcium: 9.6 mg/dL (ref 8.4–10.5)
Creatinine, Ser: 0.9 mg/dL (ref 0.40–1.20)
GFR: 70.09 mL/min (ref >60.00)
GLUCOSE: 154 mg/dL — AB (ref 70–99)
POTASSIUM: 3.7 meq/L (ref 3.5–5.1)
Sodium: 137 mEq/L (ref 135–145)
Total Bilirubin: 0.2 mg/dL (ref 0.2–1.2)
Total Protein: 7 g/dL (ref 6.0–8.3)

## 2017-05-03 LAB — MICROALBUMIN / CREATININE URINE RATIO
Creatinine,U: 106 mg/dL
MICROALB/CREAT RATIO: 0.9 mg/g (ref 0.0–30.0)
Microalb, Ur: 1 mg/dL (ref 0.0–1.9)

## 2017-05-03 LAB — HEMOGLOBIN A1C: Hgb A1c MFr Bld: 7.5 % — ABNORMAL HIGH (ref 4.6–6.5)

## 2017-05-08 ENCOUNTER — Encounter: Payer: Self-pay | Admitting: Family

## 2017-05-09 ENCOUNTER — Encounter: Payer: Self-pay | Admitting: Family

## 2017-05-13 ENCOUNTER — Other Ambulatory Visit: Payer: Self-pay | Admitting: Family

## 2017-05-13 DIAGNOSIS — E119 Type 2 diabetes mellitus without complications: Secondary | ICD-10-CM

## 2017-05-13 MED ORDER — EMPAGLIFLOZIN 10 MG PO TABS: 10.0000 mg | ORAL_TABLET | Freq: Every morning | ORAL | 1 refills | Status: DC

## 2017-05-27 ENCOUNTER — Other Ambulatory Visit: Payer: Self-pay

## 2017-05-27 MED ORDER — PANTOPRAZOLE SODIUM 40 MG PO TBEC: DELAYED_RELEASE_TABLET | ORAL | 9 refills | Status: DC

## 2017-05-27 MED ORDER — NEBIVOLOL HCL 5 MG PO TABS: ORAL_TABLET | ORAL | 3 refills | Status: DC

## 2017-05-30 ENCOUNTER — Other Ambulatory Visit: Payer: Self-pay

## 2017-05-30 MED ORDER — FLUTICASONE PROPIONATE HFA 110 MCG/ACT IN AERO: 1.0000 | INHALATION_SPRAY | Freq: Two times a day (BID) | RESPIRATORY_TRACT | 11 refills | Status: DC

## 2017-05-30 NOTE — Telephone Encounter (Signed)
Medication has been refilled.

## 2017-06-20 ENCOUNTER — Other Ambulatory Visit: Payer: Self-pay

## 2017-06-20 MED ORDER — METFORMIN HCL 500 MG PO TABS: ORAL_TABLET | ORAL | 3 refills | Status: DC

## 2017-07-11 NOTE — Telephone Encounter (Signed)
error 

## 2017-08-06 ENCOUNTER — Ambulatory Visit (INDEPENDENT_AMBULATORY_CARE_PROVIDER_SITE_OTHER): Payer: BC Managed Care – PPO | Admitting: Family

## 2017-08-06 ENCOUNTER — Encounter: Payer: Self-pay | Admitting: Family

## 2017-08-06 VITALS — BP 136/64 | HR 77 | Temp 98.3°F | Ht 61.0 in | Wt 183.4 lb

## 2017-08-06 DIAGNOSIS — I10 Essential (primary) hypertension: Secondary | ICD-10-CM

## 2017-08-06 DIAGNOSIS — J45909 Unspecified asthma, uncomplicated: Secondary | ICD-10-CM | POA: Diagnosis not present

## 2017-08-06 DIAGNOSIS — G4733 Obstructive sleep apnea (adult) (pediatric): Secondary | ICD-10-CM | POA: Diagnosis not present

## 2017-08-06 DIAGNOSIS — E119 Type 2 diabetes mellitus without complications: Secondary | ICD-10-CM

## 2017-08-06 MED ORDER — DULAGLUTIDE 0.75 MG/0.5ML ~~LOC~~ SOAJ: 0.7500 mg | SUBCUTANEOUS | 6 refills | Status: DC

## 2017-08-06 NOTE — Progress Notes (Signed)
Subjective:    Patient ID: Jessica Houston, female    DOB: 30-Nov-1965, 51 y.o.   MRN: [id number]  CC: Jessica Houston is a 51 y.o. female who presents today for follow up.   HPI: HTN- compliant with medication. Stopped HCTZ as felt dizzy which has resolved. Denies exertional chest pain or pressure, numbness or tingling radiating to left arm or jaw, palpitations, dizziness, frequent headaches, changes in vision, or shortness of breath.    DM- compliant with medications. Takes metformin 500mg  QD. Feels tired with onset when started the jardiance. FBG 140, some values over 150, not many. No depression. No h/o thyroid cancer.   Asthma- still coughing, periodically. 'Feels congested.' On flovent, zyrtec without a difference. No rescue inhaler and 'doesnt think she needs'. Cough worse in cold weather. Declines a rescue inhaler.  No wheezing, SOB.   OSA- not wearing machine as 'the machine breathes faster than she does. ' Titrated last year and didn't help.     HISTORY:  Past Medical History:  Diagnosis Date  . Anxiety   . Asthma   . Bronchitis   . Cancer ([place])    skin  . Diabetes mellitus    HgbA1c 6.8%, diet controlled. Optho 2010, urine microalbumin 10/2010  . Esophageal reflux    CONTROLLED ON CURRENT MEDICATION  . Family history of adverse reaction to anesthesia    dad nausea and vomiting  . Hyperlipidemia   . Hypertension   . PONV (postoperative nausea and vomiting)    with C-section  . S/P endoscopy    normal per pt. done at [facility] clinic  . Sleep apnea    On CPAP  . Vitamin D deficiency    Past Surgical History:  Procedure Laterality Date  . CESAREAN SECTION N/A   . HEMORRHOID SURGERY N/A 06/21/2015   Procedure: Internal and external hemorrhoidectomy;  Surgeon: Leonie Green, MD;  Location: [facility] ORS;  Service: General;  Laterality: N/A;   Family History  Problem Relation Age of Onset  . Hyperlipidemia Mother   . Diabetes Father   . Coronary artery  disease Father        agent orange contact  . Heart disease Maternal Grandmother   . Diabetes Maternal Grandfather   . Stroke Paternal Grandmother   . Diabetes Paternal Grandfather   . Breast cancer Neg Hx     Allergies: Ace inhibitors; Ciprofloxacin; Codeine; and Onglyza [saxagliptin] Current Outpatient Prescriptions on File Prior to Visit  Medication Sig Dispense Refill  . Calcium Carbonate Antacid (TUMS PO) Take 1 tablet by mouth as needed.      . clotrimazole-betamethasone (LOTRISONE) cream apply to affected area(s) twice daily 30 g 0  . diclofenac sodium (VOLTAREN) 1 % GEL Apply 4 g topically 4 (four) times daily. 1 Tube 3  . fluticasone (FLONASE) 50 MCG/ACT nasal spray Use 2 sprays in each nostril once daily as needed 16 g 5  . fluticasone (FLOVENT HFA) 110 MCG/ACT inhaler Inhale 1 puff into the lungs 2 (two) times daily. 12 g 11  . glucose blood test strip Use as instructed with One Touch Delica Glucometer.  Dx: E11.9 100 each 4  . Lancets (ONETOUCH ULTRASOFT) lancets 1 each by Other route 2 (two) times daily. Dispense one touch nano lancets 150 each 4  . metFORMIN (GLUCOPHAGE) 500 MG tablet TAKE 2 TABS PO BID WITH A MEAL 60 tablet 3  . nebivolol (BYSTOLIC) 5 MG tablet TAKE 1 TABLET(5 MG) BY MOUTH DAILY 90 tablet 3  .  pantoprazole (PROTONIX) 40 MG tablet TAKE 1 TABLET(40 MG) BY MOUTH DAILY 30 tablet 9  . rosuvastatin (CRESTOR) 5 MG tablet Take 1 tablet (5 mg total) by mouth daily. 90 tablet 3  . UNABLE TO FIND One Touch Ultra Mini Test strips for patient to use with home machine. Patient is testing blood sugars at home 2 times daily. Dx code 250.00 100 each 6  . Vitamin D, Ergocalciferol, (DRISDOL) 50000 units CAPS capsule TAKE 1 CAPSULE BY MOUTH EVERY WEEK. 12 capsule 3   No current facility-administered medications on file prior to visit.     Social History  Substance Use Topics  . Smoking status: Never Smoker  . Smokeless tobacco: Never Used  . Alcohol use 0.0 - 0.6 oz/week      Comment: rarely    Review of Systems  Constitutional: Negative for chills and fever.  HENT: Positive for congestion. Negative for sinus pressure and sore throat.   Respiratory: Positive for cough. Negative for shortness of breath and wheezing.   Cardiovascular: Negative for chest pain and palpitations.  Gastrointestinal: Negative for abdominal pain, nausea and vomiting.  Musculoskeletal: Negative for arthralgias and myalgias.  Skin: Negative for rash.  Neurological: Negative for headaches.  Hematological: Negative for adenopathy.      Objective:    BP 136/64   Pulse 77   Temp 98.3 F (36.8 C) (Oral)   Ht 5\' 1"  (1.549 m)   Wt 183 lb 6.4 oz (83.2 kg)   SpO2 96%   BMI 34.65 kg/m  BP Readings from Last 3 Encounters:  08/06/17 136/64  04/24/17 126/84  01/24/17 (!) 142/78   Wt Readings from Last 3 Encounters:  08/06/17 183 lb 6.4 oz (83.2 kg)  04/24/17 189 lb (85.7 kg)  01/24/17 188 lb 9.6 oz (85.5 kg)    Physical Exam  Constitutional: She appears well-developed and well-nourished.  Eyes: Conjunctivae are normal.  Cardiovascular: Normal rate, regular rhythm, normal heart sounds and normal pulses.   Pulmonary/Chest: Effort normal and breath sounds normal. She has no wheezes. She has no rhonchi. She has no rales.  Neurological: She is alert.  Skin: Skin is warm and dry.  Psychiatric: She has a normal mood and affect. Her speech is normal and behavior is normal. Thought content normal.  Vitals reviewed.      Assessment & Plan:   Problem List Items Addressed This Visit      Cardiovascular and Mediastinum   Hypertension    Controlled. Continue current regimen.      Relevant Orders   Comprehensive metabolic panel     Respiratory   Asthma, chronic    Continues to have chronic cough. For congestion, we discussed conservative measures including Mucinex. Patient will let me know if symptoms not improved; at that point, consider trying a different inhaler.       Obstructive sleep apnea    Not compliant with machine . Discussed with patient's suspect not wearing cipap is contributing to fatigue. Discussed risks of untreated OSA. Patient understands however she politely declines CPAP titration at this time.        Endocrine   Diabetes mellitus type 2, controlled ([place]) - Primary    Suspect improved however patient feels since starting jardiance, she has felt more fatigue than usual. We will stop jardiance and see if she feels this way on the trulicity.  Patient will let me know how she's doing. Follow-up in 3 months      Relevant Medications   Dulaglutide (TRULICITY)  0.75 MG/0.5ML SOPN   Other Relevant Orders   Hemoglobin A1c   TSH   VITAMIN D 25 Hydroxy (Vit-D Deficiency, Fractures)       I have discontinued Ms. Maslanka's hydrochlorothiazide and empagliflozin. I am also having her start on Dulaglutide. Additionally, I am having her maintain her Calcium Carbonate Antacid (TUMS PO), UNABLE TO FIND, Vitamin D (Ergocalciferol), clotrimazole-betamethasone, fluticasone, glucose blood, onetouch ultrasoft, rosuvastatin, diclofenac sodium, pantoprazole, nebivolol, fluticasone, and metFORMIN.   Meds ordered this encounter  Medications  . Dulaglutide (TRULICITY) 2.88 FD/7.4UZ SOPN    Sig: Inject 0.75 mg into the skin once a week.    Dispense:  1 pen    Refill:  6    Order Specific Question:   Supervising Provider    Answer:   Crecencio Mc [[id number]]    Return precautions given.   Risks, benefits, and alternatives of the medications and treatment plan prescribed today were discussed, and patient expressed understanding.   Education regarding symptom management and diagnosis given to patient on AVS.  Continue to follow with Burnard Hawthorne, FNP for routine health maintenance.   Lenard Lance and I agreed with plan.   Mable Paris, FNP

## 2017-08-06 NOTE — Patient Instructions (Addendum)
Labs today  Trial of trulicity and let me know if fatigue improves- if not please come back for re evaluation  Follow up  3 months

## 2017-08-06 NOTE — Assessment & Plan Note (Addendum)
Not compliant with machine . Discussed with patient's suspect not wearing cipap is contributing to fatigue. Discussed risks of untreated OSA. Patient understands however she politely declines CPAP titration at this time.

## 2017-08-07 LAB — COMPREHENSIVE METABOLIC PANEL
ALK PHOS: 55 U/L (ref 39–117)
ALT: 20 U/L (ref 0–35)
AST: 17 U/L (ref 0–37)
Albumin: 4.2 g/dL (ref 3.5–5.2)
BUN: 18 mg/dL (ref 6–23)
CO2: 25 meq/L (ref 19–32)
Calcium: 9.5 mg/dL (ref 8.4–10.5)
Chloride: 101 mEq/L (ref 96–112)
Creatinine, Ser: 1.01 mg/dL (ref 0.40–1.20)
GFR: 61.29 mL/min (ref >60.00)
Glucose, Bld: 98 mg/dL (ref 70–99)
Potassium: 3.9 mEq/L (ref 3.5–5.1)
Sodium: 135 mEq/L (ref 135–145)
Total Bilirubin: 0.3 mg/dL (ref 0.2–1.2)
Total Protein: 7.5 g/dL (ref 6.0–8.3)

## 2017-08-07 LAB — VITAMIN D 25 HYDROXY (VIT D DEFICIENCY, FRACTURES): VITD: 34.09 ng/mL (ref 30.00–100.00)

## 2017-08-07 LAB — HEMOGLOBIN A1C: HEMOGLOBIN A1C: 7.3 % — AB (ref 4.6–6.5)

## 2017-08-07 LAB — TSH: TSH: 1.38 u[IU]/mL (ref 0.35–4.50)

## 2017-08-07 NOTE — Assessment & Plan Note (Signed)
Controlled. Continue current regimen. 

## 2017-08-07 NOTE — Assessment & Plan Note (Addendum)
Continues to have chronic cough. For congestion, we discussed conservative measures including Mucinex. Patient will let me know if symptoms not improved; at that point, consider trying a different inhaler.

## 2017-08-07 NOTE — Assessment & Plan Note (Addendum)
Suspect improved however patient feels since starting jardiance, she has felt more fatigue than usual. We will stop jardiance and see if she feels this way on the trulicity.  Patient will let me know how she's doing. Follow-up in 3 months

## 2017-09-16 ENCOUNTER — Other Ambulatory Visit: Payer: Self-pay

## 2017-09-16 MED ORDER — GLUCOSE BLOOD VI STRP: ORAL_STRIP | 4 refills | Status: AC

## 2017-09-27 ENCOUNTER — Other Ambulatory Visit: Payer: Self-pay | Admitting: *Deleted

## 2017-09-27 MED ORDER — FLUTICASONE PROPIONATE 50 MCG/ACT NA SUSP: NASAL | 5 refills | Status: DC

## 2018-01-08 ENCOUNTER — Encounter: Payer: Self-pay | Admitting: Family

## 2018-01-08 ENCOUNTER — Ambulatory Visit: Payer: BC Managed Care – PPO | Admitting: Family

## 2018-01-08 VITALS — BP 118/84 | HR 80 | Temp 98.3°F | Resp 15 | Wt 187.5 lb

## 2018-01-08 DIAGNOSIS — Z1239 Encounter for other screening for malignant neoplasm of breast: Secondary | ICD-10-CM

## 2018-01-08 DIAGNOSIS — I10 Essential (primary) hypertension: Secondary | ICD-10-CM

## 2018-01-08 DIAGNOSIS — E119 Type 2 diabetes mellitus without complications: Secondary | ICD-10-CM | POA: Diagnosis not present

## 2018-01-08 DIAGNOSIS — Z1231 Encounter for screening mammogram for malignant neoplasm of breast: Secondary | ICD-10-CM | POA: Diagnosis not present

## 2018-01-08 LAB — POCT GLYCOSYLATED HEMOGLOBIN (HGB A1C): Hemoglobin A1C: 6.2

## 2018-01-08 MED ORDER — LOSARTAN POTASSIUM 25 MG PO TABS: 25.0000 mg | ORAL_TABLET | Freq: Every day | ORAL | 1 refills | Status: DC

## 2018-01-08 NOTE — Assessment & Plan Note (Signed)
At goal. Adding very low dose ARB in context of DM. Will follow.

## 2018-01-08 NOTE — Progress Notes (Signed)
Subjective:    Patient ID: Jessica Houston, female    DOB: [birth year], 52 y.o.   MRN: [id number]  CC: Jessica Houston is a 52 y.o. female who presents today for follow up.   HPI: DM- started trulicity at last visit. Checks FBG , averages 110. Would like to come off metformin so not taking so many medications. Denies polydipsia, polyphagia.   HLD- takes crestor twice per week due to myalgia.   HTN- compliant with bystolic. No longer on HCTZ due to dizziness. At home, 126/67. Started on bystolic from cardiology ( unable to find cardiology records) for palpitations. Denies exertional chest pain or pressure, numbness or tingling radiating to left arm or jaw, palpitations, dizziness, frequent headaches, changes in vision, or shortness of breath.  Unable to take lisinopril due to cough.   Due for mammogram     HISTORY:  Past Medical History:  Diagnosis Date  . Anxiety   . Asthma   . Bronchitis   . Cancer ([place])    skin  . Diabetes mellitus    HgbA1c 6.8%, diet controlled. Optho 2010, urine microalbumin 10/2010  . Esophageal reflux    CONTROLLED ON CURRENT MEDICATION  . Family history of adverse reaction to anesthesia    dad nausea and vomiting  . Hyperlipidemia   . Hypertension   . PONV (postoperative nausea and vomiting)    with C-section  . S/P endoscopy    normal per pt. done at [facility] clinic  . Sleep apnea    On CPAP  . Vitamin D deficiency    Past Surgical History:  Procedure Laterality Date  . CESAREAN SECTION N/A   . HEMORRHOID SURGERY N/A 06/21/2015   Procedure: Internal and external hemorrhoidectomy;  Surgeon: Leonie Green, MD;  Location: [facility] ORS;  Service: General;  Laterality: N/A;   Family History  Problem Relation Age of Onset  . Hyperlipidemia Mother   . Diabetes Father   . Coronary artery disease Father        agent orange contact  . Heart disease Maternal Grandmother   . Diabetes Maternal Grandfather   . Stroke Paternal Grandmother   .  Diabetes Paternal Grandfather   . Breast cancer Neg Hx     Allergies: Ace inhibitors; Ciprofloxacin; Codeine; and Onglyza [saxagliptin] Current Outpatient Medications on File Prior to Visit  Medication Sig Dispense Refill  . Calcium Carbonate Antacid (TUMS PO) Take 1 tablet by mouth as needed.      . clotrimazole-betamethasone (LOTRISONE) cream apply to affected area(s) twice daily 30 g 0  . diclofenac sodium (VOLTAREN) 1 % GEL Apply 4 g topically 4 (four) times daily. 1 Tube 3  . Dulaglutide (TRULICITY) 1.01 BP/1.0CH SOPN Inject 0.75 mg into the skin once a week. 1 pen 6  . fluticasone (FLONASE) 50 MCG/ACT nasal spray Use 2 sprays in each nostril once daily as needed 16 g 5  . fluticasone (FLOVENT HFA) 110 MCG/ACT inhaler Inhale 1 puff into the lungs 2 (two) times daily. 12 g 11  . glucose blood test strip Use as instructed with One Touch Delica Glucometer.  Dx: E11.9 100 each 4  . Lancets (ONETOUCH ULTRASOFT) lancets 1 each by Other route 2 (two) times daily. Dispense one touch nano lancets 150 each 4  . metFORMIN (GLUCOPHAGE) 500 MG tablet TAKE 2 TABS PO BID WITH A MEAL 60 tablet 3  . nebivolol (BYSTOLIC) 5 MG tablet TAKE 1 TABLET(5 MG) BY MOUTH DAILY 90 tablet 3  . pantoprazole (  PROTONIX) 40 MG tablet TAKE 1 TABLET(40 MG) BY MOUTH DAILY 30 tablet 9  . rosuvastatin (CRESTOR) 5 MG tablet Take 1 tablet (5 mg total) by mouth daily. 90 tablet 3  . UNABLE TO FIND One Touch Ultra Mini Test strips for patient to use with home machine. Patient is testing blood sugars at home 2 times daily. Dx code 250.00 100 each 6  . Vitamin D, Ergocalciferol, (DRISDOL) 50000 units CAPS capsule TAKE 1 CAPSULE BY MOUTH EVERY WEEK. 12 capsule 3   No current facility-administered medications on file prior to visit.     Social History   Tobacco Use  . Smoking status: Never Smoker  . Smokeless tobacco: Never Used  Substance Use Topics  . Alcohol use: Yes    Alcohol/week: 0.0 - 0.6 oz    Comment: rarely  .  Drug use: No    Review of Systems  Constitutional: Negative for chills and fever.  Respiratory: Negative for cough.   Cardiovascular: Negative for chest pain and palpitations.  Gastrointestinal: Negative for nausea and vomiting.  Endocrine: Negative for polydipsia, polyphagia and polyuria.      Objective:    BP 118/84 (BP Location: Left Arm, Patient Position: Sitting, Cuff Size: Large)   Pulse 80   Temp 98.3 F (36.8 C) (Oral)   Resp 15   Wt 187 lb 8 oz (85 kg)   LMP 12/16/2017 (Approximate)   SpO2 96%   BMI 35.43 kg/m  BP Readings from Last 3 Encounters:  01/08/18 118/84  08/06/17 136/64  04/24/17 126/84   Wt Readings from Last 3 Encounters:  01/08/18 187 lb 8 oz (85 kg)  08/06/17 183 lb 6.4 oz (83.2 kg)  04/24/17 189 lb (85.7 kg)    Physical Exam  Constitutional: She appears well-developed and well-nourished.  Eyes: Conjunctivae are normal.  Cardiovascular: Normal rate, regular rhythm, normal heart sounds and normal pulses.  Pulmonary/Chest: Effort normal and breath sounds normal. She has no wheezes. She has no rhonchi. She has no rales.  Neurological: She is alert.  Skin: Skin is warm and dry.  Psychiatric: She has a normal mood and affect. Her speech is normal and behavior is normal. Thought content normal.  Vitals reviewed.      Assessment & Plan:   Problem List Items Addressed This Visit      Cardiovascular and Mediastinum   Hypertension    At goal. Adding very low dose ARB in context of DM. Will follow.       Relevant Medications   losartan (COZAAR) 25 MG tablet   Other Relevant Orders   Basic metabolic panel     Endocrine   Diabetes mellitus type 2, controlled ([place]) - Primary    Improved., a1c 6.2% Would like to do trial of no metformin.Prior a1c 7.3 . Discussed protecting kidneys, will trial ARB however if she becomes hypotensive, she will let me know and we will stop.       Relevant Medications   losartan (COZAAR) 25 MG tablet   Other  Relevant Orders   POCT glycosylated hemoglobin (Hb A1C) (Completed)   Basic metabolic panel   Basic metabolic panel     Other   Routine general medical examination at a health care facility       I am having Jessica Houston start on losartan. I am also having her maintain her Calcium Carbonate Antacid (TUMS PO), UNABLE TO FIND, Vitamin D (Ergocalciferol), clotrimazole-betamethasone, onetouch ultrasoft, rosuvastatin, diclofenac sodium, pantoprazole, nebivolol, fluticasone, metFORMIN, Dulaglutide, glucose  blood, and fluticasone.   Meds ordered this encounter  Medications  . losartan (COZAAR) 25 MG tablet    Sig: Take 1 tablet (25 mg total) by mouth daily.    Dispense:  90 tablet    Refill:  1    Order Specific Question:   Supervising Provider    Answer:   Crecencio Mc [[id number]]    Return precautions given.   Risks, benefits, and alternatives of the medications and treatment plan prescribed today were discussed, and patient expressed understanding.   Education regarding symptom management and diagnosis given to patient on AVS.  Continue to follow with Burnard Hawthorne, FNP for routine health maintenance.   Jessica Houston and I agreed with plan.   Mable Paris, FNP

## 2018-01-08 NOTE — Patient Instructions (Addendum)
Follow up with cardiology regarding coming bystolic since I cannot see records to see if you can come off of medication  Start very low dose of losartan to protect kidneys since you are diabetic. Monitor blood pressure.   Come back 5 days ( approx) after starting new medication for NON fasting labs to check electrolytes and kidney function.   We placed a referral for mammogram this year. I asked that you call one the below locations and schedule this when it is convenient for you.   As discussed, I would like you to ask for 3D mammogram over the traditional 2D mammogram as new evidence suggest 3D is superior.   Please note that NOT all insurance companies cover 3D and you may have to pay a higher copay. You may call your insurance company to further clarify your benefits.   Options for [place]  [place], [place]  * [facility] [place], [place] * Note if you ask for 3D mammogram at this location, you must request [place], [place] location*

## 2018-01-08 NOTE — Assessment & Plan Note (Addendum)
Improved., a1c 6.2% Would like to do trial of no metformin.Prior a1c 7.3 . Discussed protecting kidneys, will trial ARB however if she becomes hypotensive, she will let me know and we will stop.

## 2018-01-16 ENCOUNTER — Ambulatory Visit: Payer: Self-pay | Admitting: *Deleted

## 2018-01-16 ENCOUNTER — Other Ambulatory Visit (INDEPENDENT_AMBULATORY_CARE_PROVIDER_SITE_OTHER): Payer: BC Managed Care – PPO

## 2018-01-16 DIAGNOSIS — I10 Essential (primary) hypertension: Secondary | ICD-10-CM

## 2018-01-16 DIAGNOSIS — E119 Type 2 diabetes mellitus without complications: Secondary | ICD-10-CM | POA: Diagnosis not present

## 2018-01-16 LAB — BASIC METABOLIC PANEL
BUN: 13 mg/dL (ref 6–23)
CHLORIDE: 101 meq/L (ref 96–112)
CO2: 26 meq/L (ref 19–32)
CREATININE: 0.79 mg/dL (ref 0.40–1.20)
Calcium: 9.6 mg/dL (ref 8.4–10.5)
GFR: 81.24 mL/min (ref >60.00)
Glucose, Bld: 126 mg/dL — ABNORMAL HIGH (ref 70–99)
Potassium: 3.8 mEq/L (ref 3.5–5.1)
Sodium: 135 mEq/L (ref 135–145)

## 2018-01-16 NOTE — Telephone Encounter (Signed)
Reason for Disposition . Question about upcoming scheduled test, no triage required and triager able to answer question  Answer Assessment - Initial Assessment Questions 1. REASON FOR CALL or QUESTION: "What is your reason for calling today?" or "How can I best help you?" or "What question do you have that I can help answer?"     Pt asking if Lab appt today is a blood draw or urine sample. Pt concerned because she has started menstrual cycle. Pt stated OK to leave VM. Message left for patient; blood draw only.  Protocols used: INFORMATION ONLY CALL-A-AH   Reason for Disposition . Question about upcoming scheduled test, no triage required and triager able to answer question  Answer Assessment - Initial Assessment Questions 1. REASON FOR CALL or QUESTION: "What is your reason for calling today?" or "How can I best help you?" or "What question do you have that I can help answer?"     Pt asking if Lab appt today is a blood draw or urine sample. Pt concerned because she has started menstrual cycle. Pt stated OK to leave VM. Message left for patient; blood draw only.  Protocols used: INFORMATION ONLY CALL-A-AH

## 2018-01-21 ENCOUNTER — Telehealth: Payer: Self-pay | Admitting: Family

## 2018-01-21 NOTE — Telephone Encounter (Signed)
[facility]   In reviewing your chart, it appears your are due for annual mammogram. You may already have scheduled- if so, you may disregard this letter.   Please let us know if you would like for me to order. You may call the office to let us know, and we will order for you.   Once the order in the system, you may schedule at your preferred location.   Typically women have been using one of the sites below however you may go where you have been in the past. I would ensure that when you do get a mammogram that it is 3D ( as opposed to 2D which was prior technology). Evidence suggests that 3D is superior.   Please note that NOT all insurance companies cover 3D, and you may have to pay a higher copay. You may call your insurance company to further clarify your benefits.   Options for Mammogram in [place]:    [facility]  [place], [place]   [facility] [place], [place]   Let us know if you have questions.   My best,   Mable Paris, NP

## 2018-01-24 NOTE — Telephone Encounter (Signed)
Mychart message sent.

## 2018-02-23 ENCOUNTER — Other Ambulatory Visit: Payer: Self-pay | Admitting: Family

## 2018-03-17 ENCOUNTER — Other Ambulatory Visit: Payer: Self-pay | Admitting: Family

## 2018-03-17 DIAGNOSIS — E119 Type 2 diabetes mellitus without complications: Secondary | ICD-10-CM

## 2018-03-23 ENCOUNTER — Other Ambulatory Visit: Payer: Self-pay | Admitting: Family

## 2018-05-16 ENCOUNTER — Encounter

## 2018-05-16 ENCOUNTER — Ambulatory Visit: Payer: BC Managed Care – PPO | Admitting: Family

## 2018-05-16 ENCOUNTER — Encounter: Payer: Self-pay | Admitting: Family

## 2018-05-16 VITALS — BP 150/80 | HR 84 | Temp 98.3°F | Wt 187.2 lb

## 2018-05-16 DIAGNOSIS — K219 Gastro-esophageal reflux disease without esophagitis: Secondary | ICD-10-CM | POA: Diagnosis not present

## 2018-05-16 DIAGNOSIS — I10 Essential (primary) hypertension: Secondary | ICD-10-CM | POA: Diagnosis not present

## 2018-05-16 DIAGNOSIS — E119 Type 2 diabetes mellitus without complications: Secondary | ICD-10-CM | POA: Diagnosis not present

## 2018-05-16 DIAGNOSIS — N644 Mastodynia: Secondary | ICD-10-CM | POA: Diagnosis not present

## 2018-05-16 NOTE — Assessment & Plan Note (Addendum)
Elevated today.  came down as patient rested in room.  Reassured by EKG.  No acute ischemia.  When compared to prior EKG 2016, no significant changes.  Patient is seeing Dr. Rockey Situ 7/15, advised to keep this appointment.  Reassured by normal neurologic exam in the absence of any signs or symptoms of hypertensive urgency or emergency at this time.  Patient will do a trial start back losartan to see if she experiences any side effects and she will let me know.  Pending BMP next week. She will also keep blood pressure log and let me know her blood pressures are running.

## 2018-05-16 NOTE — Progress Notes (Signed)
Subjective:    Patient ID: Jessica Houston, female    DOB: 09-Jun-1966, 52 y.o.   MRN: [id number]  CC: Jessica Houston is a 52 y.o. female who presents today for follow up.   HPI: CC: left sided breast pain, for several months. Comes and goes.  No pain today. Feels like anxiety is contributory.  Not affected by movement or activity. No associated exertional chest pain. No nipple discharge or mass appreciated. Doesn't recall injury. Mammogram due.   HTN- Husband made her anxious prior to coming in.Stopped taking losartan as felt dizzy, which has since resolved.    Denies exertional chest pain or pressure, numbness or tingling radiating to left arm or jaw, palpitations,frequent headaches, changes in vision, or shortness of breath.   Appt with cardiology - [place] 06/02/18. Made this appointment on her own as felt time for cardiac evaluation.   DM- FBG 130s. Compliant with medications. Metformin 500mg  qd  GERD- controlled on protonix. Not epigastric burning, belching, abdominal pain.           HISTORY:  Past Medical History:  Diagnosis Date  . Anxiety   . Asthma   . Bronchitis   . Cancer ([place])    skin  . Diabetes mellitus    HgbA1c 6.8%, diet controlled. Optho 2010, urine microalbumin 10/2010  . Esophageal reflux    CONTROLLED ON CURRENT MEDICATION  . Family history of adverse reaction to anesthesia    dad nausea and vomiting  . Hyperlipidemia   . Hypertension   . PONV (postoperative nausea and vomiting)    with C-section  . S/P endoscopy    normal per pt. done at [facility] clinic  . Sleep apnea    On CPAP  . Vitamin D deficiency    Past Surgical History:  Procedure Laterality Date  . CESAREAN SECTION N/A   . HEMORRHOID SURGERY N/A 06/21/2015   Procedure: Internal and external hemorrhoidectomy;  Surgeon: Leonie Green, MD;  Location: [facility] ORS;  Service: General;  Laterality: N/A;   Family History  Problem Relation Age of Onset  . Hyperlipidemia Mother   .  Diabetes Father   . Coronary artery disease Father        agent orange contact  . Heart disease Maternal Grandmother   . Diabetes Maternal Grandfather   . Stroke Paternal Grandmother   . Diabetes Paternal Grandfather   . Breast cancer Neg Hx     Allergies: Ace inhibitors; Ciprofloxacin; Codeine; and Onglyza [saxagliptin] Current Outpatient Medications on File Prior to Visit  Medication Sig Dispense Refill  . Calcium Carbonate Antacid (TUMS PO) Take 1 tablet by mouth as needed.      . clotrimazole-betamethasone (LOTRISONE) cream apply to affected area(s) twice daily 30 g 0  . diclofenac sodium (VOLTAREN) 1 % GEL Apply 4 g topically 4 (four) times daily. 1 Tube 3  . fluticasone (FLONASE) 50 MCG/ACT nasal spray Use 2 sprays in each nostril once daily as needed 16 g 5  . fluticasone (FLOVENT HFA) 110 MCG/ACT inhaler Inhale 1 puff into the lungs 2 (two) times daily. 12 g 11  . glucose blood test strip Use as instructed with One Touch Delica Glucometer.  Dx: E11.9 100 each 4  . Lancets (ONETOUCH ULTRASOFT) lancets 1 each by Other route 2 (two) times daily. Dispense one touch nano lancets 150 each 4  . metFORMIN (GLUCOPHAGE) 500 MG tablet TAKE 2 TABS PO BID WITH A MEAL (Patient taking differently: TAKE 2 TABS PO BID WITH  A MEAL) 60 tablet 3  . nebivolol (BYSTOLIC) 5 MG tablet TAKE 1 TABLET(5 MG) BY MOUTH DAILY 90 tablet 3  . pantoprazole (PROTONIX) 40 MG tablet TAKE 1 TABLET(40 MG) BY MOUTH DAILY 30 tablet 0  . pantoprazole (PROTONIX) 40 MG tablet TAKE 1 TABLET(40 MG) BY MOUTH DAILY 30 tablet 6  . rosuvastatin (CRESTOR) 5 MG tablet Take 1 tablet (5 mg total) by mouth daily. 90 tablet 3  . TRULICITY 4.19 FX/9.0WI SOPN INJECT 0.75MG  INTO THE SKIN ONCE A WEEK. 2 mL 6  . UNABLE TO FIND One Touch Ultra Mini Test strips for patient to use with home machine. Patient is testing blood sugars at home 2 times daily. Dx code 250.00 100 each 6  . Vitamin D, Ergocalciferol, (DRISDOL) 50000 units CAPS capsule  TAKE 1 CAPSULE BY MOUTH EVERY WEEK. 12 capsule 3  . losartan (COZAAR) 25 MG tablet Take 1 tablet (25 mg total) by mouth daily. (Patient not taking: Reported on 05/16/2018) 90 tablet 1   No current facility-administered medications on file prior to visit.     Social History   Tobacco Use  . Smoking status: Never Smoker  . Smokeless tobacco: Never Used  Substance Use Topics  . Alcohol use: Yes    Alcohol/week: 0.0 - 0.6 oz    Comment: rarely  . Drug use: No    Review of Systems  Constitutional: Negative for chills and fever.  Respiratory: Negative for cough and shortness of breath.   Cardiovascular: Negative for chest pain, palpitations and leg swelling.  Gastrointestinal: Negative for nausea and vomiting.  Neurological: Negative for dizziness (resolved), syncope and headaches.      Objective:    BP (!) 150/80 (BP Location: Left Arm, Patient Position: Sitting, Cuff Size: Large)   Pulse 84   Temp 98.3 F (36.8 C) (Oral)   Wt 187 lb 4 oz (84.9 kg)   SpO2 99%   BMI 35.38 kg/m  BP Readings from Last 3 Encounters:  05/16/18 (!) 150/80  01/08/18 118/84  08/06/17 136/64   Wt Readings from Last 3 Encounters:  05/16/18 187 lb 4 oz (84.9 kg)  01/08/18 187 lb 8 oz (85 kg)  08/06/17 183 lb 6.4 oz (83.2 kg)    Physical Exam  Constitutional: She appears well-developed and well-nourished.  HENT:  Mouth/Throat: Uvula is midline, oropharynx is clear and moist and mucous membranes are normal.  Eyes: Pupils are equal, round, and reactive to light. Conjunctivae and EOM are normal.  Fundus normal bilaterally.   Cardiovascular: Normal rate, regular rhythm, normal heart sounds and normal pulses.  Pulmonary/Chest: Effort normal and breath sounds normal. She has no wheezes. She has no rhonchi. She has no rales. She exhibits no mass, no tenderness, no edema and no swelling. Right breast exhibits no inverted nipple, no mass, no nipple discharge, no skin change and no tenderness. Left breast  exhibits tenderness. Left breast exhibits no inverted nipple, no mass, no nipple discharge and no skin change. Breasts are symmetrical.  Generalized tenderness of left breast. NO mass, increased warmth, erythema appreciated.   Neurological: She is alert. She has normal strength. No cranial nerve deficit or sensory deficit. She displays a negative Romberg sign.  Reflex Scores:      Bicep reflexes are 2+ on the right side and 2+ on the left side.      Patellar reflexes are 2+ on the right side and 2+ on the left side. Grip equal and strong bilateral upper extremities. Gait strong and  steady. Able to perform rapid alternating movement without difficulty.   Skin: Skin is warm and dry.  Psychiatric: She has a normal mood and affect. Her speech is normal and behavior is normal. Thought content normal.  Vitals reviewed.      Assessment & Plan:   Problem List Items Addressed This Visit      Cardiovascular and Mediastinum   Hypertension    Elevated today.  came down as patient rested in room.  Reassured by EKG.  No acute ischemia.  When compared to prior EKG 2016, no significant changes.  Patient is seeing Dr. Rockey Situ 7/15, advised to keep this appointment.  Reassured by normal neurologic exam in the absence of any signs or symptoms of hypertensive urgency or emergency at this time.  Patient will do a trial start back losartan to see if she experiences any side effects and she will let me know.  Pending BMP next week. She will also keep blood pressure log and let me know her blood pressures are running.        Relevant Orders   Basic metabolic panel   Microalbumin / creatinine urine ratio     Digestive   GERD (gastroesophageal reflux disease)    Controlled. Continue for now.         Endocrine   Diabetes mellitus type 2, controlled ([place])    Suspect controlled. Pending a1c      Relevant Orders   Hemoglobin A1c   Microalbumin / creatinine urine ratio     Other   Breast pain, left -  Primary    Breast pain is reproducible on exam which is reassuring  In terms of cardiac etiology  .  Pending diagnostic imaging.        Relevant Orders   MM DIAG BREAST TOMO UNI LEFT   US BREAST LTD UNI LEFT INC AXILLA   MM 3D SCREEN BREAST UNI RIGHT   EKG 12-Lead (Completed)       I am having Lenard Lance maintain her Calcium Carbonate Antacid (TUMS PO), UNABLE TO FIND, Vitamin D (Ergocalciferol), clotrimazole-betamethasone, onetouch ultrasoft, rosuvastatin, diclofenac sodium, nebivolol, fluticasone, metFORMIN, glucose blood, fluticasone, losartan, pantoprazole, TRULICITY, and pantoprazole.   No orders of the defined types were placed in this encounter.   Return precautions given.   Risks, benefits, and alternatives of the medications and treatment plan prescribed today were discussed, and patient expressed understanding.   Education regarding symptom management and diagnosis given to patient on AVS.  Continue to follow with Burnard Hawthorne, FNP for routine health maintenance.   Lenard Lance and I agreed with plan.   Mable Paris, FNP

## 2018-05-16 NOTE — Patient Instructions (Addendum)
Please schedule your diagnostic mammogram; I am concerned with your breast pain. Let me know if any  Problem  Keep appointment with Dr Rockey Situ  Start back on losartan.   Let me know how blood pressures are running  Come back in 7-10 days for lab appointment since you are restarting your losartan; we can do a1c, urine then

## 2018-05-16 NOTE — Assessment & Plan Note (Addendum)
Breast pain is reproducible on exam which is reassuring  In terms of cardiac etiology  .  Pending diagnostic imaging.

## 2018-05-16 NOTE — Assessment & Plan Note (Signed)
Controlled. Continue for now.

## 2018-05-18 NOTE — Assessment & Plan Note (Signed)
Suspect controlled. Pending a1c 

## 2018-05-19 ENCOUNTER — Other Ambulatory Visit: Payer: Self-pay | Admitting: Family

## 2018-05-19 DIAGNOSIS — N644 Mastodynia: Secondary | ICD-10-CM

## 2018-05-23 ENCOUNTER — Other Ambulatory Visit (INDEPENDENT_AMBULATORY_CARE_PROVIDER_SITE_OTHER): Payer: BC Managed Care – PPO

## 2018-05-23 DIAGNOSIS — E119 Type 2 diabetes mellitus without complications: Secondary | ICD-10-CM

## 2018-05-23 DIAGNOSIS — I10 Essential (primary) hypertension: Secondary | ICD-10-CM

## 2018-05-23 LAB — BASIC METABOLIC PANEL
BUN: 13 mg/dL (ref 6–23)
CO2: 28 mEq/L (ref 19–32)
CREATININE: 0.83 mg/dL (ref 0.40–1.20)
Calcium: 9.3 mg/dL (ref 8.4–10.5)
Chloride: 100 mEq/L (ref 96–112)
GFR: 76.63 mL/min (ref >60.00)
Glucose, Bld: 143 mg/dL — ABNORMAL HIGH (ref 70–99)
POTASSIUM: 4.3 meq/L (ref 3.5–5.1)
Sodium: 136 mEq/L (ref 135–145)

## 2018-05-23 LAB — MICROALBUMIN / CREATININE URINE RATIO
Creatinine,U: 20.4 mg/dL
MICROALB/CREAT RATIO: 3.4 mg/g (ref 0.0–30.0)
Microalb, Ur: <0.7 mg/dL (ref 0.0–1.9)

## 2018-05-23 LAB — HEMOGLOBIN A1C: Hgb A1c MFr Bld: 6.8 % — ABNORMAL HIGH (ref 4.6–6.5)

## 2018-05-29 ENCOUNTER — Encounter (HOSPITAL_COMMUNITY): Payer: Self-pay

## 2018-05-29 ENCOUNTER — Ambulatory Visit
Admission: RE | Admit: 2018-05-29 | Discharge: 2018-05-29 | Disposition: A | Discharge status: Home / Self Care | Payer: BC Managed Care – PPO | Source: Ambulatory Visit | Attending: Family | Admitting: Family

## 2018-05-29 DIAGNOSIS — N644 Mastodynia: Secondary | ICD-10-CM | POA: Diagnosis present

## 2018-05-31 NOTE — Progress Notes (Signed)
Cardiology Office Note  Date:  06/02/2018   ID:  Jessica Houston, DOB December 05, 1965, MRN [id number]  PCP:  Burnard Hawthorne, FNP   Chief Complaint  Patient presents with  . Other    Self ref for family history of CAD with dad having CABG at age 52. Meds reviewed by the pt. verbally. Pt. c/o shortness of breath but not sure if asthma or not.     HPI:  52 year old woman with past medical history of Diabetes obesity GERD anxiety HTN asthma OSA, not on CPAP, felt worse, more tired Who presents by referral from [place] arnette for consultation of her chest pain and discussion of risk factors  Prior hx of tachycardia Tried atenolol,  had side effects and change to bystolic  468 to 032/12 to 70 Blood pressure markedly elevated on today's visit even on recheck Feels like bystolic causing weight gain Denies having symptoms of tachycardia on the low-dose bystolic  Atypical left arm pain, at rest Feels his carpal tunnel  concerned about family history of coronary disease  Labs reviewed HBA1C 6.8 Up from 6.2 (but down from 7.2)  Previous echo at [facility] Normal study 2014, reviewed   EKG personally reviewed by myself on todays visit Shows normal sinus rhythm rate 70 bpm no significant ST or T-wave changes   PMH:   has a past medical history of Anxiety, Asthma, Bronchitis, Cancer ([place]), Diabetes mellitus, Esophageal reflux, Family history of adverse reaction to anesthesia, Hyperlipidemia, Hypertension, PONV (postoperative nausea and vomiting), S/P endoscopy, Sleep apnea, and Vitamin D deficiency.  PSH:    Past Surgical History:  Procedure Laterality Date  . CESAREAN SECTION N/A   . HEMORRHOID SURGERY N/A 06/21/2015   Procedure: Internal and external hemorrhoidectomy;  Surgeon: Leonie Green, MD;  Location: [facility] ORS;  Service: General;  Laterality: N/A;    Current Outpatient Medications  Medication Sig Dispense Refill  . Calcium Carbonate Antacid (TUMS PO) Take 1  tablet by mouth as needed.      . clotrimazole-betamethasone (LOTRISONE) cream apply to affected area(s) twice daily 30 g 0  . diclofenac sodium (VOLTAREN) 1 % GEL Apply 4 g topically 4 (four) times daily. 1 Tube 3  . fluticasone (FLONASE) 50 MCG/ACT nasal spray Use 2 sprays in each nostril once daily as needed 16 g 5  . fluticasone (FLOVENT HFA) 110 MCG/ACT inhaler Inhale 1 puff into the lungs 2 (two) times daily. 12 g 11  . glucose blood test strip Use as instructed with One Touch Delica Glucometer.  Dx: E11.9 100 each 4  . Lancets (ONETOUCH ULTRASOFT) lancets 1 each by Other route 2 (two) times daily. Dispense one touch nano lancets 150 each 4  . losartan (COZAAR) 25 MG tablet Take 1 tablet (25 mg total) by mouth daily. 90 tablet 1  . metFORMIN (GLUCOPHAGE) 500 MG tablet TAKE 2 TABS PO BID WITH A MEAL (Patient taking differently: TAKE 2 TABS PO BID WITH A MEAL) 60 tablet 3  . nebivolol (BYSTOLIC) 5 MG tablet TAKE 1 TABLET(5 MG) BY MOUTH DAILY 90 tablet 3  . pantoprazole (PROTONIX) 40 MG tablet TAKE 1 TABLET(40 MG) BY MOUTH DAILY 30 tablet 0  . pantoprazole (PROTONIX) 40 MG tablet TAKE 1 TABLET(40 MG) BY MOUTH DAILY 30 tablet 6  . TRULICITY 2.48 GN/0.0BB SOPN INJECT 0.75MG  INTO THE SKIN ONCE A WEEK. 2 mL 6  . UNABLE TO FIND One Touch Ultra Mini Test strips for patient to use with home machine. Patient is testing blood  sugars at home 2 times daily. Dx code 250.00 100 each 6  . Vitamin D, Ergocalciferol, (DRISDOL) 50000 units CAPS capsule TAKE 1 CAPSULE BY MOUTH EVERY WEEK. 12 capsule 3   No current facility-administered medications for this visit.      Allergies:   Ace inhibitors; Ciprofloxacin; Codeine; and Onglyza [saxagliptin]   Social History:  The patient  reports that she has never smoked. She has never used smokeless tobacco. She reports that she drinks alcohol. She reports that she does not use drugs.   Family History:   family history includes Coronary artery disease in her father;  Diabetes in her father, maternal grandfather, and paternal grandfather; Heart disease in her maternal grandmother; Hyperlipidemia in her mother; Stroke in her paternal grandmother.    Review of Systems: Review of Systems  Constitutional: Negative.   Respiratory: Negative.   Cardiovascular: Positive for palpitations.       Tachycardia, left arm pain  Gastrointestinal: Negative.   Musculoskeletal: Negative.   Neurological: Negative.   Psychiatric/Behavioral: The patient is nervous/anxious.   All other systems reviewed and are negative.    PHYSICAL EXAM: VS:  BP (!) 190/90 (BP Location: Right Arm, Patient Position: Sitting, Cuff Size: Normal)   Pulse 85   Ht 5\' 2"  (1.575 m)   Wt 191 lb 8 oz (86.9 kg)   BMI 35.03 kg/m  , BMI Body mass index is 35.03 kg/m. GEN: Well nourished, well developed, in no acute distress , obese HEENT: normal  Neck: no JVD, carotid bruits, or masses Cardiac: RRR; no murmurs, rubs, or gallops,no edema  Respiratory:  clear to auscultation bilaterally, normal work of breathing GI: soft, nontender, nondistended, + BS MS: no deformity or atrophy  Skin: warm and dry, no rash Neuro:  Strength and sensation are intact Psych: euthymic mood, full affect    Recent Labs: 08/06/2017: ALT 20; TSH 1.38 05/23/2018: BUN 13; Creatinine, Ser 0.83; Potassium 4.3; Sodium 136    Lipid Panel Lab Results  Component Value Date   CHOL 193 07/25/2016   HDL 38.80 (L) 07/25/2016   LDLCALC 137 (H) 01/13/2016   TRIG 270.0 (H) 07/25/2016      Wt Readings from Last 3 Encounters:  06/02/18 191 lb 8 oz (86.9 kg)  05/16/18 187 lb 4 oz (84.9 kg)  01/08/18 187 lb 8 oz (85 kg)       ASSESSMENT AND PLAN:  Controlled type 2 diabetes mellitus without complication, without long-term current use of insulin ([place]) We have encouraged continued exercise, careful diet management in an effort to lose weight. Recommended regular walking program  Essential hypertension Increase the  losartan up from 25 to 100 mg  Daily. May only need 50 mg daily if blood pressure runs low. 100 mg dose called in and she can break this in half Hold the bystolic at her request as she feels palpitations are well controlled  She feels the beta blocker is contributing to weight gain. She can take this as needed for tachycardia or palpitations  Mixed hyperlipidemia Could consider crestor 2x per week, With adding zetia  recommended CT coronary calcium scoring for risk stratification given strong family history father with bypass surgery  Obstructive sleep apnea Not on CPAP  Anxiety Concerning health issues Long discussion concerning   Tachycardia Well controlled on beta blocker but she would like to take this only as needed  Disposition:   F/U  As needed   Total encounter time more than 60 minutes  Greater than 50% was  spent in counseling and coordination of care with the patient  Patient was seen in consultation from Mable Paris and will be referred back to her office for ongoing care of the issues detailed above  No orders of the defined types were placed in this encounter.    Signed, Esmond Plants, M.D., Ph.D. 06/02/2018  [place], [place]

## 2018-06-01 ENCOUNTER — Other Ambulatory Visit: Payer: Self-pay | Admitting: Family

## 2018-06-02 ENCOUNTER — Encounter: Payer: Self-pay | Admitting: Cardiovascular Disease

## 2018-06-02 ENCOUNTER — Encounter

## 2018-06-02 ENCOUNTER — Ambulatory Visit: Payer: BC Managed Care – PPO | Admitting: Cardiovascular Disease

## 2018-06-02 VITALS — BP 178/88 | HR 85 | Ht 62.0 in | Wt 191.5 lb

## 2018-06-02 DIAGNOSIS — E119 Type 2 diabetes mellitus without complications: Secondary | ICD-10-CM | POA: Diagnosis not present

## 2018-06-02 DIAGNOSIS — I10 Essential (primary) hypertension: Secondary | ICD-10-CM | POA: Diagnosis not present

## 2018-06-02 DIAGNOSIS — R002 Palpitations: Secondary | ICD-10-CM | POA: Diagnosis not present

## 2018-06-02 DIAGNOSIS — E782 Mixed hyperlipidemia: Secondary | ICD-10-CM

## 2018-06-02 DIAGNOSIS — F419 Anxiety disorder, unspecified: Secondary | ICD-10-CM

## 2018-06-02 DIAGNOSIS — G4733 Obstructive sleep apnea (adult) (pediatric): Secondary | ICD-10-CM | POA: Diagnosis not present

## 2018-06-02 MED ORDER — NEBIVOLOL HCL 5 MG PO TABS: ORAL_TABLET | ORAL | 3 refills | Status: AC

## 2018-06-02 MED ORDER — LOSARTAN POTASSIUM 100 MG PO TABS: 100.0000 mg | ORAL_TABLET | Freq: Every day | ORAL | 3 refills | Status: DC

## 2018-06-02 NOTE — Patient Instructions (Addendum)
Medication Instructions:   Please increase the losartan up to 50-100 mg daily Take the bystolic as needed  Samples given: Bystolic 5 mg Lot: [id number] Exp: 4/21 # 2 bottles   Labwork:  No new labs needed  Testing/Procedures:  We will order a CT coronary calcium score- $150/ out of pocket For family hx  [facility] [address]  [place], [place] [address] [phone]  Please all to schedule at your convenience  Follow-Up: It was a pleasure seeing you in the office today. Please call us if you have new issues that need to be addressed before your next appt.  [phone]  Your physician wants you to follow-up in:  As needed  If you need a refill on your cardiac medications before your next appointment, please call your pharmacy.  For educational health videos Log in to : [email or web address] Or : [email or web address], password : [email or web address]

## 2018-06-08 ENCOUNTER — Other Ambulatory Visit: Payer: Self-pay | Admitting: Family

## 2018-06-11 ENCOUNTER — Ambulatory Visit (INDEPENDENT_AMBULATORY_CARE_PROVIDER_SITE_OTHER)
Admission: RE | Admit: 2018-06-11 | Discharge: 2018-06-11 | Disposition: A | Discharge status: Home / Self Care | Payer: Self-pay | Source: Ambulatory Visit | Attending: Cardiovascular Disease | Admitting: Cardiovascular Disease

## 2018-06-11 DIAGNOSIS — I10 Essential (primary) hypertension: Secondary | ICD-10-CM

## 2018-06-11 DIAGNOSIS — E782 Mixed hyperlipidemia: Secondary | ICD-10-CM

## 2018-06-11 DIAGNOSIS — E119 Type 2 diabetes mellitus without complications: Secondary | ICD-10-CM

## 2018-06-18 ENCOUNTER — Telehealth: Payer: Self-pay | Admitting: Cardiovascular Disease

## 2018-06-18 NOTE — Telephone Encounter (Signed)
Please call to discuss CT Calcium Score.

## 2018-06-19 NOTE — Telephone Encounter (Signed)
Spoke with patient and reviewed results with her to include Dr. Donivan Scull additional review of her images. She verbalized understanding of our conversation with no further questions at this time.

## 2018-07-29 DIAGNOSIS — N939 Abnormal uterine and vaginal bleeding, unspecified: Secondary | ICD-10-CM | POA: Insufficient documentation

## 2018-07-29 DIAGNOSIS — R102 Pelvic and perineal pain: Secondary | ICD-10-CM | POA: Insufficient documentation

## 2018-07-30 ENCOUNTER — Ambulatory Visit (INDEPENDENT_AMBULATORY_CARE_PROVIDER_SITE_OTHER): Payer: BC Managed Care – PPO

## 2018-07-30 ENCOUNTER — Encounter: Payer: Self-pay | Admitting: Podiatry

## 2018-07-30 ENCOUNTER — Ambulatory Visit: Payer: BC Managed Care – PPO | Admitting: Podiatry

## 2018-07-30 VITALS — BP 131/80 | HR 91 | Resp 16

## 2018-07-30 DIAGNOSIS — S9031XA Contusion of right foot, initial encounter: Secondary | ICD-10-CM | POA: Diagnosis not present
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
## 2018-08-18 ENCOUNTER — Encounter: Payer: Self-pay | Admitting: Family

## 2018-08-18 ENCOUNTER — Ambulatory Visit: Payer: BC Managed Care – PPO | Admitting: Family

## 2018-08-18 VITALS — BP 140/70 | HR 80 | Temp 98.1°F | Resp 16 | Ht 62.0 in | Wt 187.4 lb

## 2018-08-18 DIAGNOSIS — E782 Mixed hyperlipidemia: Secondary | ICD-10-CM

## 2018-08-18 DIAGNOSIS — E559 Vitamin D deficiency, unspecified: Secondary | ICD-10-CM

## 2018-08-18 DIAGNOSIS — R350 Frequency of micturition: Secondary | ICD-10-CM | POA: Diagnosis not present

## 2018-08-18 DIAGNOSIS — I10 Essential (primary) hypertension: Secondary | ICD-10-CM | POA: Diagnosis not present

## 2018-08-18 DIAGNOSIS — J45909 Unspecified asthma, uncomplicated: Secondary | ICD-10-CM

## 2018-08-18 DIAGNOSIS — Z23 Encounter for immunization: Secondary | ICD-10-CM

## 2018-08-18 LAB — POC URINALSYSI DIPSTICK (AUTOMATED)
Bilirubin, UA: NEGATIVE
GLUCOSE UA: NEGATIVE
Ketones, UA: NEGATIVE
Leukocytes, UA: NEGATIVE
NITRITE UA: NEGATIVE
PROTEIN UA: NEGATIVE
RBC UA: NEGATIVE
Spec Grav, UA: 1.01 (ref 1.010–1.025)
UROBILINOGEN UA: 0.2 U/dL
pH, UA: 8 (ref 5.0–8.0)

## 2018-08-18 NOTE — Assessment & Plan Note (Signed)
Slightly elevated today.  Advised patient keep a BP log at home and call me with those numbers so we can titrate medications.

## 2018-08-18 NOTE — Assessment & Plan Note (Signed)
No medication therapy at this time.  Pending lipid panel.

## 2018-08-18 NOTE — Assessment & Plan Note (Signed)
Of late has had cough in the morning.  Benign exam, no acute respiratory distress.  She has been using Flovent once a day as this Inhaler is also rather expensive for her.  She will check her formulary and let me know what inhalers are on the list.  I advised if Flovent is on the list, that she take it twice daily as prescribed for several weeks and see if her symptoms improve otherwise, we could discuss increasing the dosage of the medication.  She will also try taking Flonase more regularly.  Also advised her to discontinue 1 of her antihistamines, she was on both Allegra, Zyrtec which is with duplicative.  Patient verbalized understanding.

## 2018-08-18 NOTE — Assessment & Plan Note (Signed)
Patient is afebrile, nontoxic in appearance.  Urine dipstick is negative for blood, nitrites, leukocytes.  Pending urine culture at this time which patient was comfortable with

## 2018-08-18 NOTE — Patient Instructions (Addendum)
Stop either allegra OR zyrtec since they are in the same drug class.  Trial DAILY flonase.   Send me a mychart with inhalers on your formulary. See if Advair or Symbicort are there. What is cheaper than Flovent?    Monitor blood pressure,  Goal is less than 130/80; if persistently higher, please make sooner follow up appointment so we can recheck you blood pressure and manage medications   This is  Dr. Lupita Dawn  example of a  "Low GI"  Diet:  It will allow you to lose 4 to 8  lbs  per month if you follow it carefully.  Your goal with exercise is a minimum of 30 minutes of aerobic exercise 5 days per week (Walking does not count once it becomes easy!)    All of the foods can be found at grocery stores and in bulk at [employer].  The Atkins protein bars and shakes are available in more varieties at [employer], [employer] and [place].     7 AM Breakfast:  Choose from the following:  Low carbohydrate Protein  Shakes (I recommend the  Premier Protein chocolate shakes,  EAS AdvantEdge "Carb Control" shakes  Or the Atkins shakes all are under 3 net carbs)     a scrambled egg/bacon/cheese burrito made with Mission's "carb balance" whole wheat tortilla  (about 10 net carbs )  Regulatory affairs officer (basically a quiche without the pastry crust) that is eaten cold and very convenient way to get your eggs.  8 carbs)  If you make your own protein shakes, avoid bananas and pineapple,  And use low carb greek yogurt or original /unsweetened almond or soy milk    Avoid cereal and bananas, oatmeal and cream of wheat and grits. They are loaded with carbohydrates!   10 AM: high protein snack:  Protein bar by Atkins (the snack size, under 200 cal, usually < 6 net carbs).    A stick of cheese:  Around 1 carb,  100 cal     Dannon Light n Fit [place] Yogurt  (80 cal, 8 carbs)  Other so called "protein bars" and Greek yogurts tend to be loaded with carbohydrates.  Remember, in food advertising, the  word "energy" is synonymous for " carbohydrate."  Lunch:   A Sandwich using the bread choices listed, Can use any  Eggs,  lunchmeat, grilled meat or canned tuna), avocado, regular mayo/mustard  and cheese.  A Salad using blue cheese, ranch,  Goddess or vinagrette,  Avoid taco shells, croutons or "confetti" and no "candied nuts" but regular nuts OK.   No pretzels, nabs  or chips.  Pickles and miniature sweet peppers are a good low carb alternative that provide a "crunch"  The bread is the only source of carbohydrate in a sandwich and  can be decreased by trying some of the attached alternatives to traditional loaf bread   Avoid "Low fat dressings, as well as [place] dressings They are loaded with sugar!   3 PM/ Mid day  Snack:  Consider  1 ounce of  almonds, walnuts, pistachios, pecans, peanuts,  Macadamia nuts or a nut medley.  Avoid "granola and granola bars "  Mixed nuts are ok in moderation as long as there are no raisins,  cranberries or dried fruit.   KIND bars are OK if you get the low glycemic index variety   Try the prosciutto/mozzarella cheese sticks by [employer]  In deli /backery section   High  protein      6 PM  Dinner:     Meat/fowl/fish with a green salad, and either broccoli, cauliflower, green beans, spinach, brussel sprouts or  Lima beans. DO NOT BREAD THE PROTEIN!!      There is a low carb pasta by Dreamfield's that is acceptable and tastes great: only 5 digestible carbs/serving.( All grocery stores but BJs carry it ) Several ready made meals are available low carb:   Try Michel Angelo's chicken piccata or chicken or eggplant parm over low carb pasta.(Lowes and BJs)   Marjory Lies Sanchez's "Carnitas" (pulled pork, no sauce,  0 carbs) or his beef pot roast to make a dinner burrito (at [employer]J's)  Pesto over low carb pasta (bj's sells a good quality pesto in the center refrigerated section of the deli   Try satueeing  Cheral Marker with mushroooms as a good side   Green  Giant makes a mashed cauliflower that tastes like mashed potatoes  Whole wheat pasta is still full of digestible carbs and  Not as low in glycemic index as Dreamfield's.   Brown rice is still rice,  So skip the rice and noodles if you eat [place] or [place] (or at least limit to 1/2 cup)  9 PM snack :   Breyer's "low carb" fudgsicle or  ice cream bar (Carb Smart line), or  Weight Watcher's ice cream bar , or another "no sugar added" ice cream;  a serving of fresh berries/cherries with whipped cream   Cheese or DANNON'S LlGHT N FIT GREEK YOGURT  8 ounces of [place] unsweetened almond/cococunut milk    Treat yourself to a parfait made with whipped cream blueberiies, walnuts and vanilla greek yogurt  Avoid bananas, pineapple, grapes  and watermelon on a regular basis because they are high in sugar.  THINK OF THEM AS DESSERT  Remember that snack Substitutions should be less than 10 NET carbs per serving and meals < 20 carbs. Remember to subtract fiber grams to get the "net carbs."  @TULLOBREADPACKAGE @

## 2018-08-18 NOTE — Progress Notes (Signed)
Subjective:    Patient ID: Jessica Houston, female    DOB: 29-Aug-1966, 52 y.o.   MRN: [id number]  CC: Jessica Houston is a 52 y.o. female who presents today for follow up.   HPI: HTN- back on losartan 50mg . Denies exertional chest pain or pressure, numbness or tingling radiating to left arm or jaw, dizziness, frequent headaches, changes in vision, or shortness of breath.  Palpitations- chronic, unchanged. Worsened by stress. takes bystolic prn. None today.    HLD- Per patient advised, no cholesterol medication based NM study by Dr. Rockey Situ    Asthma- Has dry cough, just in the morning.  would like albuterol inhaler as had in the past. Would like refill. ON allegra, zyrtec, flonase. Not using flovent but once per day due to cost. No ear pain, sore throat.   Complains of urinary frequency , intermittent. No hematuria, low back pain.   Notes following keto diet , following low carb. No exercise.     Gollan- losartan 100mg  qd, bystolic prn for palpitations.   Mammogram UTD- reviewed 05/29/18. To repeat 05/2019 HISTORY:  Past Medical History:  Diagnosis Date  . Anxiety   . Asthma   . Bronchitis   . Cancer ([place])    skin  . Diabetes mellitus    HgbA1c 6.8%, diet controlled. Optho 2010, urine microalbumin 10/2010  . Esophageal reflux    CONTROLLED ON CURRENT MEDICATION  . Family history of adverse reaction to anesthesia    dad nausea and vomiting  . Hyperlipidemia   . Hypertension   . PONV (postoperative nausea and vomiting)    with C-section  . S/P endoscopy    normal per pt. done at [facility] clinic  . Sleep apnea    On CPAP  . Vitamin D deficiency    Past Surgical History:  Procedure Laterality Date  . CESAREAN SECTION N/A   . HEMORRHOID SURGERY N/A 06/21/2015   Procedure: Internal and external hemorrhoidectomy;  Surgeon: Leonie Green, MD;  Location: [facility] ORS;  Service: General;  Laterality: N/A;   Family History  Problem Relation Age of Onset  . Hyperlipidemia  Mother   . Diabetes Father   . Coronary artery disease Father        agent orange contact  . Heart disease Maternal Grandmother   . Diabetes Maternal Grandfather   . Stroke Paternal Grandmother   . Diabetes Paternal Grandfather   . Breast cancer Neg Hx     Allergies: Ace inhibitors; Ciprofloxacin; Codeine; and Onglyza [saxagliptin] Current Outpatient Medications on File Prior to Visit  Medication Sig Dispense Refill  . Calcium Carbonate Antacid (TUMS PO) Take 1 tablet by mouth as needed.      . cetirizine (ZYRTEC) 10 MG tablet Take 10 mg by mouth daily.    . clotrimazole-betamethasone (LOTRISONE) cream apply to affected area(s) twice daily 30 g 0  . diclofenac sodium (VOLTAREN) 1 % GEL Apply 4 g topically 4 (four) times daily. 1 Tube 3  . fexofenadine (ALLEGRA) 180 MG tablet Take 180 mg by mouth daily.    . fluticasone (FLONASE) 50 MCG/ACT nasal spray Use 2 sprays in each nostril once daily as needed 16 g 5  . fluticasone (FLOVENT HFA) 110 MCG/ACT inhaler Inhale 1 puff into the lungs 2 (two) times daily. 12 g 11  . glucose blood test strip Use as instructed with One Touch Delica Glucometer.  Dx: E11.9 100 each 4  . Lancets (ONETOUCH ULTRASOFT) lancets 1 each by Other route 2 (  two) times daily. Dispense one touch nano lancets 150 each 4  . losartan (COZAAR) 100 MG tablet Take 1 tablet (100 mg total) by mouth daily. 90 tablet 3  . metFORMIN (GLUCOPHAGE) 500 MG tablet Take 1 tablet (500 mg total) by mouth 2 (two) times daily. 180 tablet 0  . nebivolol (BYSTOLIC) 5 MG tablet TAKE 1 TABLET(5 MG) BY MOUTH DAILY as needed 90 tablet 3  . pantoprazole (PROTONIX) 40 MG tablet TAKE 1 TABLET(40 MG) BY MOUTH DAILY 30 tablet 6  . TRULICITY 2.77 OE/4.2PN SOPN INJECT 0.75MG  INTO THE SKIN ONCE A WEEK. 2 mL 6  . UNABLE TO FIND One Touch Ultra Mini Test strips for patient to use with home machine. Patient is testing blood sugars at home 2 times daily. Dx code 250.00 100 each 6   No current  facility-administered medications on file prior to visit.     Social History   Tobacco Use  . Smoking status: Never Smoker  . Smokeless tobacco: Never Used  Substance Use Topics  . Alcohol use: Yes    Alcohol/week: 0.0 - 1.0 standard drinks    Comment: rarely  . Drug use: No    Review of Systems  Constitutional: Negative for chills and fever.  HENT: Negative for congestion, ear pain and sore throat.   Respiratory: Positive for cough. Negative for shortness of breath and wheezing.   Cardiovascular: Negative for chest pain and palpitations.  Gastrointestinal: Negative for nausea and vomiting.  Genitourinary: Positive for frequency. Negative for difficulty urinating, dysuria and hematuria.  Musculoskeletal: Negative for back pain.      Objective:    BP 140/70   Pulse 80   Temp 98.1 F (36.7 C) (Oral)   Resp 16   Ht 5\' 2"  (1.575 m)   Wt 187 lb 6 oz (85 kg)   SpO2 97%   BMI 34.27 kg/m  BP Readings from Last 3 Encounters:  08/18/18 140/70  07/30/18 131/80  06/02/18 (!) 178/88   Wt Readings from Last 3 Encounters:  08/18/18 187 lb 6 oz (85 kg)  06/02/18 191 lb 8 oz (86.9 kg)  05/16/18 187 lb 4 oz (84.9 kg)    Physical Exam  Constitutional: She appears well-developed and well-nourished.  HENT:  Head: Normocephalic and atraumatic.  Right Ear: Hearing, tympanic membrane, external ear and ear canal normal. No drainage, swelling or tenderness. No foreign bodies. Tympanic membrane is not erythematous and not bulging. No middle ear effusion. No decreased hearing is noted.  Left Ear: Hearing, tympanic membrane, external ear and ear canal normal. No drainage, swelling or tenderness. No foreign bodies. Tympanic membrane is not erythematous and not bulging.  No middle ear effusion. No decreased hearing is noted.  Nose: Nose normal. No rhinorrhea. Right sinus exhibits no maxillary sinus tenderness and no frontal sinus tenderness. Left sinus exhibits no maxillary sinus tenderness  and no frontal sinus tenderness.  Mouth/Throat: Uvula is midline, oropharynx is clear and moist and mucous membranes are normal. No oropharyngeal exudate, posterior oropharyngeal edema, posterior oropharyngeal erythema or tonsillar abscesses.  Eyes: Conjunctivae are normal.  Cardiovascular: Normal rate, regular rhythm, normal heart sounds and normal pulses.  Pulmonary/Chest: Effort normal and breath sounds normal. She has no wheezes. She has no rhonchi. She has no rales.  Abdominal: There is no CVA tenderness.  Lymphadenopathy:       Head (right side): No submental, no submandibular, no tonsillar, no preauricular, no posterior auricular and no occipital adenopathy present.  Head (left side): No submental, no submandibular, no tonsillar, no preauricular, no posterior auricular and no occipital adenopathy present.    She has no cervical adenopathy.  Neurological: She is alert.  Skin: Skin is warm and dry.  Psychiatric: She has a normal mood and affect. Her speech is normal and behavior is normal. Thought content normal.  Vitals reviewed.      Assessment & Plan:   Problem List Items Addressed This Visit      Cardiovascular and Mediastinum   Hypertension - Primary    Slightly elevated today.  Advised patient keep a BP log at home and call me with those numbers so we can titrate medications.      Relevant Orders   Lipid panel   Hemoglobin A1c   Comprehensive metabolic panel   VITAMIN D 25 Hydroxy (Vit-D Deficiency, Fractures)     Respiratory   Asthma, chronic    Of late has had cough in the morning.  Benign exam, no acute respiratory distress.  She has been using Flovent once a day as this Inhaler is also rather expensive for her.  She will check her formulary and let me know what inhalers are on the list.  I advised if Flovent is on the list, that she take it twice daily as prescribed for several weeks and see if her symptoms improve otherwise, we could discuss increasing the  dosage of the medication.  She will also try taking Flonase more regularly.  Also advised her to discontinue 1 of her antihistamines, she was on both Allegra, Zyrtec which is with duplicative.  Patient verbalized understanding.        Other   Urinary frequency    Patient is afebrile, nontoxic in appearance.  Urine dipstick is negative for blood, nitrites, leukocytes.  Pending urine culture at this time which patient was comfortable with      Relevant Orders   POCT Urinalysis Dipstick (Automated) (Completed)   Hyperlipidemia    No medication therapy at this time.  Pending lipid panel.      Vitamin D deficiency    Pending vitamin d       Other Visit Diagnoses    Need for immunization against influenza       Relevant Orders   Flu Vaccine QUAD 36+ mos IM (Completed)       I am having Lenard Lance maintain her Calcium Carbonate Antacid (TUMS PO), UNABLE TO FIND, clotrimazole-betamethasone, onetouch ultrasoft, diclofenac sodium, fluticasone, glucose blood, fluticasone, TRULICITY, pantoprazole, losartan, nebivolol, metFORMIN, fexofenadine, and cetirizine.   No orders of the defined types were placed in this encounter.   Return precautions given.   Risks, benefits, and alternatives of the medications and treatment plan prescribed today were discussed, and patient expressed understanding.   Education regarding symptom management and diagnosis given to patient on AVS.  Continue to follow with Burnard Hawthorne, FNP for routine health maintenance.   Lenard Lance and I agreed with plan.   Mable Paris, FNP

## 2018-08-18 NOTE — Assessment & Plan Note (Signed)
Pending vitamin d 

## 2018-08-25 ENCOUNTER — Telehealth: Payer: Self-pay

## 2018-08-25 DIAGNOSIS — R350 Frequency of micturition: Secondary | ICD-10-CM

## 2018-08-25 NOTE — Telephone Encounter (Signed)
Urine orders per [facility]

## 2018-08-26 ENCOUNTER — Encounter: Payer: Self-pay | Admitting: Family

## 2018-08-27 ENCOUNTER — Other Ambulatory Visit: Payer: Self-pay | Admitting: Family

## 2018-08-27 DIAGNOSIS — L659 Nonscarring hair loss, unspecified: Secondary | ICD-10-CM

## 2018-08-28 ENCOUNTER — Other Ambulatory Visit (INDEPENDENT_AMBULATORY_CARE_PROVIDER_SITE_OTHER): Payer: BC Managed Care – PPO

## 2018-08-28 DIAGNOSIS — I10 Essential (primary) hypertension: Secondary | ICD-10-CM | POA: Diagnosis not present

## 2018-08-28 DIAGNOSIS — L659 Nonscarring hair loss, unspecified: Secondary | ICD-10-CM | POA: Diagnosis not present

## 2018-08-28 DIAGNOSIS — R350 Frequency of micturition: Secondary | ICD-10-CM

## 2018-08-28 LAB — URINALYSIS, ROUTINE W REFLEX MICROSCOPIC
Bilirubin Urine: NEGATIVE
Ketones, ur: NEGATIVE
LEUKOCYTES UA: NEGATIVE
Nitrite: NEGATIVE
Specific Gravity, Urine: 1.02 (ref 1.000–1.030)
Total Protein, Urine: NEGATIVE
URINE GLUCOSE: NEGATIVE
Urobilinogen, UA: 0.2 (ref 0.0–1.0)
WBC, UA: NONE SEEN (ref 0–?)
pH: 5.5 (ref 5.0–8.0)

## 2018-08-28 LAB — LIPID PANEL
CHOLESTEROL: 220 mg/dL — AB (ref 0–200)
HDL: 38.9 mg/dL — AB (ref >39.00)
LDL Cholesterol: 147 mg/dL — ABNORMAL HIGH (ref 0–99)
NonHDL: 180.84
TRIGLYCERIDES: 170 mg/dL — AB (ref 0.0–149.0)
Total CHOL/HDL Ratio: 6
VLDL: 34 mg/dL (ref 0.0–40.0)

## 2018-08-28 LAB — COMPREHENSIVE METABOLIC PANEL
ALK PHOS: 50 U/L (ref 39–117)
ALT: 27 U/L (ref 0–35)
AST: 18 U/L (ref 0–37)
Albumin: 4.1 g/dL (ref 3.5–5.2)
BUN: 19 mg/dL (ref 6–23)
CALCIUM: 9.4 mg/dL (ref 8.4–10.5)
CO2: 27 mEq/L (ref 19–32)
Chloride: 101 mEq/L (ref 96–112)
Creatinine, Ser: 0.84 mg/dL (ref 0.40–1.20)
GFR: 75.5 mL/min (ref >60.00)
GLUCOSE: 115 mg/dL — AB (ref 70–99)
POTASSIUM: 3.8 meq/L (ref 3.5–5.1)
Sodium: 137 mEq/L (ref 135–145)
TOTAL PROTEIN: 7.5 g/dL (ref 6.0–8.3)
Total Bilirubin: 0.4 mg/dL (ref 0.2–1.2)

## 2018-08-28 LAB — TSH: TSH: 1.84 u[IU]/mL (ref 0.35–4.50)

## 2018-08-28 LAB — HEMOGLOBIN A1C: Hgb A1c MFr Bld: 6.6 % — ABNORMAL HIGH (ref 4.6–6.5)

## 2018-08-28 LAB — VITAMIN D 25 HYDROXY (VIT D DEFICIENCY, FRACTURES): VITD: 33.08 ng/mL (ref 30.00–100.00)

## 2018-08-29 ENCOUNTER — Other Ambulatory Visit: Payer: Self-pay | Admitting: Family

## 2018-08-29 LAB — URINE CULTURE
MICRO NUMBER: 91220124
SPECIMEN QUALITY:: ADEQUATE

## 2018-09-03 ENCOUNTER — Other Ambulatory Visit: Payer: Self-pay | Admitting: Family

## 2018-09-03 DIAGNOSIS — E782 Mixed hyperlipidemia: Secondary | ICD-10-CM

## 2018-09-03 MED ORDER — ROSUVASTATIN CALCIUM 10 MG PO TABS: 10.0000 mg | ORAL_TABLET | Freq: Every day | ORAL | 3 refills | Status: DC

## 2018-10-04 ENCOUNTER — Other Ambulatory Visit: Payer: Self-pay | Admitting: Family

## 2018-10-04 DIAGNOSIS — E119 Type 2 diabetes mellitus without complications: Secondary | ICD-10-CM

## 2018-10-06 ENCOUNTER — Other Ambulatory Visit: Payer: Self-pay | Admitting: Family

## 2018-10-29 ENCOUNTER — Other Ambulatory Visit: Payer: Self-pay | Admitting: Family

## 2018-10-29 ENCOUNTER — Encounter: Payer: Self-pay | Admitting: Family

## 2018-10-29 DIAGNOSIS — E119 Type 2 diabetes mellitus without complications: Secondary | ICD-10-CM

## 2018-10-29 MED ORDER — DULAGLUTIDE 0.75 MG/0.5ML ~~LOC~~ SOAJ: 2.0000 mL | SUBCUTANEOUS | 0 refills | Status: DC

## 2018-10-30 ENCOUNTER — Other Ambulatory Visit: Payer: Self-pay

## 2018-10-30 MED ORDER — PANTOPRAZOLE SODIUM 40 MG PO TBEC: DELAYED_RELEASE_TABLET | ORAL | 1 refills | Status: DC

## 2018-10-31 ENCOUNTER — Ambulatory Visit: Payer: Self-pay

## 2018-10-31 ENCOUNTER — Other Ambulatory Visit: Payer: Self-pay | Admitting: Family

## 2018-10-31 DIAGNOSIS — E119 Type 2 diabetes mellitus without complications: Secondary | ICD-10-CM

## 2018-10-31 MED ORDER — PANTOPRAZOLE SODIUM 40 MG PO TBEC: DELAYED_RELEASE_TABLET | ORAL | 1 refills | Status: AC

## 2018-10-31 MED ORDER — DULAGLUTIDE 0.75 MG/0.5ML ~~LOC~~ SOAJ: SUBCUTANEOUS | 3 refills | Status: DC

## 2018-10-31 MED ORDER — DULAGLUTIDE 0.75 MG/0.5ML ~~LOC~~ SOAJ: 0.7500 mg | SUBCUTANEOUS | 3 refills | Status: DC

## 2018-10-31 NOTE — Telephone Encounter (Signed)
I called & clarified with patient. She is taking the 0.75mg  weekly subcu. Patient also asked would it be possible to send in a prescription for alprazolam? She is growing through some struggles with the holidays. Patient has an OV with you 11/26/18 that she intends to keep.

## 2018-10-31 NOTE — Telephone Encounter (Signed)
How should patient be taking trulicity? The request I received from [place] has that she was taking 2 ml into the skin weekly. I changed the quantity of pens sent, but pharmacy states that script is still wrong. It should be for 0.75 mg once a week? Please advise so it Jessica Houston be resent?

## 2018-10-31 NOTE — Telephone Encounter (Signed)
Call patient   it appears that she has been on the 0.75 mg once a week dosing.  If she tells you otherwise that she is been on the 1.5 mg subcu weekly, let me know.  I went ahead and sent a new prescription to her pharmacy.    You may call the pharmacy as well to ensure all  on the same page.

## 2018-10-31 NOTE — Telephone Encounter (Signed)
[facility] [place] to verify instructions on Trulicity order sent 54/65/68. Says to inject 2 mls once a week. Usually is ordered 0.17ml. Please verify order.

## 2018-11-03 NOTE — Telephone Encounter (Signed)
I have sent her MyChart message in regards to BuSpar which she read.    I feel more comfortable with that over alprazolam.

## 2018-11-19 ENCOUNTER — Other Ambulatory Visit: Payer: Self-pay | Admitting: Family

## 2018-11-20 LAB — HM DIABETES EYE EXAM

## 2018-11-24 NOTE — Progress Notes (Signed)
Cardiology Office Note  Date:  11/25/2018   ID:  Jessica Houston, DOB [birth year], MRN [id number]  PCP:  Burnard Hawthorne, FNP   Chief Complaint  Patient presents with  . other    pain in left arm radiates down into hands and has finger numbness. Medications reviewed verbally.     HPI:  53 year old woman with past medical history of Diabetes HBA1C 6.6 obesity GERD anxiety HTN Asthma History of paroxysmal tachycardia OSA, not on CPAP, felt worse, more tired CT coronary calcium score of 21 May 2018 Who presents for follow-up of her chest pain   Prior hx of tachycardia Tried atenolol,  had side effects and change to bystolic  In follow-up today having some left shoulder left posterior neck discomfort numb and tingling down the arm Was concern for cardiac etiology -During Christmas holiday 2019 Was very stressful, lots of family activities Denies having any significant symptoms with exertion No shortness of breath, no central chest tightness, no radiation  Denies any significant tachycardia On prior office visit also had Atypical left arm pain, at rest She reported that she felt this was from carpal tunnel  CT coronary calcium score images pulled up and discussed with her in detail  Labs reviewed  hemoglobin A1c in the 6 range Total cholesterol 220 She is reluctant to start a statin  Previous echo at [facility] Normal study 2014  EKG personally reviewed by myself on todays visit Shows normal sinus rhythm with rate 92 bpm no significant ST or T wave changes   PMH:   has a past medical history of Anxiety, Asthma, Bronchitis, Cancer ([place]), Diabetes mellitus, Esophageal reflux, Family history of adverse reaction to anesthesia, Hyperlipidemia, Hypertension, PONV (postoperative nausea and vomiting), S/P endoscopy, Sleep apnea, and Vitamin D deficiency.  PSH:    Past Surgical History:  Procedure Laterality Date  . CESAREAN SECTION N/A   . HEMORRHOID SURGERY N/A 06/21/2015    Procedure: Internal and external hemorrhoidectomy;  Surgeon: Leonie Green, MD;  Location: [facility] ORS;  Service: General;  Laterality: N/A;    Current Outpatient Medications  Medication Sig Dispense Refill  . Calcium Carbonate Antacid (TUMS PO) Take 1 tablet by mouth as needed.      . cetirizine (ZYRTEC) 10 MG tablet Take 10 mg by mouth daily.    . clotrimazole-betamethasone (LOTRISONE) cream apply to affected area(s) twice daily 30 g 0  . diclofenac sodium (VOLTAREN) 1 % GEL Apply 4 g topically 4 (four) times daily. 1 Tube 3  . Dulaglutide (TRULICITY) 8.45 XM/4.6OE SOPN Inject 0.75 mg into the skin once a week. 3 pen 3  . fexofenadine (ALLEGRA) 180 MG tablet Take 180 mg by mouth daily.    Marland Kitchen FLOVENT HFA 110 MCG/ACT inhaler INHALE 1 PUFF INTO THE LUNGS TWICE DAILY 12 g 5  . fluticasone (FLONASE) 50 MCG/ACT nasal spray Use 2 sprays in each nostril once daily as needed 16 g 5  . glucose blood test strip Use as instructed with One Touch Delica Glucometer.  Dx: E11.9 100 each 4  . Lancets (ONETOUCH ULTRASOFT) lancets 1 each by Other route 2 (two) times daily. Dispense one touch nano lancets 150 each 4  . losartan (COZAAR) 100 MG tablet Take 1 tablet (100 mg total) by mouth daily. 90 tablet 3  . metFORMIN (GLUCOPHAGE) 500 MG tablet TAKE 1 TABLET(500 MG) BY MOUTH TWICE DAILY 180 tablet 0  . pantoprazole (PROTONIX) 40 MG tablet TAKE ONE TABLET BY MOUTH DAILY. 90 tablet 1  .  UNABLE TO FIND One Touch Ultra Mini Test strips for patient to use with home machine. Patient is testing blood sugars at home 2 times daily. Dx code 250.00 100 each 6  . ezetimibe (ZETIA) 10 MG tablet Take 1 tablet (10 mg total) by mouth daily. 90 tablet 3  . nebivolol (BYSTOLIC) 5 MG tablet TAKE 1 TABLET(5 MG) BY MOUTH DAILY as needed (Patient not taking: Reported on 11/25/2018) 90 tablet 3   No current facility-administered medications for this visit.      Allergies:   Ace inhibitors; Ciprofloxacin; Codeine; and Onglyza  [saxagliptin]   Social History:  The patient  reports that she has never smoked. She has never used smokeless tobacco. She reports current alcohol use. She reports that she does not use drugs.   Family History:   family history includes Coronary artery disease in her father; Diabetes in her father, maternal grandfather, and paternal grandfather; Heart disease in her maternal grandmother; Hyperlipidemia in her mother; Stroke in her paternal grandmother.    Review of Systems: Review of Systems  Constitutional: Negative.   Respiratory: Negative.   Cardiovascular: Negative.        Tachycardia, left arm pain  Gastrointestinal: Negative.   Musculoskeletal: Negative.   Neurological: Negative.   Psychiatric/Behavioral: The patient is nervous/anxious.   All other systems reviewed and are negative.    PHYSICAL EXAM: VS:  BP (!) 158/88 (BP Location: Left Arm, Patient Position: Sitting, Cuff Size: Normal)   Pulse 92   Ht 5\' 1"  (1.549 m)   Wt 185 lb (83.9 kg)   BMI 34.96 kg/m  , BMI Body mass index is 34.96 kg/m. Constitutional:  oriented to person, place, and time. No distress.  HENT:  Head: Grossly normal Eyes:  no discharge. No scleral icterus.  Neck: No JVD, no carotid bruits  Cardiovascular: Regular rate and rhythm, no murmurs appreciated Pulmonary/Chest: Clear to auscultation bilaterally, no wheezes or rails Abdominal: Soft.  no distension.  no tenderness.  Musculoskeletal: Normal range of motion Neurological:  normal muscle tone. Coordination normal. No atrophy Skin: Skin warm and dry Psychiatric: normal affect, pleasant  Recent Labs: 08/28/2018: ALT 27; BUN 19; Creatinine, Ser 0.84; Potassium 3.8; Sodium 137; TSH 1.84    Lipid Panel Lab Results  Component Value Date   CHOL 220 (H) 08/28/2018   HDL 38.90 (L) 08/28/2018   LDLCALC 147 (H) 08/28/2018   TRIG 170.0 (H) 08/28/2018      Wt Readings from Last 3 Encounters:  11/25/18 185 lb (83.9 kg)  08/18/18 187 lb 6 oz  (85 kg)  06/02/18 191 lb 8 oz (86.9 kg)     ASSESSMENT AND PLAN:  Controlled type 2 diabetes mellitus without complication, without long-term current use of insulin (HCC) Stressed importance of dietary changes, weight loss Her weight does appear to be down 6 pounds from prior clinic visits  Essential hypertension Blood pressure elevated on today's visit Recommend she closely monitor blood pressure at home and call our office with numbers  Mixed hyperlipidemia Recommend she start Zetia 10 mg daily Ideally for diabetic goal LDL 100 or less More aggressive measures would put LDL at 70 or less given minimal coronary calcification  Obstructive sleep apnea Not on CPAP Recommended weight loss  Anxiety Reassurance provided Comforting coronary calcium score  Tachycardia Likely secondary to anxiety Symptoms well controlled  Disposition:   F/U  As needed   Total encounter time more than 60 minutes  Greater than 50% was spent in counseling and coordination  of care with the patient    Orders Placed This Encounter  Procedures  . EKG 12-Lead     Signed, Esmond Plants, M.D., Ph.D. 11/25/2018  [place], [place]

## 2018-11-25 ENCOUNTER — Ambulatory Visit: Payer: BC Managed Care – PPO | Admitting: Cardiovascular Disease

## 2018-11-25 ENCOUNTER — Encounter: Payer: Self-pay | Admitting: Cardiovascular Disease

## 2018-11-25 VITALS — BP 158/88 | HR 92 | Ht 61.0 in | Wt 185.0 lb

## 2018-11-25 DIAGNOSIS — R002 Palpitations: Secondary | ICD-10-CM

## 2018-11-25 DIAGNOSIS — E782 Mixed hyperlipidemia: Secondary | ICD-10-CM | POA: Diagnosis not present

## 2018-11-25 DIAGNOSIS — G4733 Obstructive sleep apnea (adult) (pediatric): Secondary | ICD-10-CM

## 2018-11-25 DIAGNOSIS — I10 Essential (primary) hypertension: Secondary | ICD-10-CM

## 2018-11-25 DIAGNOSIS — E119 Type 2 diabetes mellitus without complications: Secondary | ICD-10-CM | POA: Diagnosis not present

## 2018-11-25 DIAGNOSIS — F419 Anxiety disorder, unspecified: Secondary | ICD-10-CM

## 2018-11-25 MED ORDER — EZETIMIBE 10 MG PO TABS: 10.0000 mg | ORAL_TABLET | Freq: Every day | ORAL | 3 refills | Status: DC

## 2018-11-25 NOTE — Patient Instructions (Addendum)
Medication Instructions:   Please start zetia one a day for cholesterol  If you need a refill on your cardiac medications before your next appointment, please call your pharmacy.    Lab work: No new labs needed   If you have labs (blood work) drawn today and your tests are completely normal, you will receive your results only by: Marland Kitchen MyChart Message (if you have MyChart) OR . A paper copy in the mail If you have any lab test that is abnormal or we need to change your treatment, we will call you to review the results.   Testing/Procedures: No new testing needed   Follow-Up: At [facility], you and your health needs are our priority.  As part of our continuing mission to provide you with exceptional heart care, we have created designated Provider Care Teams.  These Care Teams include your primary Cardiologist (physician) and Advanced Practice Providers (APPs -  Physician Assistants and Nurse Practitioners) who all work together to provide you with the care you need, when you need it.  . You will need a follow up appointment as needed   . Providers on your designated Care Team:   . Murray Hodgkins, NP . Christell Faith, PA-C . Marrianne Mood, PA-C  Any Other Special Instructions Will Be Listed Below (If Applicable).  For educational health videos Log in to : [email or web address] Or : [email or web address], password : [email or web address]

## 2018-11-26 ENCOUNTER — Encounter: Payer: Self-pay | Admitting: Family

## 2018-11-26 ENCOUNTER — Ambulatory Visit: Payer: BC Managed Care – PPO | Admitting: Family

## 2018-11-26 VITALS — BP 142/76 | HR 97 | Temp 98.2°F | Wt 185.6 lb

## 2018-11-26 DIAGNOSIS — K219 Gastro-esophageal reflux disease without esophagitis: Secondary | ICD-10-CM

## 2018-11-26 DIAGNOSIS — J302 Other seasonal allergic rhinitis: Secondary | ICD-10-CM | POA: Diagnosis not present

## 2018-11-26 DIAGNOSIS — I10 Essential (primary) hypertension: Secondary | ICD-10-CM | POA: Diagnosis not present

## 2018-11-26 DIAGNOSIS — E119 Type 2 diabetes mellitus without complications: Secondary | ICD-10-CM | POA: Diagnosis not present

## 2018-11-26 DIAGNOSIS — G4700 Insomnia, unspecified: Secondary | ICD-10-CM | POA: Diagnosis not present

## 2018-11-26 DIAGNOSIS — M25512 Pain in left shoulder: Secondary | ICD-10-CM | POA: Insufficient documentation

## 2018-11-26 MED ORDER — HYDROCHLOROTHIAZIDE 12.5 MG PO CAPS: 12.5000 mg | ORAL_CAPSULE | Freq: Every day | ORAL | 0 refills | Status: DC

## 2018-11-26 NOTE — Assessment & Plan Note (Signed)
Resolved at this time. Benign exam. She also discussed with her cardiologist, Gollan, yesterday. Suspect injury may have caused. She will let me know if recurs ans stay vigilant.

## 2018-11-26 NOTE — Assessment & Plan Note (Signed)
Discussed long term risks of PPI. Education provided on red flag symptoms.

## 2018-11-26 NOTE — Progress Notes (Signed)
Subjective:    Patient ID: Jessica Houston, female    DOB: 1966/06/06, 53 y.o.   MRN: [id number]  CC: Jessica Houston is a 53 y.o. female who presents today for follow up.   HPI: GAD-improved. Deep breathing helps. Doesn't want a daily medication. Wakes up at 3am and trouble going back to sleep.   Allergies- Takes half zyrtec at bedtime for allergies.  Would like referral.   HTN- compliant with medication. Not checking BP at home. No CP. Had been on hctz in the past.   GERD- taking protonix 40mg  qd. EGD 2016. No h/o barretts esophagu  Left shoulder and neck has resolved. Started a couple of weeks ago, when working in house ( 'flipping house'). No pain today. H/o CTS -bilateral. Numbess in hands in first, second, third digits. Denies exertional chest pain or pressure, numbness or tingling radiating to left arm or jaw,dizziness, frequent headaches, changes in vision, or shortness of breath.  Heat and ibuprofen helps neck pain. Declines imaging today.   No palpitations today. Uses bystoloic 'maybe' once per month.    Gollan yesterday- started on zetia. Discussed left arm, neck, and shoulder pain. Coronary calcium score- reassuring.   HISTORY:  Past Medical History:  Diagnosis Date  . Anxiety   . Asthma   . Bronchitis   . Cancer ([place])    skin  . Diabetes mellitus    HgbA1c 6.8%, diet controlled. Optho 2010, urine microalbumin 10/2010  . Esophageal reflux    CONTROLLED ON CURRENT MEDICATION  . Family history of adverse reaction to anesthesia    dad nausea and vomiting  . Hyperlipidemia   . Hypertension   . PONV (postoperative nausea and vomiting)    with C-section  . S/P endoscopy    normal per pt. done at [facility]  . Sleep apnea    On CPAP  . Vitamin D deficiency    Past Surgical History:  Procedure Laterality Date  . CESAREAN SECTION N/A   . HEMORRHOID SURGERY N/A 06/21/2015   Procedure: Internal and external hemorrhoidectomy;  Surgeon: Leonie Green, MD;   Location: [facility] ORS;  Service: General;  Laterality: N/A;   Family History  Problem Relation Age of Onset  . Hyperlipidemia Mother   . Diabetes Father   . Coronary artery disease Father        agent orange contact  . Heart disease Maternal Grandmother   . Diabetes Maternal Grandfather   . Stroke Paternal Grandmother   . Diabetes Paternal Grandfather   . Breast cancer Neg Hx     Allergies: Ace inhibitors; Ciprofloxacin; Codeine; and Onglyza [saxagliptin] Current Outpatient Medications on File Prior to Visit  Medication Sig Dispense Refill  . Calcium Carbonate Antacid (TUMS PO) Take 1 tablet by mouth as needed.      . cetirizine (ZYRTEC) 10 MG tablet Take 10 mg by mouth daily.    . clotrimazole-betamethasone (LOTRISONE) cream apply to affected area(s) twice daily 30 g 0  . diclofenac sodium (VOLTAREN) 1 % GEL Apply 4 g topically 4 (four) times daily. 1 Tube 3  . Dulaglutide (TRULICITY) 6.37 CH/8.8FO SOPN Inject 0.75 mg into the skin once a week. 3 pen 3  . ezetimibe (ZETIA) 10 MG tablet Take 1 tablet (10 mg total) by mouth daily. 90 tablet 3  . fexofenadine (ALLEGRA) 180 MG tablet Take 180 mg by mouth daily.    Marland Kitchen FLOVENT HFA 110 MCG/ACT inhaler INHALE 1 PUFF INTO THE LUNGS TWICE DAILY 12 g  5  . fluticasone (FLONASE) 50 MCG/ACT nasal spray Use 2 sprays in each nostril once daily as needed 16 g 5  . glucose blood test strip Use as instructed with One Touch Delica Glucometer.  Dx: E11.9 100 each 4  . Lancets (ONETOUCH ULTRASOFT) lancets 1 each by Other route 2 (two) times daily. Dispense one touch nano lancets 150 each 4  . losartan (COZAAR) 100 MG tablet Take 1 tablet (100 mg total) by mouth daily. 90 tablet 3  . metFORMIN (GLUCOPHAGE) 500 MG tablet TAKE 1 TABLET(500 MG) BY MOUTH TWICE DAILY 180 tablet 0  . nebivolol (BYSTOLIC) 5 MG tablet TAKE 1 TABLET(5 MG) BY MOUTH DAILY as needed 90 tablet 3  . pantoprazole (PROTONIX) 40 MG tablet TAKE ONE TABLET BY MOUTH DAILY. 90 tablet 1  . UNABLE  TO FIND One Touch Ultra Mini Test strips for patient to use with home machine. Patient is testing blood sugars at home 2 times daily. Dx code 250.00 100 each 6   No current facility-administered medications on file prior to visit.     Social History   Tobacco Use  . Smoking status: Never Smoker  . Smokeless tobacco: Never Used  Substance Use Topics  . Alcohol use: Yes    Alcohol/week: 0.0 - 1.0 standard drinks    Comment: rarely  . Drug use: No    Review of Systems  Constitutional: Negative for chills and fever.  Respiratory: Negative for cough.   Cardiovascular: Negative for chest pain and palpitations.  Gastrointestinal: Negative for nausea and vomiting.      Objective:    BP (!) 142/76 (BP Location: Left Arm, Patient Position: Sitting, Cuff Size: Large)   Pulse 97   Temp 98.2 F (36.8 C)   Wt 185 lb 9.6 oz (84.2 kg)   SpO2 98%   BMI 35.07 kg/m  BP Readings from Last 3 Encounters:  11/26/18 (!) 142/76  11/25/18 (!) 158/88  08/18/18 140/70   Wt Readings from Last 3 Encounters:  11/26/18 185 lb 9.6 oz (84.2 kg)  11/25/18 185 lb (83.9 kg)  08/18/18 187 lb 6 oz (85 kg)    Physical Exam Vitals signs reviewed.  Constitutional:      Appearance: She is well-developed.  Eyes:     Conjunctiva/sclera: Conjunctivae normal.  Neck:     Musculoskeletal: Full passive range of motion without pain and normal range of motion. No edema or erythema.  Cardiovascular:     Rate and Rhythm: Normal rate and regular rhythm.     Pulses: Normal pulses.     Heart sounds: Normal heart sounds.  Pulmonary:     Effort: Pulmonary effort is normal.     Breath sounds: Normal breath sounds. No wheezing, rhonchi or rales.  Musculoskeletal:     Left shoulder: She exhibits normal range of motion, no tenderness, no pain and no spasm.     Cervical back: She exhibits normal range of motion, no tenderness and no swelling.     Comments: Left Shoulder:   No asymmetry of shoulders when comparing  right and left.No pain with palpation over glenohumeral joint lines, [place] joint, AC joint, or bicipital groove. No pain with internal and external rotation. No pain with resisted lateral extension .   Negative active painful arc sign. Negative passive arc ( Neer's). Negative drop arm.  Strength and sensation normal BUE's.   Skin:    General: Skin is warm and dry.  Neurological:     Mental Status: She is  alert.  Psychiatric:        Speech: Speech normal.        Behavior: Behavior normal.        Thought Content: Thought content normal.        Assessment & Plan:   Problem List Items Addressed This Visit      Cardiovascular and Mediastinum   Hypertension    Elevated. Start hctz. Close follow up      Relevant Medications   hydrochlorothiazide (MICROZIDE) 12.5 MG capsule   Other Relevant Orders   Basic metabolic panel     Respiratory   Allergic rhinitis   Relevant Orders   Ambulatory referral to Allergy     Digestive   GERD (gastroesophageal reflux disease)    Discussed long term risks of PPI. Education provided on red flag symptoms.         Endocrine   Diabetes mellitus type 2, controlled ([place]) - Primary   Relevant Orders   Hemoglobin A1c     Other   Insomnia    She will start melatonin. Discussed sleep hygiene practices. Declines medication such as zoloft for anxiety, trouble staying asleep. She will let me know how she is doing      Left shoulder pain    Resolved at this time. Benign exam. She also discussed with her cardiologist, Gollan, yesterday. Suspect injury may have caused. She will let me know if recurs ans stay vigilant.           I am having Lenard Lance maintain her Calcium Carbonate Antacid (TUMS PO), UNABLE TO FIND, clotrimazole-betamethasone, onetouch ultrasoft, diclofenac sodium, glucose blood, fluticasone, losartan, nebivolol, fexofenadine, cetirizine, FLOVENT HFA, pantoprazole, Dulaglutide, metFORMIN, ezetimibe, and  hydrochlorothiazide.   Meds ordered this encounter  Medications  . hydrochlorothiazide (MICROZIDE) 12.5 MG capsule    Sig: Take 1 capsule (12.5 mg total) by mouth daily.    Dispense:  90 capsule    Refill:  0    Order Specific Question:   Supervising Provider    Answer:   Crecencio Mc [[id number]]    Return precautions given.   Risks, benefits, and alternatives of the medications and treatment plan prescribed today were discussed, and patient expressed understanding.   Education regarding symptom management and diagnosis given to patient on AVS.  Continue to follow with Burnard Hawthorne, FNP for routine health maintenance.   Lenard Lance and I agreed with plan.   Mable Paris, FNP

## 2018-11-26 NOTE — Assessment & Plan Note (Signed)
She will start melatonin. Discussed sleep hygiene practices. Declines medication such as zoloft for anxiety, trouble staying asleep. She will let me know how she is doing

## 2018-11-26 NOTE — Assessment & Plan Note (Addendum)
Elevated. Start hctz. Close follow up

## 2018-11-26 NOTE — Patient Instructions (Addendum)
Essentials for good sleep:   #1 Exercise #2 Limit Caffeine ( no caffeine after lunch) #3 No smart phones, TV prior to bed -- BLUE light is VERY activating and send the brain an 'awake message.'  #4 Go to bed at same time of night each night and get up at same time of day.  #5 Take 0.5 to 5mg  melatonin at 7pm with dinner -this is when natural melatonin will start to increase #6 May try over the counter Unisom for sleep aid  Start HCTZ again Monitor blood pressure,  Goal is less than 120/80, based on newest guidelines; if persistently higher, please make sooner follow up appointment so we can recheck you blood pressure and manage medications  Repeat labs in ONE week; this is nonfasting.  Long term use beyond 3 months of proton pump inhibitors , also called PPI's, is associated with malabsorption of vitamins, chronic kidney disease, fracture risk, and diarrheal illnesses. PPI's include Nexium, Prilosec, Protonix, Dexilant, and Prevacid.   I generally recommend trying to control acid reflux with lifestyle modifications including avoiding trigger foods, not eating 2 hours prior to bedtime. You may use histamine 2 blockers daily to twice daily ( this is Zantac, Pepcid) and then when symptoms flare, start back on PPI for short course.    Managing Your Hypertension Hypertension is commonly called high blood pressure. This is when the force of your blood pressing against the walls of your arteries is too strong. Arteries are blood vessels that carry blood from your heart throughout your body. Hypertension forces the heart to work harder to pump blood, and may cause the arteries to become narrow or stiff. Having untreated or uncontrolled hypertension can cause heart attack, stroke, kidney disease, and other problems. What are blood pressure readings? A blood pressure reading consists of a higher number over a lower number. Ideally, your blood pressure should be below 120/80. The first ("top") number  is called the systolic pressure. It is a measure of the pressure in your arteries as your heart beats. The second ("bottom") number is called the diastolic pressure. It is a measure of the pressure in your arteries as the heart relaxes. What does my blood pressure reading mean? Blood pressure is classified into four stages. Based on your blood pressure reading, your health care provider may use the following stages to determine what type of treatment you need, if any. Systolic pressure and diastolic pressure are measured in a unit called mm Hg. Normal  Systolic pressure: below 503.  Diastolic pressure: below 80. Elevated  Systolic pressure: 546-568.  Diastolic pressure: below 80. Hypertension stage 1  Systolic pressure: 127-517.  Diastolic pressure: 00-17. Hypertension stage 2  Systolic pressure: 494 or above.  Diastolic pressure: 90 or above. What health risks are associated with hypertension? Managing your hypertension is an important responsibility. Uncontrolled hypertension can lead to:  A heart attack.  A stroke.  A weakened blood vessel (aneurysm).  Heart failure.  Kidney damage.  Eye damage.  Metabolic syndrome.  Memory and concentration problems. What changes can I make to manage my hypertension? Hypertension can be managed by making lifestyle changes and possibly by taking medicines. Your health care provider will help you make a plan to bring your blood pressure within a normal range. Eating and drinking   Eat a diet that is high in fiber and potassium, and low in salt (sodium), added sugar, and fat. An example eating plan is called the DASH (Dietary Approaches to Stop Hypertension) diet.  To eat this way: ? Eat plenty of fresh fruits and vegetables. Try to fill half of your plate at each meal with fruits and vegetables. ? Eat whole grains, such as whole wheat pasta, brown rice, or whole grain bread. Fill about one quarter of your plate with whole  grains. ? Eat low-fat diary products. ? Avoid fatty cuts of meat, processed or cured meats, and poultry with skin. Fill about one quarter of your plate with lean proteins such as fish, chicken without skin, beans, eggs, and tofu. ? Avoid premade and processed foods. These tend to be higher in sodium, added sugar, and fat.  Reduce your daily sodium intake. Most people with hypertension should eat less than 1,500 mg of sodium a day.  Limit alcohol intake to no more than 1 drink a day for nonpregnant women and 2 drinks a day for men. One drink equals 12 oz of beer, 5 oz of wine, or 1 oz of hard liquor. Lifestyle  Work with your health care provider to maintain a healthy body weight, or to lose weight. Ask what an ideal weight is for you.  Get at least 30 minutes of exercise that causes your heart to beat faster (aerobic exercise) most days of the week. Activities may include walking, swimming, or biking.  Include exercise to strengthen your muscles (resistance exercise), such as weight lifting, as part of your weekly exercise routine. Try to do these types of exercises for 30 minutes at least 3 days a week.  Do not use any products that contain nicotine or tobacco, such as cigarettes and e-cigarettes. If you need help quitting, ask your health care provider.  Control any long-term (chronic) conditions you have, such as high cholesterol or diabetes. Monitoring  Monitor your blood pressure at home as told by your health care provider. Your personal target blood pressure may vary depending on your medical conditions, your age, and other factors.  Have your blood pressure checked regularly, as often as told by your health care provider. Working with your health care provider  Review all the medicines you take with your health care provider because there may be side effects or interactions.  Talk with your health care provider about your diet, exercise habits, and other lifestyle factors that  may be contributing to hypertension.  Visit your health care provider regularly. Your health care provider can help you create and adjust your plan for managing hypertension. Will I need medicine to control my blood pressure? Your health care provider may prescribe medicine if lifestyle changes are not enough to get your blood pressure under control, and if:  Your systolic blood pressure is 130 or higher.  Your diastolic blood pressure is 80 or higher. Take medicines only as told by your health care provider. Follow the directions carefully. Blood pressure medicines must be taken as prescribed. The medicine does not work as well when you skip doses. Skipping doses also puts you at risk for problems. Contact a health care provider if:  You think you are having a reaction to medicines you have taken.  You have repeated (recurrent) headaches.  You feel dizzy.  You have swelling in your ankles.  You have trouble with your vision. Get help right away if:  You develop a severe headache or confusion.  You have unusual weakness or numbness, or you feel faint.  You have severe pain in your chest or abdomen.  You vomit repeatedly.  You have trouble breathing. Summary  Hypertension is when the  force of blood pumping through your arteries is too strong. If this condition is not controlled, it may put you at risk for serious complications.  Your personal target blood pressure may vary depending on your medical conditions, your age, and other factors. For most people, a normal blood pressure is less than 120/80.  Hypertension is managed by lifestyle changes, medicines, or both. Lifestyle changes include weight loss, eating a healthy, low-sodium diet, exercising more, and limiting alcohol. This information is not intended to replace advice given to you by your health care provider. Make sure you discuss any questions you have with your health care provider. Document Released: 07/30/2012  Document Revised: 10/03/2016 Document Reviewed: 10/03/2016 Elsevier Interactive Patient Education  2019 [employer].  Of note, we will need to do an endoscopy ( upper GI) to evaluate your esophagus, stomach in the future if acid reflux persists are you develop red flag symptoms: trouble swallowing, hoarseness, chronic cough, unexplained weight loss.

## 2018-12-05 ENCOUNTER — Other Ambulatory Visit (INDEPENDENT_AMBULATORY_CARE_PROVIDER_SITE_OTHER): Payer: BC Managed Care – PPO

## 2018-12-05 ENCOUNTER — Other Ambulatory Visit: Payer: Self-pay | Admitting: Cardiovascular Disease

## 2018-12-05 DIAGNOSIS — E119 Type 2 diabetes mellitus without complications: Secondary | ICD-10-CM

## 2018-12-05 DIAGNOSIS — I10 Essential (primary) hypertension: Secondary | ICD-10-CM | POA: Diagnosis not present

## 2018-12-05 LAB — BASIC METABOLIC PANEL
BUN: 14 mg/dL (ref 6–23)
CHLORIDE: 102 meq/L (ref 96–112)
CO2: 26 mEq/L (ref 19–32)
Calcium: 9.2 mg/dL (ref 8.4–10.5)
Creatinine, Ser: 0.77 mg/dL (ref 0.40–1.20)
GFR: 78.46 mL/min (ref >60.00)
Glucose, Bld: 133 mg/dL — ABNORMAL HIGH (ref 70–99)
POTASSIUM: 3.9 meq/L (ref 3.5–5.1)
Sodium: 137 mEq/L (ref 135–145)

## 2018-12-05 LAB — HEMOGLOBIN A1C: Hgb A1c MFr Bld: 6.8 % — ABNORMAL HIGH (ref 4.6–6.5)

## 2018-12-05 MED ORDER — EZETIMIBE 10 MG PO TABS: 10.0000 mg | ORAL_TABLET | Freq: Every day | ORAL | 3 refills | Status: DC

## 2018-12-05 NOTE — Telephone Encounter (Signed)
*  STAT* If patient is at the pharmacy, call can be transferred to refill team.   1. Which medications need to be refilled? (please list name of each medication and dose if known) Zetia  2. Which pharmacy/location (including street and city if local pharmacy) is medication to be sent to? [facility]  3. Do they need a 30 day or 90 day supply? 90 day Pt would also like the copay card with this.

## 2018-12-05 NOTE — Telephone Encounter (Signed)
Lmovm to let pt know we don't have any copay cards at this time.  I wanted to advise pt that cash pay customers can go to [facility] in [place] and pay $15 for zetia.

## 2018-12-05 NOTE — Telephone Encounter (Signed)
Requested Prescriptions   Signed Prescriptions Disp Refills   ezetimibe (ZETIA) 10 MG tablet 90 tablet 3    Sig: Take 1 tablet (10 mg total) by mouth daily.    Authorizing Provider: GOLLAN, TIMOTHY J    Ordering User: Betsey Sossamon C    

## 2018-12-17 ENCOUNTER — Encounter: Payer: Self-pay | Admitting: Family

## 2018-12-18 ENCOUNTER — Telehealth: Payer: Self-pay

## 2018-12-18 NOTE — Telephone Encounter (Signed)
LMTCB to try to get patient appointment to be seen for possible sinus infection.

## 2018-12-23 IMAGING — CT CT HEART SCORING
2 series · 16 of 20 positions shown, 18 images · non-contrast
Comparison: 03/23/2009

CLINICAL DATA: Risk stratification

EXAM:
Coronary Calcium Score
TECHNIQUE: The patient was scanned on a Siemens Somatom 64 slice scanner. Axial
non-contrast 3 mm slices were carried out through the heart. The
data set was analyzed on a dedicated work station and scored using
the Agatson method.

[Series 2: casc 3.0 i36f 2 bestdiast 71 % · axial · 0.36mm/px · z∈[-127,-43]mm · 8 of 38 slices shown, 10 images]
[im 5/38  vessel]
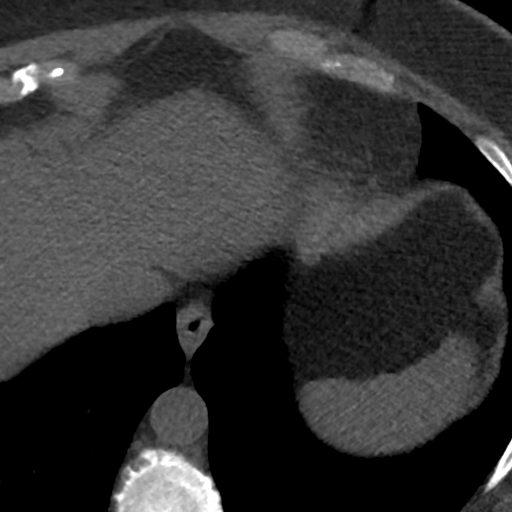
[im 5/38  lung]
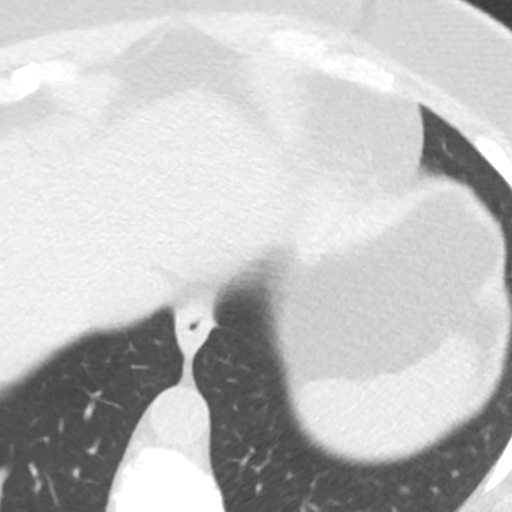
[im 9/38  vessel]
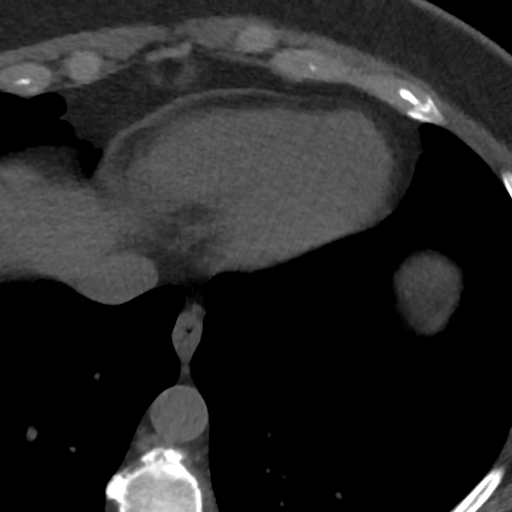
[im 13/38  vessel]
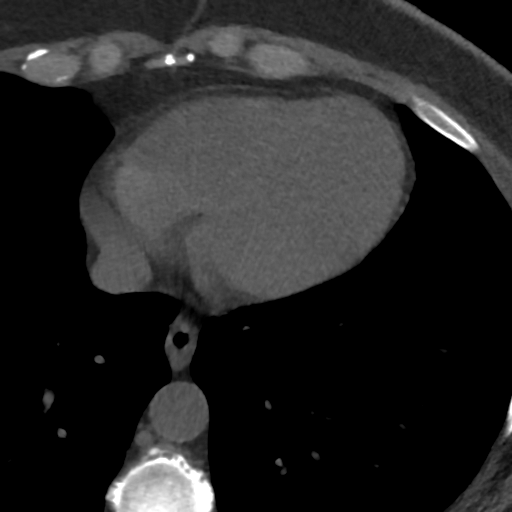
[im 17/38  vessel]
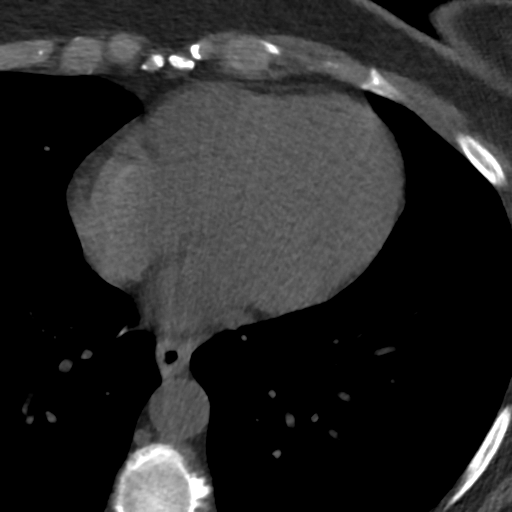
[im 21/38  vessel]
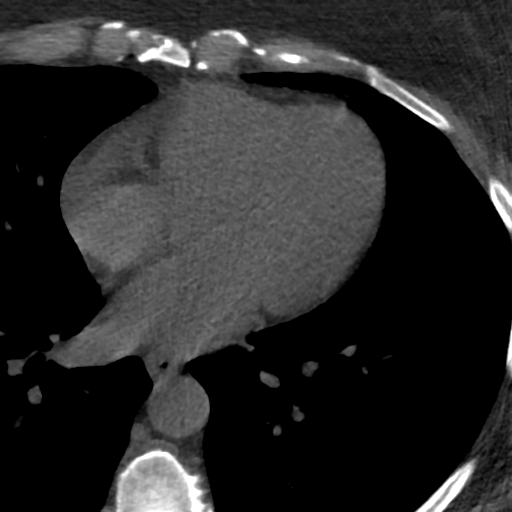
[im 21/38  lung]
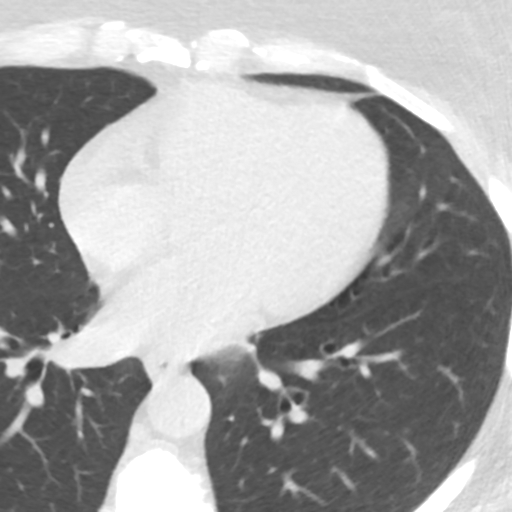
[im 25/38  vessel]
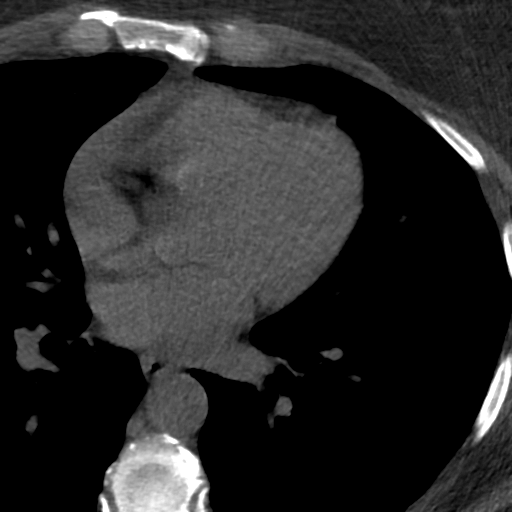
[im 29/38  vessel]
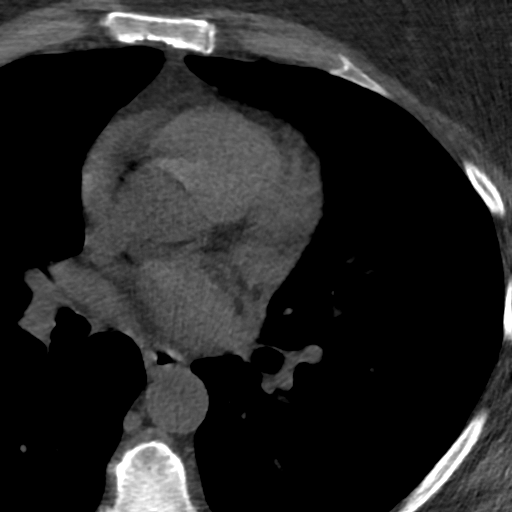
[im 33/38  vessel]
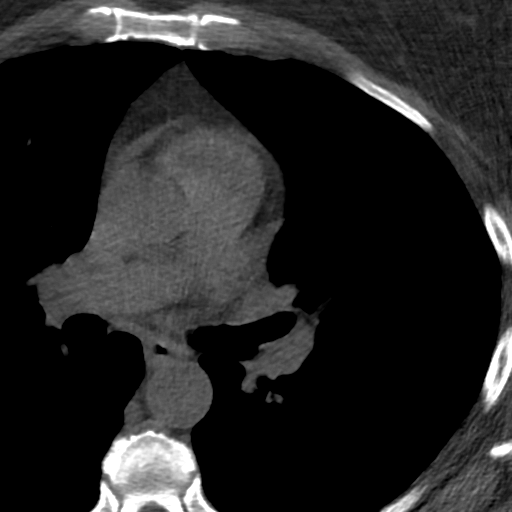

[Series 4: lung st 70 % · axial · 0.66mm/px · z∈[-130,-43]mm · 8 of 39 slices shown]
[im 5/39  lung]
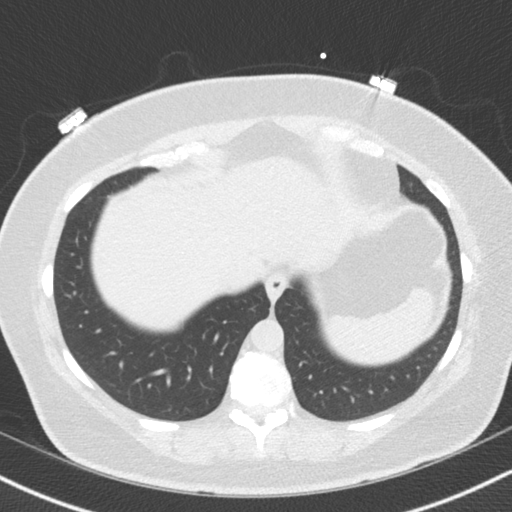
[im 9/39  lung]
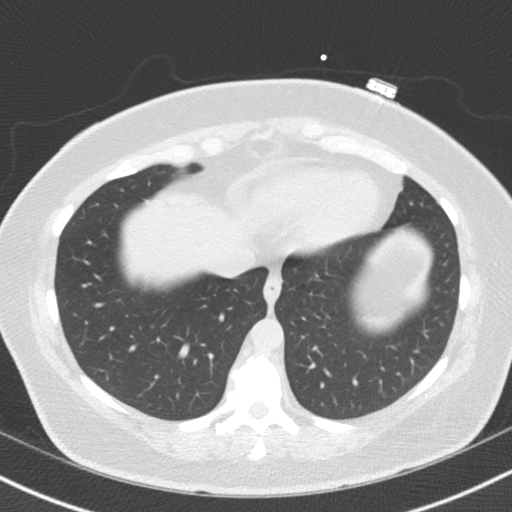
[im 13/39  lung]
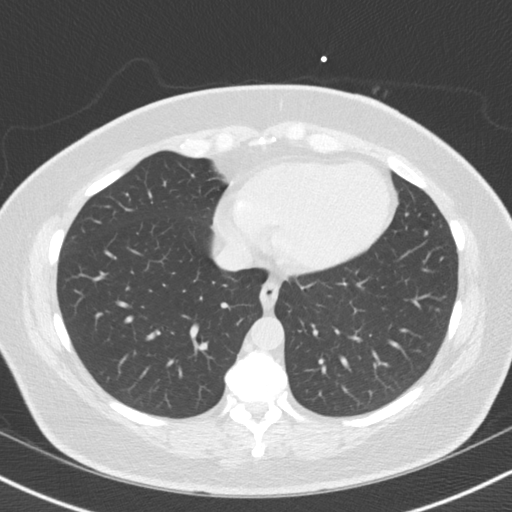
[im 17/39  lung]
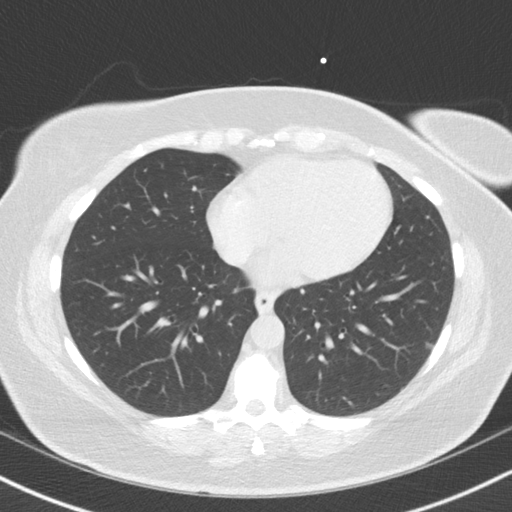
[im 22/39  lung]
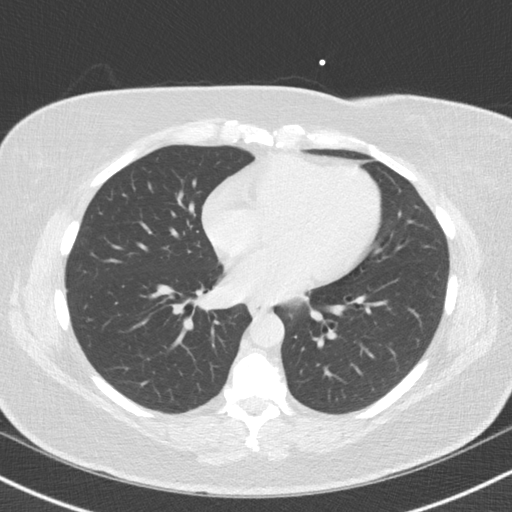
[im 26/39  lung]
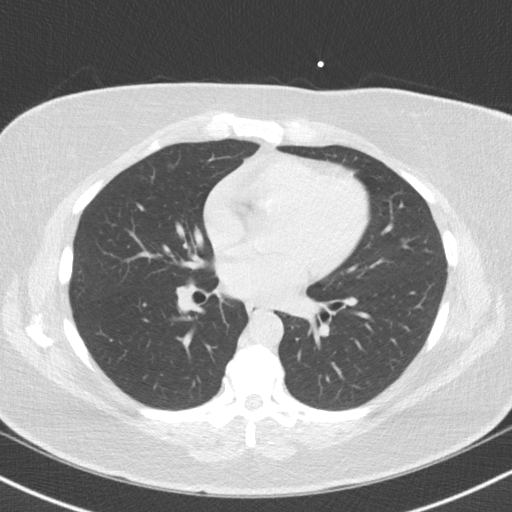
[im 30/39  lung]
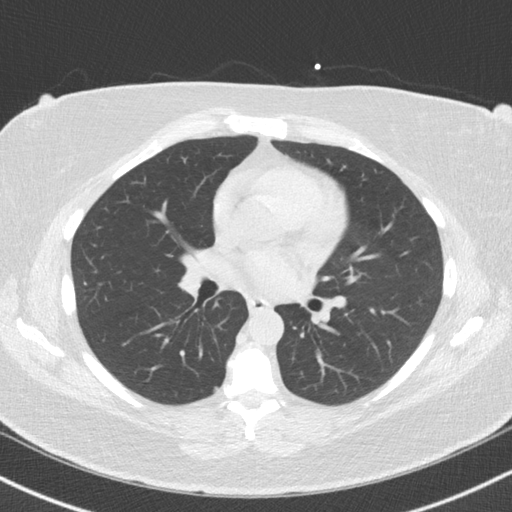
[im 34/39  lung]
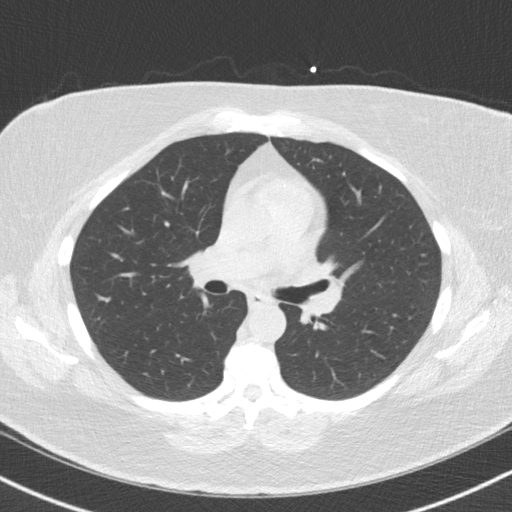

[16 of 20 positions shown; findings below may reference images not displayed]

FINDINGS: Non-cardiac: See separate report from [REDACTED].

Ascending Aorta: Normal diameter 3.1 cm

Pericardium: Normal

Coronary arteries: Mild calcium seen in proximal RCA
IMPRESSION: Coronary calcium score of 3 . This was 83 [REDACTED] percentile for age and
sex matched control.

There is a question of calcium noted in the right atrium consider
f/u echo to further evaluate

Cheong Ming Buana

EXAM:
OVER-READ INTERPRETATION  CT CHEST

The following report is an over-read performed by radiologist Dr.
Nya Jumper [REDACTED] on 06/11/2018. This over-read
does not include interpretation of cardiac or coronary anatomy or
pathology. The coronary calcium score interpretation by the
cardiologist is attached.
FINDINGS: Vascular: Heart is normal size.  Visualized aorta is normal caliber.

Mediastinum/Nodes: No adenopathy in the lower mediastinum or hila.

Lungs/Pleura: Irregular density peripherally in the left lower lobe
on image 24 is stable since 6727 compatible with scar. No acute or
confluent airspace opacity. No effusion.

Upper Abdomen: No acute findings in the upper abdomen.

Musculoskeletal: Chest wall soft tissues are unremarkable. No acute
bony abnormality.
IMPRESSION: No acute or significant extracardiac abnormality.

## 2019-01-19 ENCOUNTER — Encounter: Payer: Self-pay | Admitting: Family

## 2019-01-20 NOTE — Telephone Encounter (Signed)
Sent to provider to advise

## 2019-01-21 ENCOUNTER — Other Ambulatory Visit: Payer: Self-pay | Admitting: Family

## 2019-01-21 ENCOUNTER — Encounter: Payer: Self-pay | Admitting: Family

## 2019-01-21 DIAGNOSIS — M79673 Pain in unspecified foot: Secondary | ICD-10-CM

## 2019-01-23 ENCOUNTER — Other Ambulatory Visit: Payer: Self-pay | Admitting: Family

## 2019-01-23 MED ORDER — FLUTICASONE PROPIONATE HFA 110 MCG/ACT IN AERO: INHALATION_SPRAY | RESPIRATORY_TRACT | 5 refills | Status: DC

## 2019-01-23 NOTE — Telephone Encounter (Signed)
Copied from [place] [id number]. Topic: Quick Communication - Rx Refill/Question >> Jan 23, 2019  9:39 AM Reyne Dumas L wrote: Medication: FLOVENT HFA 110 MCG/ACT inhaler  Has the patient contacted their pharmacy? Yes - transferring pharmacy and doesn't have a refill left to send over.  Pt thinks she is out or only has a few puffs left (Agent: If no, request that the patient contact the pharmacy for the refill.) (Agent: If yes, when and what did the pharmacy advise?)  Preferred Pharmacy (with phone number or street name): [place], [place] [phone] (Phone) [phone] (Fax)  Agent: Please be advised that RX refills may take up to 3 business days. We ask that you follow-up with your pharmacy.

## 2019-01-23 NOTE — Telephone Encounter (Signed)
Requested Prescriptions  Pending Prescriptions Disp Refills  . fluticasone (FLOVENT HFA) 110 MCG/ACT inhaler 12 g 5    Sig: INHALE 1 PUFF INTO THE LUNGS TWICE DAILY     Pulmonology:  Corticosteroids Passed - 01/23/2019 10:18 AM      Passed - Valid encounter within last 12 months    Recent Outpatient Visits          1 month ago Controlled type 2 diabetes mellitus without complication, without long-term current use of insulin ([place])   [place], Yvetta Coder, FNP   5 months ago Essential hypertension   [place], Yvetta Coder, FNP   8 months ago Breast pain, left   [facility] Burnard Hawthorne, FNP   1 year ago Controlled type 2 diabetes mellitus without complication, without long-term current use of insulin [facility])   [place], Yvetta Coder, FNP   1 year ago Controlled type 2 diabetes mellitus without complication, without long-term current use of insulin [facility])   [place], Yvetta Coder, [place]

## 2019-02-04 ENCOUNTER — Encounter: Payer: Self-pay | Admitting: Podiatry

## 2019-02-04 ENCOUNTER — Other Ambulatory Visit: Payer: Self-pay

## 2019-02-04 ENCOUNTER — Ambulatory Visit (INDEPENDENT_AMBULATORY_CARE_PROVIDER_SITE_OTHER): Payer: BC Managed Care – PPO

## 2019-02-04 ENCOUNTER — Ambulatory Visit: Payer: BC Managed Care – PPO | Admitting: Podiatry

## 2019-02-04 ENCOUNTER — Other Ambulatory Visit: Payer: Self-pay | Admitting: Podiatry

## 2019-02-04 DIAGNOSIS — M722 Plantar fascial fibromatosis: Secondary | ICD-10-CM

## 2019-02-04 DIAGNOSIS — M779 Enthesopathy, unspecified: Principal | ICD-10-CM

## 2019-02-04 DIAGNOSIS — M778 Other enthesopathies, not elsewhere classified: Secondary | ICD-10-CM

## 2019-02-04 MED ORDER — MELOXICAM 15 MG PO TABS: 15.0000 mg | ORAL_TABLET | Freq: Every day | ORAL | 3 refills | Status: DC

## 2019-02-04 MED ORDER — METHYLPREDNISOLONE 4 MG PO TBPK: ORAL_TABLET | ORAL | 0 refills | Status: DC

## 2019-02-04 NOTE — Progress Notes (Signed)
She presents today for for right lateral sided foot pain times the past 2 to 3 months states that the pain comes and goes but has burning pains when she is walking and moving the foot certain ways it hurts.  She states that the pain wakes her up at night sometimes and she has only taken Advil and use heat for the pain.  Objective: Vital signs are stable she is alert oriented x3.  Pulses are palpable.  He has pain on palpation of the peroneal tendons of the right foot pain on palpation of the fifth metatarsal of the right foot she also has pain on palpation of the fourth fifth TMT right foot.  The largest finding is pain on palpation medial calcaneal tubercle of the right heel.  Radiographs confirm this.  No fractures are identified.  No acute findings.  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  Plan: At this point start her on a Medrol Dosepak to be followed by meloxicam.  Placed her in a night splint and provided her with an injection 20 mg Kenalog 5 mg Marcaine for maximal tenderness.  Tolerated seizure well without complications.

## 2019-02-05 ENCOUNTER — Telehealth: Payer: Self-pay | Admitting: Podiatry

## 2019-02-05 NOTE — Telephone Encounter (Signed)
Pt states she got a boot yesterday and is unsure if it fits properly.

## 2019-02-05 NOTE — Telephone Encounter (Signed)
I spoke with patient, she stated that her boot is not fitting properly and she thinks its too big.  I instructed her to come in to make sure boot it on correctly.  She will come in on Monday afternoon for proper fitting of boot.

## 2019-02-18 ENCOUNTER — Other Ambulatory Visit: Payer: Self-pay | Admitting: Family

## 2019-02-18 DIAGNOSIS — E119 Type 2 diabetes mellitus without complications: Secondary | ICD-10-CM

## 2019-02-19 ENCOUNTER — Other Ambulatory Visit: Payer: Self-pay | Admitting: Family

## 2019-02-20 MED ORDER — FLUTICASONE PROPIONATE 50 MCG/ACT NA SUSP: NASAL | 5 refills | Status: AC

## 2019-02-20 NOTE — Telephone Encounter (Signed)
Requested Prescriptions  Pending Prescriptions Disp Refills  . fluticasone (FLONASE) 50 MCG/ACT nasal spray 16 g 5    Sig: Use 2 sprays in each nostril once daily as needed     Ear, Nose, and Throat: Nasal Preparations - Corticosteroids Passed - 02/19/2019  8:27 PM      Passed - Valid encounter within last 12 months    Recent Outpatient Visits          2 months ago Controlled type 2 diabetes mellitus without complication, without long-term current use of insulin ([place])   [place], Yvetta Coder, FNP   6 months ago Essential hypertension   [place], Yvetta Coder, FNP   9 months ago Breast pain, left   [facility] Burnard Hawthorne, FNP   1 year ago Controlled type 2 diabetes mellitus without complication, without long-term current use of insulin [facility])   [place], Yvetta Coder, FNP   1 year ago Controlled type 2 diabetes mellitus without complication, without long-term current use of insulin [facility])   [place], Yvetta Coder, [place]

## 2019-03-16 ENCOUNTER — Ambulatory Visit: Payer: BC Managed Care – PPO | Admitting: Podiatry

## 2019-04-03 NOTE — Telephone Encounter (Signed)
error 

## 2019-04-30 ENCOUNTER — Other Ambulatory Visit: Payer: Self-pay | Admitting: Family

## 2019-05-01 MED ORDER — METFORMIN HCL 500 MG PO TABS: 500.0000 mg | ORAL_TABLET | Freq: Two times a day (BID) | ORAL | 0 refills | Status: DC

## 2019-05-27 ENCOUNTER — Ambulatory Visit: Payer: BC Managed Care – PPO | Admitting: Family

## 2019-06-08 ENCOUNTER — Other Ambulatory Visit: Payer: Self-pay | Admitting: Internal Medicine

## 2019-06-08 DIAGNOSIS — Z1231 Encounter for screening mammogram for malignant neoplasm of breast: Secondary | ICD-10-CM

## 2019-08-06 ENCOUNTER — Other Ambulatory Visit: Payer: Self-pay | Admitting: Cardiovascular Disease

## 2019-08-06 DIAGNOSIS — E119 Type 2 diabetes mellitus without complications: Secondary | ICD-10-CM

## 2019-09-10 ENCOUNTER — Ambulatory Visit
Admission: RE | Admit: 2019-09-10 | Discharge: 2019-09-10 | Disposition: A | Discharge status: Home / Self Care | Payer: BC Managed Care – PPO | Source: Ambulatory Visit | Attending: Internal Medicine | Admitting: Internal Medicine

## 2019-09-10 DIAGNOSIS — Z1231 Encounter for screening mammogram for malignant neoplasm of breast: Secondary | ICD-10-CM | POA: Diagnosis not present

## 2019-10-16 ENCOUNTER — Other Ambulatory Visit: Payer: Self-pay | Admitting: Family

## 2019-12-04 ENCOUNTER — Ambulatory Visit: Payer: BC Managed Care – PPO | Attending: Internal Medicine

## 2019-12-04 DIAGNOSIS — Z20822 Contact with and (suspected) exposure to covid-19: Secondary | ICD-10-CM

## 2019-12-05 LAB — NOVEL CORONAVIRUS, NAA: SARS-CoV-2, NAA: DETECTED — AB

## 2020-01-08 ENCOUNTER — Other Ambulatory Visit: Payer: Self-pay | Admitting: Internal Medicine

## 2020-01-08 DIAGNOSIS — M79651 Pain in right thigh: Secondary | ICD-10-CM

## 2020-01-13 ENCOUNTER — Other Ambulatory Visit: Payer: Self-pay

## 2020-01-13 ENCOUNTER — Ambulatory Visit
Admission: RE | Admit: 2020-01-13 | Discharge: 2020-01-13 | Disposition: A | Discharge status: Home / Self Care | Payer: BC Managed Care – PPO | Source: Ambulatory Visit | Attending: Internal Medicine | Admitting: Internal Medicine

## 2020-01-13 DIAGNOSIS — M79651 Pain in right thigh: Secondary | ICD-10-CM | POA: Insufficient documentation

## 2020-02-17 ENCOUNTER — Other Ambulatory Visit: Payer: Self-pay | Admitting: Physician Assistant

## 2020-02-17 DIAGNOSIS — R7989 Other specified abnormal findings of blood chemistry: Secondary | ICD-10-CM

## 2020-02-17 DIAGNOSIS — R1011 Right upper quadrant pain: Secondary | ICD-10-CM

## 2020-02-23 ENCOUNTER — Ambulatory Visit
Admission: RE | Admit: 2020-02-23 | Discharge: 2020-02-23 | Disposition: A | Discharge status: Home / Self Care | Payer: BC Managed Care – PPO | Source: Ambulatory Visit | Attending: Physician Assistant | Admitting: Physician Assistant

## 2020-02-23 ENCOUNTER — Other Ambulatory Visit: Payer: Self-pay

## 2020-02-23 DIAGNOSIS — R1011 Right upper quadrant pain: Secondary | ICD-10-CM | POA: Diagnosis present

## 2020-02-23 DIAGNOSIS — R7989 Other specified abnormal findings of blood chemistry: Secondary | ICD-10-CM | POA: Insufficient documentation

## 2020-03-14 ENCOUNTER — Other Ambulatory Visit: Payer: Self-pay | Admitting: Obstetrics and Gynecology

## 2020-03-20 ENCOUNTER — Other Ambulatory Visit: Payer: Self-pay | Admitting: Family

## 2020-03-29 ENCOUNTER — Other Ambulatory Visit: Payer: BC Managed Care – PPO

## 2020-03-29 ENCOUNTER — Encounter
Admission: RE | Admit: 2020-03-29 | Discharge: 2020-03-29 | Disposition: A | Discharge status: Home / Self Care | Payer: BC Managed Care – PPO | Source: Ambulatory Visit | Attending: Obstetrics and Gynecology | Admitting: Obstetrics and Gynecology

## 2020-03-29 ENCOUNTER — Other Ambulatory Visit: Payer: Self-pay

## 2020-03-29 DIAGNOSIS — E118 Type 2 diabetes mellitus with unspecified complications: Secondary | ICD-10-CM | POA: Insufficient documentation

## 2020-03-29 DIAGNOSIS — I10 Essential (primary) hypertension: Secondary | ICD-10-CM | POA: Insufficient documentation

## 2020-03-29 DIAGNOSIS — Z01818 Encounter for other preprocedural examination: Secondary | ICD-10-CM | POA: Insufficient documentation

## 2020-03-29 NOTE — Patient Instructions (Signed)
Your procedure is scheduled on: Friday 04/08/20.   Report to DAY SURGERY DEPARTMENT LOCATED ON 2ND FLOOR MEDICAL MALL ENTRANCE. To find out your arrival time please call [phone] between 1PM - 3PM on Thursday 04/07/20.    Remember: Instructions that are not followed completely may result in serious medical risk, up to and including death, or upon the discretion of your surgeon and anesthesiologist your surgery may need to be rescheduled.       _X__ 1. Do not eat food after midnight the night before your procedure.                 No gum chewing or hard candies. You may drink WATER up to 2 hours                 before you are scheduled to arrive for your surgery- DO NOT water                 liquids within 2 hours of the start of your surgery.                   **Dr. Leafy Ro would like for you to finish the G2 Gatorade 2 hours before your arrival time on the morning of your surgery.**    __X__2.  On the morning of surgery brush your teeth with toothpaste and water, you may rinse your mouth with mouthwash if you wish.  Do not swallow any toothpaste or mouthwash.       _X__ 3.  No Alcohol for 24 hours before or after surgery.    __X__4.  Notify your doctor if there is any change in your medical condition      (cold, fever, infections).       Do not wear jewelry, make-up, hairpins, clips or nail polish. Do not wear lotions, powders, or perfumes.  Do not shave 48 hours prior to surgery. Men may shave face and neck. Do not bring valuables to the hospital.     [facility] is not responsible for any belongings or valuables.    Contacts, dentures/partials or body piercings may not be worn into surgery. Bring a case for your contacts, glasses or hearing aids, a denture cup will be supplied.    Patients discharged the day of surgery will not be allowed to drive home.     __X__ Take these medicines the morning of surgery with A SIP OF WATER:     1. fluticasone (FLOVENT  HFA)   2. fexofenadine (ALLEGRA)  3. pantoprazole (PROTONIX)    __X__ Use CHG Soap as directed   __ X__ Use inhalers on the day of surgery. Also bring the inhaler with you to the hospital on the morning of surgery.   __X__ Stop Metformin 2 days prior to surgery. Your last dose will be on Tuesday 04/05/20.   __X__ Stop Anti-inflammatories 7 days before surgery such as Advil, Ibuprofen, Motrin, BC or Goodies Powder, Naprosyn, Naproxen, Aleve, Aspirin, Meloxicam. May take Tylenol if needed for pain or discomfort.    __X__ Do not start any new herbal supplements or vitamins prior to your procedure.   __X__ Bring C-Pap to the hospital.

## 2020-03-30 ENCOUNTER — Other Ambulatory Visit: Payer: Self-pay | Admitting: Family

## 2020-03-31 ENCOUNTER — Encounter
Admission: RE | Admit: 2020-03-31 | Discharge: 2020-03-31 | Disposition: A | Discharge status: Home / Self Care | Payer: BC Managed Care – PPO | Source: Ambulatory Visit | Attending: Obstetrics and Gynecology | Admitting: Obstetrics and Gynecology

## 2020-03-31 ENCOUNTER — Other Ambulatory Visit: Payer: Self-pay

## 2020-03-31 DIAGNOSIS — I10 Essential (primary) hypertension: Secondary | ICD-10-CM | POA: Diagnosis not present

## 2020-03-31 DIAGNOSIS — Z01818 Encounter for other preprocedural examination: Secondary | ICD-10-CM | POA: Insufficient documentation

## 2020-03-31 DIAGNOSIS — E119 Type 2 diabetes mellitus without complications: Secondary | ICD-10-CM | POA: Insufficient documentation

## 2020-03-31 LAB — TYPE AND SCREEN
ABO/RH(D): O POS
Antibody Screen: NEGATIVE

## 2020-03-31 LAB — BASIC METABOLIC PANEL
Anion gap: 10 (ref 5–15)
BUN: 18 mg/dL (ref 6–20)
CO2: 27 mmol/L (ref 22–32)
Calcium: 9 mg/dL (ref 8.9–10.3)
Chloride: 99 mmol/L (ref 98–111)
Creatinine, Ser: 0.79 mg/dL (ref 0.44–1.00)
GFR calc Af Amer: >60 mL/min (ref >60)
GFR calc non Af Amer: >60 mL/min (ref >60)
Glucose, Bld: 125 mg/dL — ABNORMAL HIGH (ref 70–99)
Potassium: 3.6 mmol/L (ref 3.5–5.1)
Sodium: 136 mmol/L (ref 135–145)

## 2020-03-31 LAB — CBC
HCT: 43 % (ref 36.0–46.0)
Hemoglobin: 15.1 g/dL — ABNORMAL HIGH (ref 12.0–15.0)
MCH: 30.9 pg (ref 26.0–34.0)
MCHC: 35.1 g/dL (ref 30.0–36.0)
MCV: 88.1 fL (ref 80.0–100.0)
Platelets: 394 10*3/uL (ref 150–400)
RBC: 4.88 MIL/uL (ref 3.87–5.11)
RDW: 12.7 % (ref 11.5–15.5)
WBC: 13.9 10*3/uL — ABNORMAL HIGH (ref 4.0–10.5)
nRBC: 0 % (ref 0.0–0.2)

## 2020-04-06 ENCOUNTER — Other Ambulatory Visit: Payer: Self-pay

## 2020-04-06 ENCOUNTER — Other Ambulatory Visit
Admission: RE | Admit: 2020-04-06 | Discharge: 2020-04-06 | Disposition: A | Discharge status: Home / Self Care | Payer: BC Managed Care – PPO | Source: Ambulatory Visit | Attending: Obstetrics and Gynecology | Admitting: Obstetrics and Gynecology

## 2020-04-06 DIAGNOSIS — Z01812 Encounter for preprocedural laboratory examination: Secondary | ICD-10-CM | POA: Diagnosis not present

## 2020-04-06 DIAGNOSIS — Z20822 Contact with and (suspected) exposure to covid-19: Secondary | ICD-10-CM | POA: Diagnosis not present

## 2020-04-07 LAB — SARS CORONAVIRUS 2 (TAT 6-24 HRS): SARS Coronavirus 2: NEGATIVE

## 2020-04-08 ENCOUNTER — Ambulatory Visit: Payer: BC Managed Care – PPO | Admitting: Anesthesiology

## 2020-04-08 ENCOUNTER — Encounter
Admission: RE | Disposition: A | Discharge status: Home / Self Care | Payer: Self-pay | Source: Ambulatory Visit | Attending: Obstetrics and Gynecology

## 2020-04-08 ENCOUNTER — Encounter: Payer: Self-pay | Admitting: Obstetrics and Gynecology

## 2020-04-08 ENCOUNTER — Ambulatory Visit
Admission: RE | Admit: 2020-04-08 | Discharge: 2020-04-08 | Disposition: A | Discharge status: Home / Self Care | Payer: BC Managed Care – PPO | Source: Ambulatory Visit | Attending: Obstetrics and Gynecology | Admitting: Obstetrics and Gynecology

## 2020-04-08 ENCOUNTER — Other Ambulatory Visit: Payer: Self-pay

## 2020-04-08 DIAGNOSIS — G4733 Obstructive sleep apnea (adult) (pediatric): Secondary | ICD-10-CM | POA: Diagnosis not present

## 2020-04-08 DIAGNOSIS — N840 Polyp of corpus uteri: Secondary | ICD-10-CM | POA: Insufficient documentation

## 2020-04-08 DIAGNOSIS — D649 Anemia, unspecified: Secondary | ICD-10-CM | POA: Insufficient documentation

## 2020-04-08 DIAGNOSIS — Z885 Allergy status to narcotic agent status: Secondary | ICD-10-CM | POA: Diagnosis not present

## 2020-04-08 DIAGNOSIS — E119 Type 2 diabetes mellitus without complications: Secondary | ICD-10-CM | POA: Insufficient documentation

## 2020-04-08 DIAGNOSIS — N800 Endometriosis of uterus: Secondary | ICD-10-CM | POA: Diagnosis not present

## 2020-04-08 DIAGNOSIS — N950 Postmenopausal bleeding: Secondary | ICD-10-CM | POA: Insufficient documentation

## 2020-04-08 DIAGNOSIS — Z7984 Long term (current) use of oral hypoglycemic drugs: Secondary | ICD-10-CM | POA: Insufficient documentation

## 2020-04-08 DIAGNOSIS — E785 Hyperlipidemia, unspecified: Secondary | ICD-10-CM | POA: Diagnosis not present

## 2020-04-08 DIAGNOSIS — Z881 Allergy status to other antibiotic agents status: Secondary | ICD-10-CM | POA: Diagnosis not present

## 2020-04-08 DIAGNOSIS — N72 Inflammatory disease of cervix uteri: Secondary | ICD-10-CM | POA: Insufficient documentation

## 2020-04-08 DIAGNOSIS — I10 Essential (primary) hypertension: Secondary | ICD-10-CM | POA: Diagnosis not present

## 2020-04-08 DIAGNOSIS — Z6835 Body mass index (BMI) 35.0-35.9, adult: Secondary | ICD-10-CM | POA: Diagnosis not present

## 2020-04-08 DIAGNOSIS — Z888 Allergy status to other drugs, medicaments and biological substances status: Secondary | ICD-10-CM | POA: Insufficient documentation

## 2020-04-08 DIAGNOSIS — D261 Other benign neoplasm of corpus uteri: Secondary | ICD-10-CM | POA: Insufficient documentation

## 2020-04-08 DIAGNOSIS — Z79899 Other long term (current) drug therapy: Secondary | ICD-10-CM | POA: Diagnosis not present

## 2020-04-08 HISTORY — PX: LAPAROSCOPIC BILATERAL SALPINGECTOMY: SHX5889

## 2020-04-08 HISTORY — PX: LAPAROSCOPIC HYSTERECTOMY: SHX1926

## 2020-04-08 LAB — GLUCOSE, CAPILLARY
Glucose-Capillary: 120 mg/dL — ABNORMAL HIGH (ref 70–99)
Glucose-Capillary: 170 mg/dL — ABNORMAL HIGH (ref 70–99)

## 2020-04-08 LAB — POCT PREGNANCY, URINE: Preg Test, Ur: NEGATIVE

## 2020-04-08 LAB — ABO/RH: ABO/RH(D): O POS

## 2020-04-08 SURGERY — HYSTERECTOMY, TOTAL, LAPAROSCOPIC
Anesthesia: General

## 2020-04-08 MED ORDER — FENTANYL CITRATE (PF) 100 MCG/2ML IJ SOLN
INTRAMUSCULAR | Status: AC
  Filled 2020-04-08: qty 2

## 2020-04-08 MED ORDER — LIDOCAINE HCL (CARDIAC) PF 100 MG/5ML IV SOSY
PREFILLED_SYRINGE | INTRAVENOUS | Status: DC | PRN
  Administered 2020-04-08: 100 mg via INTRAVENOUS

## 2020-04-08 MED ORDER — SCOPOLAMINE 1 MG/3DAYS TD PT72: 1.0000 | MEDICATED_PATCH | TRANSDERMAL | Status: DC

## 2020-04-08 MED ORDER — OXYCODONE HCL 5 MG PO TABS
ORAL_TABLET | ORAL | Status: AC
  Filled 2020-04-08: qty 1

## 2020-04-08 MED ORDER — MIDAZOLAM HCL 2 MG/2ML IJ SOLN
INTRAMUSCULAR | Status: DC | PRN
  Administered 2020-04-08: 2 mg via INTRAVENOUS

## 2020-04-08 MED ORDER — OXYCODONE HCL 5 MG PO TABS
5.0000 mg | ORAL_TABLET | Freq: Once | ORAL | Status: AC | PRN
  Administered 2020-04-08: 5 mg via ORAL

## 2020-04-08 MED ORDER — MIDAZOLAM HCL 2 MG/2ML IJ SOLN
INTRAMUSCULAR | Status: AC
  Filled 2020-04-08: qty 2

## 2020-04-08 MED ORDER — PROPOFOL 10 MG/ML IV BOLUS
INTRAVENOUS | Status: AC
  Filled 2020-04-08: qty 20

## 2020-04-08 MED ORDER — ACETAMINOPHEN 500 MG PO TABS: 1000.0000 mg | ORAL_TABLET | ORAL | Status: AC

## 2020-04-08 MED ORDER — KETOROLAC TROMETHAMINE 30 MG/ML IJ SOLN
INTRAMUSCULAR | Status: AC
  Filled 2020-04-08: qty 1

## 2020-04-08 MED ORDER — METOPROLOL TARTRATE 5 MG/5ML IV SOLN
INTRAVENOUS | Status: DC | PRN
Start: 2020-04-08 — End: 2020-04-08
  Administered 2020-04-08: 3 mg via INTRAVENOUS
  Administered 2020-04-08: 2 mg via INTRAVENOUS

## 2020-04-08 MED ORDER — CEFAZOLIN SODIUM-DEXTROSE 2-4 GM/100ML-% IV SOLN
2.0000 g | INTRAVENOUS | Status: AC
  Administered 2020-04-08: 2 g via INTRAVENOUS

## 2020-04-08 MED ORDER — FENTANYL CITRATE (PF) 100 MCG/2ML IJ SOLN: 25.0000 ug | INTRAMUSCULAR | Status: DC | PRN

## 2020-04-08 MED ORDER — DEXAMETHASONE SODIUM PHOSPHATE 10 MG/ML IJ SOLN
INTRAMUSCULAR | Status: DC | PRN
  Administered 2020-04-08: 5 mg via INTRAVENOUS

## 2020-04-08 MED ORDER — METOPROLOL TARTRATE 5 MG/5ML IV SOLN
INTRAVENOUS | Status: AC
  Filled 2020-04-08: qty 5

## 2020-04-08 MED ORDER — SUCCINYLCHOLINE CHLORIDE 200 MG/10ML IV SOSY
PREFILLED_SYRINGE | INTRAVENOUS | Status: AC
  Filled 2020-04-08: qty 10

## 2020-04-08 MED ORDER — ONDANSETRON HCL 4 MG/2ML IJ SOLN
INTRAMUSCULAR | Status: AC
  Filled 2020-04-08: qty 2

## 2020-04-08 MED ORDER — GABAPENTIN 300 MG PO CAPS
ORAL_CAPSULE | ORAL | Status: AC
  Administered 2020-04-08: 300 mg via ORAL
  Filled 2020-04-08: qty 1

## 2020-04-08 MED ORDER — BUPIVACAINE HCL 0.5 % IJ SOLN
INTRAMUSCULAR | Status: DC | PRN
  Administered 2020-04-08: 14 mL

## 2020-04-08 MED ORDER — ROCURONIUM BROMIDE 10 MG/ML (PF) SYRINGE
PREFILLED_SYRINGE | INTRAVENOUS | Status: AC
  Filled 2020-04-08: qty 10

## 2020-04-08 MED ORDER — OXYCODONE HCL 5 MG PO TABS: 5.0000 mg | ORAL_TABLET | ORAL | 0 refills | Status: AC | PRN

## 2020-04-08 MED ORDER — ACETAMINOPHEN 500 MG PO TABS
1000.0000 mg | ORAL_TABLET | Freq: Four times a day (QID) | ORAL | 0 refills | Status: AC
Start: 2020-04-08 — End: 2020-04-11

## 2020-04-08 MED ORDER — DEXAMETHASONE SODIUM PHOSPHATE 10 MG/ML IJ SOLN
INTRAMUSCULAR | Status: AC
  Filled 2020-04-08: qty 1

## 2020-04-08 MED ORDER — PROPOFOL 10 MG/ML IV BOLUS
INTRAVENOUS | Status: DC | PRN
  Administered 2020-04-08: 160 mg via INTRAVENOUS

## 2020-04-08 MED ORDER — OXYCODONE HCL 5 MG/5ML PO SOLN: 5.0000 mg | Freq: Once | ORAL | Status: AC | PRN

## 2020-04-08 MED ORDER — KETOROLAC TROMETHAMINE 30 MG/ML IJ SOLN
INTRAMUSCULAR | Status: DC | PRN
  Administered 2020-04-08: 30 mg via INTRAVENOUS

## 2020-04-08 MED ORDER — ONDANSETRON HCL 4 MG/2ML IJ SOLN
INTRAMUSCULAR | Status: DC | PRN
  Administered 2020-04-08: 4 mg via INTRAVENOUS

## 2020-04-08 MED ORDER — LIDOCAINE HCL (PF) 2 % IJ SOLN
INTRAMUSCULAR | Status: AC
  Filled 2020-04-08: qty 5

## 2020-04-08 MED ORDER — GABAPENTIN 300 MG PO CAPS: 300.0000 mg | ORAL_CAPSULE | ORAL | Status: AC

## 2020-04-08 MED ORDER — GABAPENTIN 800 MG PO TABS: 800.0000 mg | ORAL_TABLET | Freq: Every day | ORAL | 0 refills | Status: AC

## 2020-04-08 MED ORDER — SCOPOLAMINE 1 MG/3DAYS TD PT72
MEDICATED_PATCH | TRANSDERMAL | Status: AC
  Administered 2020-04-08: 1.5 mg via TRANSDERMAL
  Filled 2020-04-08: qty 1

## 2020-04-08 MED ORDER — DOCUSATE SODIUM 100 MG PO CAPS: 100.0000 mg | ORAL_CAPSULE | Freq: Two times a day (BID) | ORAL | 0 refills | Status: AC

## 2020-04-08 MED ORDER — SUCCINYLCHOLINE CHLORIDE 20 MG/ML IJ SOLN
INTRAMUSCULAR | Status: DC | PRN
  Administered 2020-04-08: 160 mg via INTRAVENOUS

## 2020-04-08 MED ORDER — BUPIVACAINE HCL (PF) 0.5 % IJ SOLN
INTRAMUSCULAR | Status: AC
  Filled 2020-04-08: qty 30

## 2020-04-08 MED ORDER — SODIUM CHLORIDE 0.9 % IV SOLN: INTRAVENOUS | Status: DC

## 2020-04-08 MED ORDER — SUGAMMADEX SODIUM 200 MG/2ML IV SOLN
INTRAVENOUS | Status: DC | PRN
  Administered 2020-04-08: 200 mg via INTRAVENOUS

## 2020-04-08 MED ORDER — ACETAMINOPHEN 500 MG PO TABS
ORAL_TABLET | ORAL | Status: AC
  Administered 2020-04-08: 1000 mg via ORAL
  Filled 2020-04-08: qty 2

## 2020-04-08 MED ORDER — CEFAZOLIN SODIUM-DEXTROSE 2-4 GM/100ML-% IV SOLN
INTRAVENOUS | Status: AC
  Filled 2020-04-08: qty 100

## 2020-04-08 MED ORDER — IBUPROFEN 800 MG PO TABS: 800.0000 mg | ORAL_TABLET | Freq: Three times a day (TID) | ORAL | 1 refills | Status: AC

## 2020-04-08 MED ORDER — FENTANYL CITRATE (PF) 100 MCG/2ML IJ SOLN
INTRAMUSCULAR | Status: DC | PRN
  Administered 2020-04-08 (×4): 50 ug via INTRAVENOUS

## 2020-04-08 MED ORDER — ROCURONIUM BROMIDE 100 MG/10ML IV SOLN
INTRAVENOUS | Status: DC | PRN
  Administered 2020-04-08: 30 mg via INTRAVENOUS
  Administered 2020-04-08 (×2): 10 mg via INTRAVENOUS

## 2020-04-08 SURGICAL SUPPLY — 54 items
BAG URINE DRAIN 2000ML AR STRL (UROLOGICAL SUPPLIES) ×8 IMPLANT
BLADE SURG SZ11 CARB STEEL (BLADE) ×4 IMPLANT
CATH FOLEY 2WAY  5CC 16FR (CATHETERS) ×2
CATH URTH 16FR FL 2W BLN LF (CATHETERS) ×2 IMPLANT
CHLORAPREP W/TINT 26 (MISCELLANEOUS) ×4 IMPLANT
CORD MONOPOLAR M/FML 12FT (MISCELLANEOUS) ×4 IMPLANT
COUNTER NEEDLE 20/40 LG (NEEDLE) ×4 IMPLANT
COVER LIGHT HANDLE STERIS (MISCELLANEOUS) ×8 IMPLANT
COVER WAND RF STERILE (DRAPES) ×4 IMPLANT
DERMABOND ADVANCED (GAUZE/BANDAGES/DRESSINGS) ×2
DERMABOND ADVANCED .7 DNX12 (GAUZE/BANDAGES/DRESSINGS) ×2 IMPLANT
DEVICE SUTURE ENDOST 10MM (ENDOMECHANICALS) ×4 IMPLANT
DRAPE GENERAL ENDO 106X123.5 (DRAPES) ×4 IMPLANT
DRSG TEGADERM 2-3/8X2-3/4 SM (GAUZE/BANDAGES/DRESSINGS) ×12 IMPLANT
GLOVE BIO SURGEON STRL SZ7 (GLOVE) ×12 IMPLANT
GLOVE INDICATOR 7.5 STRL GRN (GLOVE) ×4 IMPLANT
GOWN STRL REUS W/ TWL LRG LVL3 (GOWN DISPOSABLE) ×4 IMPLANT
GOWN STRL REUS W/ TWL XL LVL3 (GOWN DISPOSABLE) ×2 IMPLANT
GOWN STRL REUS W/TWL LRG LVL3 (GOWN DISPOSABLE) ×4
GOWN STRL REUS W/TWL XL LVL3 (GOWN DISPOSABLE) ×2
GRASPER SUT TROCAR 14GX15 (MISCELLANEOUS) ×4 IMPLANT
IRRIGATION STRYKERFLOW (MISCELLANEOUS) ×2 IMPLANT
IRRIGATOR STRYKERFLOW (MISCELLANEOUS) ×4
KIT PINK PAD W/HEAD ARE REST (MISCELLANEOUS) ×4
KIT PINK PAD W/HEAD ARM REST (MISCELLANEOUS) ×2 IMPLANT
KIT TURNOVER CYSTO (KITS) ×4 IMPLANT
L-HOOK LAP DISP 36CM (ELECTROSURGICAL) ×4
LABEL OR SOLS (LABEL) ×4 IMPLANT
LHOOK LAP DISP 36CM (ELECTROSURGICAL) ×2 IMPLANT
LIGASURE VESSEL 5MM BLUNT TIP (ELECTROSURGICAL) ×4 IMPLANT
MANIPULATOR VCARE LG CRV RETR (MISCELLANEOUS) IMPLANT
MANIPULATOR VCARE SML CRV RETR (MISCELLANEOUS) IMPLANT
MANIPULATOR VCARE STD CRV RETR (MISCELLANEOUS) ×4 IMPLANT
NS IRRIG 500ML POUR BTL (IV SOLUTION) ×4 IMPLANT
OCCLUDER COLPOPNEUMO (BALLOONS) ×4 IMPLANT
PACK GYN LAPAROSCOPIC (MISCELLANEOUS) ×4 IMPLANT
PAD OB MATERNITY 4.3X12.25 (PERSONAL CARE ITEMS) ×4 IMPLANT
PAD PREP 24X41 OB/GYN DISP (PERSONAL CARE ITEMS) ×4 IMPLANT
PENCIL ELECTRO HAND CTR (MISCELLANEOUS) ×4 IMPLANT
SCISSORS METZENBAUM CVD 33 (INSTRUMENTS) IMPLANT
SET CYSTO W/LG BORE CLAMP LF (SET/KITS/TRAYS/PACK) IMPLANT
SLEEVE ENDOPATH XCEL 5M (ENDOMECHANICALS) ×4 IMPLANT
SPONGE GAUZE 2X2 8PLY STER LF (GAUZE/BANDAGES/DRESSINGS) ×2
SPONGE GAUZE 2X2 8PLY STRL LF (GAUZE/BANDAGES/DRESSINGS) ×6 IMPLANT
SUT ENDO VLOC 180-0-8IN (SUTURE) ×4 IMPLANT
SUT MNCRL 4-0 (SUTURE) ×2
SUT MNCRL 4-0 27XMFL (SUTURE) ×2
SUT VIC AB 0 CT1 36 (SUTURE) ×8 IMPLANT
SUTURE MNCRL 4-0 27XMF (SUTURE) ×2 IMPLANT
SYR 10ML LL (SYRINGE) ×4 IMPLANT
SYR 50ML LL SCALE MARK (SYRINGE) ×4 IMPLANT
TROCAR ENDO BLADELESS 11MM (ENDOMECHANICALS) ×4 IMPLANT
TROCAR XCEL NON-BLD 5MMX100MML (ENDOMECHANICALS) ×4 IMPLANT
TUBING EVAC SMOKE HEATED PNEUM (TUBING) ×4 IMPLANT

## 2020-04-08 NOTE — Transfer of Care (Signed)
Immediate Anesthesia Transfer of Care Note  Patient: Jessica Houston  Procedure(s) Performed: HYSTERECTOMY TOTAL LAPAROSCOPIC (N/A ) LAPAROSCOPIC BILATERAL SALPINGECTOMY  Patient Location: PACU  Anesthesia Type:General  Level of Consciousness: sedated  Airway & Oxygen Therapy: Patient Spontanous Breathing and Patient connected to nasal cannula oxygen  Post-op Assessment: Report given to RN  Post vital signs: stable  Last Vitals:  Vitals Value Taken Time  BP 140/76 04/08/20 1624  Temp    Pulse 79 04/08/20 1624  Resp 10 04/08/20 1624  SpO2 100 % 04/08/20 1624  Vitals shown include unvalidated device data.  Last Pain:  Vitals:   04/08/20 1213  TempSrc: Oral  PainSc: 0-No pain         Complications: No apparent anesthesia complications

## 2020-04-08 NOTE — Op Note (Signed)
Jessica Houston PROCEDURE DATE: 04/08/2020  PREOPERATIVE DIAGNOSIS: Postmenopausal bleeding POSTOPERATIVE DIAGNOSIS: The same PROCEDURE:  HYSTERECTOMY TOTAL LAPAROSCOPIC:  LAPAROSCOPIC BILATERAL SALPINGECTOMY:   SURGEON:  Dr. Benjaman Kindler ASSISTANT: Dr. Laverta Baltimore  Anesthesiologist:  Anesthesiologist: Emmie Niemann, MD; Piscitello, Precious Haws, MD; Arita Miss, MD CRNA: Lerry Liner, CRNA; Zetta Bills, CRNA  INDICATIONS: 54 y.o. F  here for definitive surgical management secondary to the indications listed under preoperative diagnoses; please see preoperative note for further details.  Risks of surgery were discussed with the patient including but not limited to: bleeding which may require transfusion or reoperation; infection which may require antibiotics; injury to bowel, bladder, ureters or other surrounding organs; need for additional procedures; thromboembolic phenomenon, incisional problems and other postoperative/anesthesia complications. Written informed consent was obtained.    FINDINGS: Normal vagina and perineum.  Normal upper abdomen.  Small but bulbous uterus, with bilateral tubes and ovaries that were unremarkable.  Both ureters were visualized at the beginning and the end of the case.   ANESTHESIA:    General INTRAVENOUS FLUIDS:see anesthesia ESTIMATED BLOOD LOSS: 75 ml URINE OUTPUT: see anesthesia   SPECIMENS: Uterus, cervix, bilateral fallopian tubes  COMPLICATIONS: None immediate  PROCEDURE IN DETAIL:  The patient received prophalactic intravenous antibiotics and had sequential compression devices applied to her lower extremities while in the preoperative area.  She was then taken to the operating room where general anesthesia was administered and was found to be adequate.  She was placed in the dorsal lithotomy position, and was prepped and draped in a sterile manner.  A formal time out was performed with all team members present and in agreement.  A  V-care uterine manipulator was placed at this time.  A Foley catheter was inserted into her bladder and attached to constant drainage. Attention was turned to the abdomen and 0.5% Marcaine infused subq. A 87mm umbilical incision was made with the scalpel.  The Optiview 5-mm trocar and sleeve were then advanced without difficulty with the laparoscope under direct visualization into the abdomen.  The abdomen was then insufflated with carbon dioxide gas and adequate pneumoperitoneum was obtained.  A survey of the patient's pelvis and abdomen revealed the findings above.  Bilateral lower quadrant ports (5 mm on the right and 11 mm on the left) were then placed under direct visualization.  The pelvis was then carefully examined.  Attention was turned to the fallopian tubes; these were freed from the underlying mesosalpinx and the uterine attachments using the Ligasure device.  The bilateral round and broad ligaments were then clamped and transected with the Ligasure device.  The uterine artery was then skeletonized and a bladder flap was created.  The ureters were noted to be safely away from the area of dissection.  The bladder was then bluntly dissected off the lower uterine segment.    At this point, attention was turned to the uterine vessels, which were clamped and cauterized using the Ligasure on the left, and then the right. After the uterine blood flow at the level of the internal os was controlled, both arteries were cut with the Ligasure.  Good hemostasis was noted overall.  The uterosacral and cardinal ligaments were clamped, cut and ligated bilaterally .  Attention was then turned to the cervicovaginal junction, and monopolar scissors were used to transect the cervix from the surrounding vagina using the ring of the V-care as a guide. This was done circumferentially allowing total hysterectomy.  The uterus was then removed from the vagina  and the vaginal cuff incision was then closed with running V-loc  suture.  Overall excellent hemostasis was noted.    Attention was returned to the abdomen.The ureters were reexamined bilaterally and were pulsating normally. The abdominal pressure was reduced and hemostasis was confirmed. 1g Arista placed over bilateral angles at areas that appeared oozy.  Cystoscopy showed bilateral ureteral jets.  No stitches were visualized in the bladder during cystoscopy.  The 62mm port fascia was closed with a vertical mattress with 0-Vicryl, using the cone closure system. All trocars were removed under direct visualization, and the abdomen was desufflated.  All skin incisions were closed with 4-0 Vicryl subcuticular stitches and Dermabond. The patient tolerated the procedures well.  All instruments, needles, and sponge counts were correct x 2. The patient was taken to the recovery room awake, extubated and in stable condition.

## 2020-04-08 NOTE — H&P (Signed)
Jessica Houston is a 53 y.o. female here for discuss results and determine next step.  Referring provider: Self  History of Present Illness: Patient is an established patient who returns for a discussion regarding D&C Hysteroscopy vs Hysterectomy for her history of menorrhagia with anemia and pelvic pain/pressure. Her LMP was about 1 yr ago. She has been concerned for malignancy in the past due to her symptoms and does have significant anxiety in this subject.   Work up: Pap smear 08/2019: neg/neg  EMBx in 08/2019: suggestive of benign endometrial polyp, mixed with benign endocervical tissue with squamous metaplasia   TVUS 08/2019: Ut wnl Ut=7.70 x 5.19 x 6.93 cm  Endometrium=11.67 mm Rt ov wnl Lt simple ov cyst= 1.51 cm  Past Medical History:  has a past medical history of Allergic rhinitis, Anxiety about blushing, Asthma without status asthmaticus, unspecified, Bronchitis, Essential hypertension, benign (05/10/2014), Obesity, Other and unspecified hyperlipidemia (05/10/2014), Pelvic pain, Reflux esophagitis, Sleep apnea, Type II or unspecified type diabetes mellitus without mention of complication, not stated as uncontrolled (CMS-HCC) (05/10/2014), and Vitamin D deficiency disease.  Past Surgical History:  has a past surgical history that includes Cesarean section; Tubal ligation; colonoscopy (04/26/2009); egd (11/07/2007); Skin cancer; Colonoscopy (03/07/2015); and egd (03/07/2015). Family History: family history includes Diabetes in her father; Heart disease in her father; High blood pressure (Hypertension) in her father; Hyperlipidemia (Elevated cholesterol) in her mother and sister. Social History:  reports that she has never smoked. She has never used smokeless tobacco. She reports that she does not drink alcohol or use drugs. OB/GYN History:          OB History    Gravida  3   Para  3   Term  3   Preterm      AB      Living  3     SAB      TAB      Ectopic       Molar      Multiple      Live Births           Allergies: is allergic to ace inhibitors; ciprofloxacin; codeine; and saxagliptin. Medications: Current Outpatient Medications:  .  bisoproloL-hydrochlorothiazide (ZIAC) 5-6.25 mg tablet, Take 1 tablet by mouth once daily, Disp: 30 tablet, Rfl: 11 .  blood glucose diagnostic (FREESTYLE LITE STRIPS) test strip, Patient is testing at home 1-2 times daily. Dx code 250.0, Disp: , Rfl:  .  blood glucose meter (FREESTYLE FREEDOM LITE) kit, Please dispense home testing glucometer for patient to use at home. Testing 1-2 times a day. Dx code 250.0, Disp: , Rfl:  .  calcium carbonate (TUMS) 200 mg calcium (500 mg) chewable tablet, Take by mouth., Disp: , Rfl:  .  cetirizine (ZYRTEC) 10 MG tablet, Take by mouth nightly, Disp: , Rfl:  .  clotrimazole-betamethasone (LOTRISONE) 1-0.05 % cream, apply to affected area(s) twice daily, PRN, Disp: , Rfl:  .  diclofenac (VOLTAREN) 1 % topical gel, APP 4 GRAMS EXT AA QID, Disp: , Rfl: 3 .  EPINEPHrine (EPIPEN) 0.3 mg/0.3 mL auto-injector, , Disp: , Rfl:  .  ergocalciferol, vitamin D2, 50,000 unit capsule, Once/week, Disp: , Rfl:  .  ferrous sulfate 325 (65 FE) MG tablet, Take 325 mg by mouth daily with breakfast. Reported on 12/13/2015 , Disp: , Rfl:  .  fexofenadine (ALLEGRA) 180 MG tablet, Take by mouth, Disp: , Rfl:  .  FLOVENT HFA 110 mcg/actuation inhaler, Inhale 1 inhalation into the lungs  once daily., Disp: , Rfl: 10 .  lancets (ONETOUCH ULTRASOFT), 1 each by Other route 2 (two) times daily. Dispense one touch nano lancets, Disp: , Rfl:  .  losartan (COZAAR) 100 MG tablet, Take 1 tablet (100 mg total) by mouth once daily, Disp: 90 tablet, Rfl: 1 .  metFORMIN (GLUCOPHAGE) 500 MG tablet, Take 1,000 mg by mouth 2 (two) times daily with meals.  , Disp: , Rfl:  .  pantoprazole (PROTONIX) 40 MG DR tablet, Take 1 tablet (40 mg total) by mouth once daily, Disp: 90 tablet, Rfl: 0 .  TRULICITY 4.09 WJ/1.9 mL pen  injector, INJECT 0.5 MLS (0.75 MG TOTAL) UNDER THE SKIN ONCE WEEKLY, Disp: 3 mL, Rfl: 4  Review of Systems: No SOB, no palpitations or chest pain, no new lower extremity edema, no nausea or vomiting or bowel or bladder complaints. See HPI for gyn specific ROS.   Exam:      Vitals:   02/23/20 1417  BP: 138/82    WD female in NAD Lungs: CTA  CV : RRR without murmur   Breast: deferred Neck: No thyromegaly Skin: No rashes, ulcers or skin lesions noted. No excessive hirsutism or acne noted.                                                  Neurological: Appears alert and oriented and is a good historian. No gross abnormalities are noted.                     Psychological: Normal affect and mood. No signs of anxiety or depression noted.                                                           Abdomen: Soft , no mass, normal active bowel sounds,  non-tender, no rebound tenderness       Pelvic:  deferred  Impression:   The primary encounter diagnosis was Pelvic pain in female. A diagnosis of Endometrial polyp was also pertinent to this visit.  Plan:    Planned total laparoscopic hysterectomy with bilateral salpingectomy  procedure.  We may perform a cystoscopy to evaluate the urinary tract after the procedure, if surgically indicated for uro tract integrity.   The patient and I discussed the technical aspects of the procedure including the potential for risks and complications.  These include but are not limited to the risk of infection requiring post-operative antibiotics or further procedures.  We talked about the risk of injury to adjacent organs including bladder, bowel, ureter, blood vessels or nerves.  We talked about the need to convert to an open incision.  We talked about the possible need for blood transfusion.  We talked about postop complications such as thromboembolic or cardiopulmonary complications.  All of her questions were answered.   - if no improvement with TLH  for pelvic pressure and back pain, consider PFPT and physiatry.   Diagnoses and all orders for this visit:  Pelvic pain in female  Endometrial polyp

## 2020-04-08 NOTE — Discharge Instructions (Signed)
Discharge instructions after   total laparoscopic hysterectomy   For the next three days, take ibuprofen and acetaminophen on a schedule, every 8 hours. You can take them together or you can intersperse them, and take one every four hours. I also gave you gabapentin for nighttime, to help you sleep and also to control pain. Take gabapentin medicines at night for at least the next 3 nights. You also have a narcotic, oxycodone, to take as needed if the above medicines don't help.  Postop constipation is a major cause of pain. Stay well hydrated, walk as you tolerate, and take over the counter senna as well as stool softeners if you need them.    Signs and Symptoms to Report Call our office at [phone] if you have any of the following.  . Fever over 100.4 degrees or higher . Severe stomach pain not relieved with pain medications . Bright red bleeding that's heavier than a period that does not slow with rest . To go the bathroom a lot (frequency), you can't hold your urine (urgency), or it hurts when you empty your bladder (urinate) . Chest pain . Shortness of breath . Pain in the calves of your legs . Severe nausea and vomiting not relieved with anti-nausea medications . Signs of infection around your wounds, such as redness, hot to touch, swelling, green/yellow drainage (like pus), bad smelling discharge . Any concerns  What You Can Expect after Surgery . You may see some pink tinged, bloody fluid and bruising around the wound. This is normal. . You may notice shoulder and neck pain. This is caused by the gas used during surgery to expand your abdomen so your surgeon could get to the uterus easier. . You may have a sore throat because of the tube in your mouth during general anesthesia. This will go away in 2 to 3 days. . You may have some stomach cramps. . You may notice spotting on your panties. . You may have pain around the incision sites.   Activities after Your  Discharge Follow these guidelines to help speed your recovery at home: . Do the coughing and deep breathing as you did in the hospital for 2 weeks. Use the small blue breathing device, called the incentive spirometer for 2 weeks. . Don't drive if you are in pain or taking narcotic pain medicine. You may drive when you can safely slam on the brakes, turn the wheel forcefully, and rotate your torso comfortably. This is typically 1-2 weeks. Practice in a parking lot or side street prior to attempting to drive regularly.  . Ask others to help with household chores for 4 weeks. . Do not lift anything heavier that 10 pounds for 4-6 weeks. This includes pets, children, and groceries. . Don't do strenuous activities, exercises, or sports like vacuuming, tennis, squash, etc. until your doctor says it is safe to do so. ---Maintain pelvic rest for 8 weeks. This means nothing in the vagina or rectum at all (no douching, tampons, intercourse) for 8 weeks.  . Walk as you feel able. Rest often since it may take two or three weeks for your energy level to return to normal.  . You may climb stairs . Avoid constipation:   -Eat fruits, vegetables, and whole grains. Eat small meals as your appetite will take time to return to normal.   -Drink 6 to 8 glasses of water each day unless your doctor has told you to limit your fluids.   -Use a laxative   or stool softener as needed if constipation becomes a problem. You may take Miralax, metamucil, Citrucil, Colace, Senekot, FiberCon, etc. If this does not relieve the constipation, try two tablespoons of Milk Of Magnesia every 8 hours until your bowels move.  . You may shower. Gently wash the wounds with a mild soap and water. Pat dry. . Do not get in a hot tub, swimming pool, etc. for 6 weeks. . Do not use lotions, oils, powders on the wounds. . Do not douche, use tampons, or have sex until your doctor says it is okay. . Take your pain medicine when you need it. The medicine  may not work as well if the pain is bad.  Take the medicines you were taking before surgery. Other medications you will need are pain medications and possibly constipation and nausea medications (Zofran).    AMBULATORY SURGERY  DISCHARGE INSTRUCTIONS   1) The drugs that you were given will stay in your system until tomorrow so for the next 24 hours you should not:  A) Drive an automobile B) Make any legal decisions C) Drink any alcoholic beverage   2) You may resume regular meals tomorrow.  Today it is better to start with liquids and gradually work up to solid foods.  You may eat anything you prefer, but it is better to start with liquids, then soup and crackers, and gradually work up to solid foods.   3) Please notify your doctor immediately if you have any unusual bleeding, trouble breathing, redness and pain at the surgery site, drainage, fever, or pain not relieved by medication.    4) Additional Instructions:        Please contact your physician with any problems or Same Day Surgery at [phone], Monday through Friday 6 am to 4 pm, or [place] [facility] [place] Main number at [phone]. 

## 2020-04-08 NOTE — Anesthesia Procedure Notes (Signed)
Procedure Name: Intubation Date/Time: 04/08/2020 1:58 PM Performed by: Caryl Asp, CRNA Pre-anesthesia Checklist: Patient identified, Emergency Drugs available, Suction available and Patient being monitored Patient Re-evaluated:Patient Re-evaluated prior to induction Oxygen Delivery Method: Circle system utilized Preoxygenation: Pre-oxygenation with 100% oxygen Induction Type: IV induction Ventilation: Mask ventilation without difficulty Laryngoscope Size: McGraph and 3 Grade View: Grade I Tube type: Oral Tube size: 7.0 mm Number of attempts: 1 Airway Equipment and Method: Stylet Placement Confirmation: ETT inserted through vocal cords under direct vision Secured at: 21 cm Tube secured with: Tape Dental Injury: Teeth and Oropharynx as per pre-operative assessment

## 2020-04-08 NOTE — Anesthesia Preprocedure Evaluation (Signed)
Anesthesia Evaluation  Patient identified by MRN, date of birth, ID band Patient awake    Reviewed: Allergy & Precautions, H&P , NPO status , Patient's Chart, lab work & pertinent test results  History of Anesthesia Complications (+) PONV, Family history of anesthesia reaction and history of anesthetic complications  Airway Mallampati: III  TM Distance: <3 FB Neck ROM: limited    Dental  (+) Chipped   Pulmonary neg shortness of breath, asthma , sleep apnea and Continuous Positive Airway Pressure Ventilation ,    Pulmonary exam normal        Cardiovascular Exercise Tolerance: Good hypertension, (-) angina(-) Past MI and (-) DOE Normal cardiovascular exam     Neuro/Psych PSYCHIATRIC DISORDERS negative neurological ROS     GI/Hepatic Neg liver ROS, GERD  Controlled and Medicated,  Endo/Other  diabetes, Type 2  Renal/GU      Musculoskeletal   Abdominal   Peds  Hematology negative hematology ROS (+)   Anesthesia Other Findings Past Medical History: No date: Anxiety No date: Asthma No date: Bronchitis No date: Cancer [facility])     Comment:  skin No date: Diabetes mellitus     Comment:  HgbA1c 6.8%, diet controlled. Optho 2010, urine               microalbumin 10/2010 No date: Esophageal reflux     Comment:  CONTROLLED ON CURRENT MEDICATION No date: Family history of adverse reaction to anesthesia     Comment:  dad nausea and vomiting No date: Hyperlipidemia No date: Hypertension No date: PONV (postoperative nausea and vomiting)     Comment:  with C-section No date: S/P endoscopy     Comment:  normal per pt. done at [facility] clinic No date: Sleep apnea     Comment:  On CPAP No date: Vitamin D deficiency  Past Surgical History: No date: CESAREAN SECTION; N/A 06/21/2015: HEMORRHOID SURGERY; N/A     Comment:  Procedure: Internal and external hemorrhoidectomy;                Surgeon: Leonie Green, MD;   Location: [facility] ORS;                Service: General;  Laterality: N/A;  BMI    Body Mass Index: 35.69 kg/m      Reproductive/Obstetrics negative OB ROS                             Anesthesia Physical Anesthesia Plan  ASA: III  Anesthesia Plan: General ETT   Post-op Pain Management:    Induction: Intravenous  PONV Risk Score and Plan: Ondansetron, Dexamethasone, Midazolam and Treatment may vary due to age or medical condition  Airway Management Planned: Oral ETT  Additional Equipment:   Intra-op Plan:   Post-operative Plan: Extubation in OR  Informed Consent: I have reviewed the patients History and Physical, chart, labs and discussed the procedure including the risks, benefits and alternatives for the proposed anesthesia with the patient or authorized representative who has indicated his/her understanding and acceptance.     Dental Advisory Given  Plan Discussed with: Anesthesiologist, CRNA and Surgeon  Anesthesia Plan Comments: (Patient consented for risks of anesthesia including but not limited to:  - adverse reactions to medications - damage to eyes, teeth, lips or other oral mucosa - nerve damage due to positioning  - sore throat or hoarseness - Damage to heart, brain, nerves, lungs or loss of life  Patient voiced understanding.)        Anesthesia Quick Evaluation

## 2020-04-09 NOTE — Anesthesia Postprocedure Evaluation (Signed)
Anesthesia Post Note  Patient: Jessica Houston  Procedure(s) Performed: HYSTERECTOMY TOTAL LAPAROSCOPIC (N/A ) LAPAROSCOPIC BILATERAL SALPINGECTOMY  Patient location during evaluation: PACU Anesthesia Type: General Level of consciousness: awake and alert Pain management: pain level controlled Vital Signs Assessment: post-procedure vital signs reviewed and stable Respiratory status: spontaneous breathing, nonlabored ventilation, respiratory function stable and patient connected to nasal cannula oxygen Cardiovascular status: blood pressure returned to baseline and stable Postop Assessment: no apparent nausea or vomiting Anesthetic complications: no     Last Vitals:  Vitals:   04/08/20 1754 04/08/20 1809  BP:  (!) 155/81  Pulse:  83  Resp:  16  Temp: (!) 36.3 C (!) 36.2 C  SpO2:  98%    Last Pain:  Vitals:   04/08/20 1809  TempSrc: Temporal  PainSc: 3                  Arita Miss

## 2020-04-13 LAB — SURGICAL PATHOLOGY

## 2020-09-23 ENCOUNTER — Other Ambulatory Visit: Payer: Self-pay | Admitting: Family

## 2020-10-26 ENCOUNTER — Other Ambulatory Visit: Payer: Self-pay | Admitting: Internal Medicine

## 2020-10-26 DIAGNOSIS — Z1231 Encounter for screening mammogram for malignant neoplasm of breast: Secondary | ICD-10-CM

## 2020-11-14 ENCOUNTER — Ambulatory Visit
Admission: RE | Admit: 2020-11-14 | Discharge: 2020-11-14 | Disposition: A | Discharge status: Home / Self Care | Payer: BC Managed Care – PPO | Source: Ambulatory Visit | Attending: Internal Medicine | Admitting: Internal Medicine

## 2020-11-14 ENCOUNTER — Other Ambulatory Visit: Payer: Self-pay

## 2020-11-14 DIAGNOSIS — Z1231 Encounter for screening mammogram for malignant neoplasm of breast: Secondary | ICD-10-CM | POA: Diagnosis present

## 2021-03-23 ENCOUNTER — Other Ambulatory Visit: Payer: Self-pay | Admitting: Family

## 2021-05-01 ENCOUNTER — Other Ambulatory Visit: Payer: Self-pay | Admitting: Family

## 2021-07-11 ENCOUNTER — Ambulatory Visit: Payer: BC Managed Care – PPO | Admitting: Dermatology

## 2021-07-18 ENCOUNTER — Ambulatory Visit: Payer: Managed Care, Other (non HMO) | Admitting: Dermatology

## 2021-07-18 ENCOUNTER — Other Ambulatory Visit: Payer: Self-pay

## 2021-07-18 DIAGNOSIS — L578 Other skin changes due to chronic exposure to nonionizing radiation: Secondary | ICD-10-CM

## 2021-07-18 DIAGNOSIS — L570 Actinic keratosis: Secondary | ICD-10-CM | POA: Diagnosis not present

## 2021-07-18 DIAGNOSIS — L821 Other seborrheic keratosis: Secondary | ICD-10-CM | POA: Diagnosis not present

## 2021-07-18 DIAGNOSIS — L820 Inflamed seborrheic keratosis: Secondary | ICD-10-CM

## 2021-07-18 DIAGNOSIS — I8310 Varicose veins of unspecified lower extremity with inflammation: Secondary | ICD-10-CM | POA: Diagnosis not present

## 2021-07-18 DIAGNOSIS — L814 Other melanin hyperpigmentation: Secondary | ICD-10-CM | POA: Diagnosis not present

## 2021-07-18 NOTE — Patient Instructions (Signed)

## 2021-07-18 NOTE — Progress Notes (Signed)
Follow-Up Visit   Subjective  Jessica Houston is a 55 y.o. female who presents for the following: irregular skin lesions (On the face, chest, shoulders, and arms - patient is concerned and would like them checked today. ) and veins (Of the B/L leg - patient would like to discuss sclerotherapy).  The following portions of the chart were reviewed this encounter and updated as appropriate:   Tobacco  Allergies  Meds  Problems  Med Hx  Surg Hx  Fam Hx     Review of Systems:  No other skin or systemic complaints except as noted in HPI or Assessment and Plan.  Objective  Well appearing patient in no apparent distress; mood and affect are within normal limits.  A focused examination was performed including face, trunk, extremities. Relevant physical exam findings are noted in the Assessment and Plan.   Assessment & Plan  Inflamed seborrheic keratosis L chest x 1, L cheek x 1, R scalp x 1, R top of deltoid x 2, R lat deltoid x 1, R forearm x 1  Destruction of lesion - L chest x 1, L cheek x 1, R scalp x 1, R top of deltoid x 2, R lat deltoid x 1, R forearm x 1 Complexity: simple   Destruction method: cryotherapy   Informed consent: discussed and consent obtained   Timeout:  patient name, date of birth, surgical site, and procedure verified Lesion destroyed using liquid nitrogen: Yes   Region frozen until ice ball extended beyond lesion: Yes   Outcome: patient tolerated procedure well with no complications   Post-procedure details: wound care instructions given    AK (actinic keratosis) L forehead x 1  Destruction of lesion - L forehead x 1 Complexity: simple   Destruction method: cryotherapy   Informed consent: discussed and consent obtained   Timeout:  patient name, date of birth, surgical site, and procedure verified Lesion destroyed using liquid nitrogen: Yes   Region frozen until ice ball extended beyond lesion: Yes   Outcome: patient tolerated procedure well with no  complications   Post-procedure details: wound care instructions given    Seborrheic Keratoses - Stuck-on, waxy, tan-brown papules and/or plaques  - Benign-appearing - Discussed benign etiology and prognosis. - Observe - Call for any changes  Actinic Damage - chronic, secondary to cumulative UV radiation exposure/sun exposure over time - diffuse scaly erythematous macules with underlying dyspigmentation - Recommend daily broad spectrum sunscreen SPF 30+ to sun-exposed areas, reapply every 2 hours as needed.  - Recommend staying in the shade or wearing long sleeves, sun glasses (UVA+UVB protection) and wide brim hats (4-inch brim around the entire circumference of the hat). - Call for new or changing lesions.  Lentigines - Scattered tan macules - Due to sun exposure - Benign-appering, observe - Recommend daily broad spectrum sunscreen SPF 30+ to sun-exposed areas, reapply every 2 hours as needed. - Call for any changes  Varicose Veins/Spider Veins - Dilated blue, purple or red veins at the lower extremities - Reassured - Smaller vessels can be treated by sclerotherapy (a procedure to inject a medicine into the veins to make them disappear) if desired, but the treatment is not covered by insurance. Larger vessels may be covered if symptomatic and we would refer to vascular surgeon if treatment desired.  Return if symptoms worsen or fail to improve; sclerotherapy.  Luther Redo, CMA, am acting as scribe for Sarina Ser, MD . Documentation: I have reviewed the above documentation for accuracy  and completeness, and I agree with the above.  Sarina Ser, MD

## 2021-07-19 ENCOUNTER — Encounter: Payer: Self-pay | Admitting: Dermatology

## 2021-07-25 ENCOUNTER — Encounter: Payer: Self-pay | Admitting: Dermatology

## 2021-07-26 MED ORDER — DOXYCYCLINE HYCLATE 100 MG PO TABS: ORAL_TABLET | ORAL | 0 refills | Status: AC

## 2021-08-23 ENCOUNTER — Other Ambulatory Visit: Payer: Self-pay | Admitting: Family

## 2021-10-18 ENCOUNTER — Other Ambulatory Visit: Payer: Self-pay | Admitting: Internal Medicine

## 2021-10-18 DIAGNOSIS — Z1231 Encounter for screening mammogram for malignant neoplasm of breast: Secondary | ICD-10-CM

## 2021-11-29 ENCOUNTER — Ambulatory Visit: Payer: Self-pay | Admitting: Dermatology

## 2021-12-11 ENCOUNTER — Other Ambulatory Visit: Payer: Self-pay

## 2021-12-11 ENCOUNTER — Ambulatory Visit
Admission: RE | Admit: 2021-12-11 | Discharge: 2021-12-11 | Disposition: A | Discharge status: Home / Self Care | Payer: Managed Care, Other (non HMO) | Source: Ambulatory Visit | Attending: Internal Medicine | Admitting: Internal Medicine

## 2021-12-11 DIAGNOSIS — Z1231 Encounter for screening mammogram for malignant neoplasm of breast: Secondary | ICD-10-CM | POA: Insufficient documentation

## 2021-12-12 ENCOUNTER — Other Ambulatory Visit: Payer: Self-pay | Admitting: Physician Assistant

## 2021-12-12 DIAGNOSIS — R1011 Right upper quadrant pain: Secondary | ICD-10-CM

## 2021-12-19 ENCOUNTER — Other Ambulatory Visit: Payer: Self-pay

## 2021-12-19 ENCOUNTER — Ambulatory Visit
Admission: RE | Admit: 2021-12-19 | Discharge: 2021-12-19 | Disposition: A | Discharge status: Home / Self Care | Payer: Managed Care, Other (non HMO) | Source: Ambulatory Visit | Attending: Physician Assistant | Admitting: Physician Assistant

## 2021-12-19 DIAGNOSIS — R1011 Right upper quadrant pain: Secondary | ICD-10-CM | POA: Insufficient documentation

## 2022-01-12 ENCOUNTER — Other Ambulatory Visit: Payer: Self-pay | Admitting: Internal Medicine

## 2022-01-12 DIAGNOSIS — R1011 Right upper quadrant pain: Secondary | ICD-10-CM

## 2022-01-17 ENCOUNTER — Other Ambulatory Visit: Payer: Self-pay | Admitting: Internal Medicine

## 2022-01-17 DIAGNOSIS — R1013 Epigastric pain: Secondary | ICD-10-CM

## 2022-01-17 DIAGNOSIS — R1011 Right upper quadrant pain: Secondary | ICD-10-CM

## 2022-01-25 ENCOUNTER — Other Ambulatory Visit: Payer: Managed Care, Other (non HMO)

## 2022-02-02 ENCOUNTER — Ambulatory Visit
Admission: RE | Admit: 2022-02-02 | Discharge: 2022-02-02 | Disposition: A | Discharge status: Home / Self Care | Payer: Managed Care, Other (non HMO) | Source: Ambulatory Visit | Attending: Internal Medicine | Admitting: Internal Medicine

## 2022-02-02 DIAGNOSIS — R1011 Right upper quadrant pain: Secondary | ICD-10-CM

## 2022-02-02 DIAGNOSIS — R1013 Epigastric pain: Secondary | ICD-10-CM

## 2022-02-02 MED ORDER — IOPAMIDOL (ISOVUE-300) INJECTION 61%
100.0000 mL | Freq: Once | INTRAVENOUS | Status: AC | PRN
  Administered 2022-02-02: 100 mL via INTRAVENOUS

## 2022-06-04 ENCOUNTER — Other Ambulatory Visit: Payer: Self-pay | Admitting: Family

## 2022-07-10 ENCOUNTER — Other Ambulatory Visit: Payer: Self-pay | Admitting: Orthopedic Surgery

## 2022-07-10 ENCOUNTER — Other Ambulatory Visit: Payer: Self-pay | Admitting: Internal Medicine

## 2022-07-10 DIAGNOSIS — R1032 Left lower quadrant pain: Secondary | ICD-10-CM

## 2022-07-10 DIAGNOSIS — I10 Essential (primary) hypertension: Secondary | ICD-10-CM

## 2022-07-24 ENCOUNTER — Other Ambulatory Visit: Payer: Managed Care, Other (non HMO)

## 2022-07-26 ENCOUNTER — Ambulatory Visit
Admission: RE | Admit: 2022-07-26 | Discharge: 2022-07-26 | Disposition: A | Discharge status: Home / Self Care | Payer: Managed Care, Other (non HMO) | Source: Ambulatory Visit | Attending: Internal Medicine | Admitting: Internal Medicine

## 2022-07-26 DIAGNOSIS — I10 Essential (primary) hypertension: Secondary | ICD-10-CM

## 2022-07-26 DIAGNOSIS — R1032 Left lower quadrant pain: Secondary | ICD-10-CM

## 2022-07-26 MED ORDER — IOPAMIDOL (ISOVUE-300) INJECTION 61%
100.0000 mL | Freq: Once | INTRAVENOUS | Status: AC | PRN
  Administered 2022-07-26: 100 mL via INTRAVENOUS

## 2022-07-26 MED ORDER — IOPAMIDOL (ISOVUE-300) INJECTION 61%: 100.0000 mL | Freq: Once | INTRAVENOUS | Status: DC | PRN

## 2022-12-11 ENCOUNTER — Ambulatory Visit (INDEPENDENT_AMBULATORY_CARE_PROVIDER_SITE_OTHER): Payer: Self-pay | Admitting: Dermatology

## 2022-12-11 DIAGNOSIS — L689 Hypertrichosis, unspecified: Secondary | ICD-10-CM

## 2022-12-11 NOTE — Patient Instructions (Signed)
Due to recent changes in healthcare laws, you may see results of your pathology and/or laboratory studies on MyChart before the doctors have had a chance to review them. We understand that in some cases there may be results that are confusing or concerning to you. Please understand that not all results are received at the same time and often the doctors may need to interpret multiple results in order to provide you with the best plan of care or course of treatment. Therefore, we ask that you please give us 2 business days to thoroughly review all your results before contacting the office for clarification. Should we see a critical lab result, you will be contacted sooner.   If You Need Anything After Your Visit  If you have any questions or concerns for your doctor, please call our main line at [phone] and press option 4 to reach your doctor's medical assistant. If no one answers, please leave a voicemail as directed and we will return your call as soon as possible. Messages left after 4 pm will be answered the following business day.   You may also send us a message via MyChart. We typically respond to MyChart messages within 1-2 business days.  For prescription refills, please ask your pharmacy to contact our office. Our fax number is [phone].  If you have an urgent issue when the clinic is closed that cannot wait until the next business day, you can page your doctor at the number below.    Please note that while we do our best to be available for urgent issues outside of office hours, we are not available 24/7.   If you have an urgent issue and are unable to reach us, you may choose to seek medical care at your doctor's office, retail clinic, urgent care center, or emergency room.  If you have a medical emergency, please immediately call 911 or go to the emergency department.  Pager Numbers  - Dr. Kowalski: [phone]  - Dr. Moye: [phone]  - Dr. Stewart:  [phone]  In the event of inclement weather, please call our main line at [phone] for an update on the status of any delays or closures.  Dermatology Medication Tips: Please keep the boxes that topical medications come in in order to help keep track of the instructions about where and how to use these. Pharmacies typically print the medication instructions only on the boxes and not directly on the medication tubes.   If your medication is too expensive, please contact our office at [phone] option 4 or send us a message through MyChart.   We are unable to tell what your co-pay for medications will be in advance as this is different depending on your insurance coverage. However, we may be able to find a substitute medication at lower cost or fill out paperwork to get insurance to cover a needed medication.   If a prior authorization is required to get your medication covered by your insurance company, please allow us 1-2 business days to complete this process.  Drug prices often vary depending on where the prescription is filled and some pharmacies may offer cheaper prices.  The website www.goodrx.com contains coupons for medications through different pharmacies. The prices here do not account for what the cost may be with help from insurance (it may be cheaper with your insurance), but the website can give you the price if you did not use any insurance.  - You can print the associated coupon and take it with   your prescription to the pharmacy.  - You may also stop by our office during regular business hours and pick up a GoodRx coupon card.  - If you need your prescription sent electronically to a different pharmacy, notify our office through [facility] MyChart or by phone at [phone] option 4.     Si Usted Necesita Algo Despus de Su Visita  Tambin puede enviarnos un mensaje a travs de MyChart. Por lo general respondemos a los mensajes de MyChart en el transcurso de 1 a 2  das hbiles.  Para renovar recetas, por favor pida a su farmacia que se ponga en contacto con nuestra oficina. Nuestro nmero de fax es el [phone].  Si tiene un asunto urgente cuando la clnica est cerrada y que no puede esperar hasta el siguiente da hbil, puede llamar/localizar a su doctor(a) al nmero que aparece a continuacin.   Por favor, tenga en cuenta que aunque hacemos todo lo posible para estar disponibles para asuntos urgentes fuera del horario de oficina, no estamos disponibles las 24 horas del da, los 7 das de la semana.   Si tiene un problema urgente y no puede comunicarse con nosotros, puede optar por buscar atencin mdica  en el consultorio de su doctor(a), en una clnica privada, en un centro de atencin urgente o en una sala de emergencias.  Si tiene una emergencia mdica, por favor llame inmediatamente al 911 o vaya a la sala de emergencias.  Nmeros de bper  - Dr. Kowalski: [phone]  - Dra. Moye: [phone]  - Dra. Stewart: [phone]  En caso de inclemencias del tiempo, por favor llame a nuestra lnea principal al [phone] para una actualizacin sobre el estado de cualquier retraso o cierre.  Consejos para la medicacin en dermatologa: Por favor, guarde las cajas en las que vienen los medicamentos de uso tpico para ayudarle a seguir las instrucciones sobre dnde y cmo usarlos. Las farmacias generalmente imprimen las instrucciones del medicamento slo en las cajas y no directamente en los tubos del medicamento.   Si su medicamento es muy caro, por favor, pngase en contacto con nuestra oficina llamando al [phone] y presione la opcin 4 o envenos un mensaje a travs de MyChart.   No podemos decirle cul ser su copago por los medicamentos por adelantado ya que esto es diferente dependiendo de la cobertura de su seguro. Sin embargo, es posible que podamos encontrar un medicamento sustituto a menor costo o llenar un formulario para que el  seguro cubra el medicamento que se considera necesario.   Si se requiere una autorizacin previa para que su compaa de seguros cubra su medicamento, por favor permtanos de 1 a 2 das hbiles para completar este proceso.  Los precios de los medicamentos varan con frecuencia dependiendo del lugar de dnde se surte la receta y alguna farmacias pueden ofrecer precios ms baratos.  El sitio web www.goodrx.com tiene cupones para medicamentos de diferentes farmacias. Los precios aqu no tienen en cuenta lo que podra costar con la ayuda del seguro (puede ser ms barato con su seguro), pero el sitio web puede darle el precio si no utiliz ningn seguro.  - Puede imprimir el cupn correspondiente y llevarlo con su receta a la farmacia.  - Tambin puede pasar por nuestra oficina durante el horario de atencin regular y recoger una tarjeta de cupones de GoodRx.  - Si necesita que su receta se enve electrnicamente a una farmacia diferente, informe a nuestra oficina a travs de MyChart de [place]   o por telfono llamando al [phone] y presione la opcin 4.  

## 2022-12-11 NOTE — Progress Notes (Signed)
   Follow-Up Visit   Subjective  Jessica Houston is a 57 y.o. female who presents for the following: Hypertrichosis (Previous laser hair removal about 5 years ago - pt would like LHR of the chin area today ).  The following portions of the chart were reviewed this encounter and updated as appropriate:   Tobacco  Allergies  Meds  Problems  Med Hx  Surg Hx  Fam Hx     Review of Systems:  No other skin or systemic complaints except as noted in HPI or Assessment and Plan.  Objective  Well appearing patient in no apparent distress; mood and affect are within normal limits.  A focused examination was performed including the face. Relevant physical exam findings are noted in the Assessment and Plan.  Face        Assessment & Plan  Hypertrichosis Face  Laser Hair Removal - Face Prior to the procedure, the patient's past medical history, medications, allergies, and the rare but potential risks and complications were reviewed with the patient and a signed consent was obtained.  Pre and post treatment care was discussed and instructions provided.  Body Location: Face Skin Type: I Hair Color: White Hair Type: Facial  Hair Density: Low  Session prep reminder: No more than 0.5 mm hair length (stubble); Teeth protection for treatments around mouth  Treatment #: 1 Wavelength: 17 Area Size cm2: 20 # of Areas: the chin Temp: 15 Pulses: 48  Clinical endpoints: SOS (smell of success)  Notes: Discussed with patient that lighter colored hair may not respond to tx. No charge today for [facility]. Patient will call back and schedule LHR in the future if she finds today's session helpful.   NO CHARGE FOR TODAY'S TREATMENT.   IF PT DECIDES TO DO FURTHER TREATMENTS AFTER TODAY, IT WILL BE $150 PER TREATMENT SESSION  Patient tolerated the procedure well.   Nancy Fetter avoidance was stressed and SPF applied accordingly was discussed.  The patient is to return to clinic 4-6 weeks .  Return if  symptoms worsen or fail to improve.  Luther Redo, CMA, am acting as scribe for Sarina Ser, MD . Documentation: I have reviewed the above documentation for accuracy and completeness, and I agree with the above.  Sarina Ser, MD

## 2022-12-19 ENCOUNTER — Encounter: Payer: Self-pay | Admitting: Dermatology

## 2022-12-25 ENCOUNTER — Other Ambulatory Visit: Payer: Self-pay | Admitting: Internal Medicine

## 2022-12-25 DIAGNOSIS — Z1231 Encounter for screening mammogram for malignant neoplasm of breast: Secondary | ICD-10-CM

## 2023-01-18 ENCOUNTER — Ambulatory Visit
Admission: RE | Admit: 2023-01-18 | Discharge: 2023-01-18 | Disposition: A | Discharge status: Home / Self Care | Payer: Managed Care, Other (non HMO) | Source: Ambulatory Visit | Attending: Internal Medicine | Admitting: Internal Medicine

## 2023-01-18 DIAGNOSIS — Z1231 Encounter for screening mammogram for malignant neoplasm of breast: Secondary | ICD-10-CM | POA: Insufficient documentation

## 2023-01-23 ENCOUNTER — Other Ambulatory Visit: Payer: Self-pay | Admitting: Internal Medicine

## 2023-01-23 DIAGNOSIS — N6489 Other specified disorders of breast: Secondary | ICD-10-CM

## 2023-01-23 DIAGNOSIS — R928 Other abnormal and inconclusive findings on diagnostic imaging of breast: Secondary | ICD-10-CM

## 2023-01-29 ENCOUNTER — Ambulatory Visit
Admission: RE | Admit: 2023-01-29 | Discharge: 2023-01-29 | Disposition: A | Discharge status: Home / Self Care | Payer: Managed Care, Other (non HMO) | Source: Ambulatory Visit | Attending: Internal Medicine | Admitting: Internal Medicine

## 2023-01-29 DIAGNOSIS — R928 Other abnormal and inconclusive findings on diagnostic imaging of breast: Secondary | ICD-10-CM

## 2023-01-29 DIAGNOSIS — N6489 Other specified disorders of breast: Secondary | ICD-10-CM

## 2023-02-13 ENCOUNTER — Telehealth: Payer: Self-pay

## 2023-02-13 NOTE — Telephone Encounter (Signed)
Patient is scheduled tomorrow for [facility] procedure at 1:30. Dr. Laurence Ferrari would like to change that appt to a Tuesday afternoon for 45 minutes in 1-2 weeks. She would also like to know which area patient wants to have treated. Tried to call patient but had to leave her a message asking her to return my call. Lurlean Horns., RMA

## 2023-02-14 ENCOUNTER — Ambulatory Visit: Payer: Managed Care, Other (non HMO) | Admitting: Dermatology

## 2023-02-19 ENCOUNTER — Encounter: Payer: Self-pay | Admitting: Dermatology

## 2023-02-19 ENCOUNTER — Ambulatory Visit (INDEPENDENT_AMBULATORY_CARE_PROVIDER_SITE_OTHER): Payer: Self-pay | Admitting: Dermatology

## 2023-02-19 DIAGNOSIS — L988 Other specified disorders of the skin and subcutaneous tissue: Secondary | ICD-10-CM

## 2023-02-19 NOTE — Progress Notes (Addendum)
   Follow-Up Visit   Subjective  Jessica Houston is a 57 y.o. female who presents for the following: Procedure (Patient here today for Skin Tyte laser).  The following portions of the chart were reviewed this encounter and updated as appropriate:   Tobacco  Allergies  Meds  Problems  Med Hx  Surg Hx  Fam Hx      Review of Systems:  No other skin or systemic complaints except as noted in HPI or Assessment and Plan.  Objective  Well appearing patient in no apparent distress; mood and affect are within normal limits.  A focused examination was performed including neck. Relevant physical exam findings are noted in the Assessment and Plan.  Neck Rhytides and volume loss.                      Assessment & Plan  Elastosis of skin Neck  Patient advised she likely has too much laxity to have a full lift and that surgery would give her more satisfactory results. Discussed she should see improvement in skin texture and crepiness. Recommend at least 3 treatments to see benefit. Discussed maintenance treatment every 3-6 months after that.  Photorejuvenation - Neck  Skintyte Motion - 02/19/23 1400      Treatment Details   Date: 02/19/23    Photos (Yes/No): Yes    ST Filter: 590ST    Body Location: neck    Full Crystal or Square Adapter: full    Intensity W/cm2: 13    Time in Seconds: 12 seconds    Time on Tissue at Desired Temp: 5 minutes each side          Return for 3 weeks and 6 weeks for Skin Tyte.  Anise Salvo, RMA, am acting as scribe for Darden Dates, MD .  Documentation: I have reviewed the above documentation for accuracy and completeness, and I agree with the above.  Darden Dates, MD

## 2023-02-25 ENCOUNTER — Encounter: Payer: Self-pay | Admitting: Dermatology

## 2023-03-12 ENCOUNTER — Ambulatory Visit: Payer: Managed Care, Other (non HMO) | Admitting: Dermatology

## 2023-04-11 ENCOUNTER — Encounter: Payer: Self-pay | Admitting: Dermatology

## 2023-04-11 ENCOUNTER — Ambulatory Visit (INDEPENDENT_AMBULATORY_CARE_PROVIDER_SITE_OTHER): Payer: Self-pay | Admitting: Dermatology

## 2023-04-11 VITALS — BP 136/77 | HR 83

## 2023-04-11 DIAGNOSIS — L988 Other specified disorders of the skin and subcutaneous tissue: Secondary | ICD-10-CM

## 2023-04-11 NOTE — Patient Instructions (Signed)
Recommend daily broad spectrum sunscreen SPF 30+ to sun-exposed areas, reapply every 2 hours as needed. Call for new or changing lesions.  Staying in the shade or wearing long sleeves, sun glasses (UVA+UVB protection) and wide brim hats (4-inch brim around the entire circumference of the hat) are also recommended for sun protection.    Due to recent changes in healthcare laws, you may see results of your pathology and/or laboratory studies on MyChart before the doctors have had a chance to review them. We understand that in some cases there may be results that are confusing or concerning to you. Please understand that not all results are received at the same time and often the doctors may need to interpret multiple results in order to provide you with the best plan of care or course of treatment. Therefore, we ask that you please give us 2 business days to thoroughly review all your results before contacting the office for clarification. Should we see a critical lab result, you will be contacted sooner.   If You Need Anything After Your Visit  If you have any questions or concerns for your doctor, please call our main line at [phone] and press option 4 to reach your doctor's medical assistant. If no one answers, please leave a voicemail as directed and we will return your call as soon as possible. Messages left after 4 pm will be answered the following business day.   You may also send us a message via MyChart. We typically respond to MyChart messages within 1-2 business days.  For prescription refills, please ask your pharmacy to contact our office. Our fax number is [phone].  If you have an urgent issue when the clinic is closed that cannot wait until the next business day, you can page your doctor at the number below.    Please note that while we do our best to be available for urgent issues outside of office hours, we are not available 24/7.   If you have an urgent issue and are  unable to reach us, you may choose to seek medical care at your doctor's office, retail clinic, urgent care center, or emergency room.  If you have a medical emergency, please immediately call 911 or go to the emergency department.  Pager Numbers  - Dr. Kowalski: [phone]  - Dr. Moye: [phone]  - Dr. Stewart: [phone]  In the event of inclement weather, please call our main line at [phone] for an update on the status of any delays or closures.  Dermatology Medication Tips: Please keep the boxes that topical medications come in in order to help keep track of the instructions about where and how to use these. Pharmacies typically print the medication instructions only on the boxes and not directly on the medication tubes.   If your medication is too expensive, please contact our office at [phone] option 4 or send us a message through MyChart.   We are unable to tell what your co-pay for medications will be in advance as this is different depending on your insurance coverage. However, we may be able to find a substitute medication at lower cost or fill out paperwork to get insurance to cover a needed medication.   If a prior authorization is required to get your medication covered by your insurance company, please allow us 1-2 business days to complete this process.  Drug prices often vary depending on where the prescription is filled and some pharmacies may offer cheaper prices.  The website www.goodrx.com   contains coupons for medications through different pharmacies. The prices here do not account for what the cost may be with help from insurance (it may be cheaper with your insurance), but the website can give you the price if you did not use any insurance.  - You can print the associated coupon and take it with your prescription to the pharmacy.  - You may also stop by our office during regular business hours and pick up a GoodRx coupon card.  - If you need your  prescription sent electronically to a different pharmacy, notify our office through [facility] MyChart or by phone at [phone] option 4.     Si Usted Necesita Algo Despus de Su Visita  Tambin puede enviarnos un mensaje a travs de MyChart. Por lo general respondemos a los mensajes de MyChart en el transcurso de 1 a 2 das hbiles.  Para renovar recetas, por favor pida a su farmacia que se ponga en contacto con nuestra oficina. Nuestro nmero de fax es el [phone].  Si tiene un asunto urgente cuando la clnica est cerrada y que no puede esperar hasta el siguiente da hbil, puede llamar/localizar a su doctor(a) al nmero que aparece a continuacin.   Por favor, tenga en cuenta que aunque hacemos todo lo posible para estar disponibles para asuntos urgentes fuera del horario de oficina, no estamos disponibles las 24 horas del da, los 7 das de la semana.   Si tiene un problema urgente y no puede comunicarse con nosotros, puede optar por buscar atencin mdica  en el consultorio de su doctor(a), en una clnica privada, en un centro de atencin urgente o en una sala de emergencias.  Si tiene una emergencia mdica, por favor llame inmediatamente al 911 o vaya a la sala de emergencias.  Nmeros de bper  - Dr. Kowalski: [phone]  - Dra. Moye: [phone]  - Dra. Stewart: [phone]  En caso de inclemencias del tiempo, por favor llame a nuestra lnea principal al [phone] para una actualizacin sobre el estado de cualquier retraso o cierre.  Consejos para la medicacin en dermatologa: Por favor, guarde las cajas en las que vienen los medicamentos de uso tpico para ayudarle a seguir las instrucciones sobre dnde y cmo usarlos. Las farmacias generalmente imprimen las instrucciones del medicamento slo en las cajas y no directamente en los tubos del medicamento.   Si su medicamento es muy caro, por favor, pngase en contacto con nuestra oficina llamando al [phone]  y presione la opcin 4 o envenos un mensaje a travs de MyChart.   No podemos decirle cul ser su copago por los medicamentos por adelantado ya que esto es diferente dependiendo de la cobertura de su seguro. Sin embargo, es posible que podamos encontrar un medicamento sustituto a menor costo o llenar un formulario para que el seguro cubra el medicamento que se considera necesario.   Si se requiere una autorizacin previa para que su compaa de seguros cubra su medicamento, por favor permtanos de 1 a 2 das hbiles para completar este proceso.  Los precios de los medicamentos varan con frecuencia dependiendo del lugar de dnde se surte la receta y alguna farmacias pueden ofrecer precios ms baratos.  El sitio web www.goodrx.com tiene cupones para medicamentos de diferentes farmacias. Los precios aqu no tienen en cuenta lo que podra costar con la ayuda del seguro (puede ser ms barato con su seguro), pero el sitio web puede darle el precio si no utiliz ningn seguro.  - Puede imprimir el   cupn correspondiente y llevarlo con su receta a la farmacia.  - Tambin puede pasar por nuestra oficina durante el horario de atencin regular y recoger una tarjeta de cupones de GoodRx.  - Si necesita que su receta se enve electrnicamente a una farmacia diferente, informe a nuestra oficina a travs de MyChart de [facility] o por telfono llamando al [phone] y presione la opcin 4.  

## 2023-04-11 NOTE — Progress Notes (Signed)
   Follow-Up Visit   Subjective  Jessica Houston is a 57 y.o. female who presents for the following: Skintyte. 2nd treatment.   The following portions of the chart were reviewed this encounter and updated as appropriate: medications, allergies, medical history  Review of Systems:  No other skin or systemic complaints except as noted in HPI or Assessment and Plan.  Objective  Well appearing patient in no apparent distress; mood and affect are within normal limits.   A focused examination was performed of the following areas: face, neck   Relevant exam findings are noted in the Assessment and Plan.         Left Neck     Right Neck  Assessment & Plan ELASTOSIS OF SKIN  Skintyte Photorejuvenation Prior to the procedure, the patient's past medical history, medications, allergies, and the rare but potential risks and complications were reviewed with the patient and a signed consent was obtained.  Pre and post treatment care was discussed and instructions provided. Eye shields were in place for the duration of treatment.  Skintyte Motion  Treatment Details  Photos (Yes/No): Yes ST Filter: 590   Body Location: Neck Full Crystal or Square Adapter: See photos above Intensity W/cm2: see photos above   Time in Seconds: 12 seconds   Time on Tissue at Desired Temp: Per protocol (4 minutes for each area of neck or cheek, 2 minutes for lower eyelids) External Temp: Target temperature 40-42 degrees reached  Patient tolerated the procedure well.   Wynelle Link avoidance was stressed. The patient will call with any problems, questions or concerns prior to their next appointment.   Documentation: I have reviewed the above documentation for accuracy and completeness, and I agree with the above.  I, Lawson Radar, CMA, am acting as scribe for Darden Dates, MD.   Darden Dates, MD

## 2023-05-07 ENCOUNTER — Ambulatory Visit (INDEPENDENT_AMBULATORY_CARE_PROVIDER_SITE_OTHER): Payer: Managed Care, Other (non HMO) | Admitting: Dermatology

## 2023-05-07 VITALS — BP 129/75

## 2023-05-07 DIAGNOSIS — W908XXA Exposure to other nonionizing radiation, initial encounter: Secondary | ICD-10-CM

## 2023-05-07 DIAGNOSIS — Z79899 Other long term (current) drug therapy: Secondary | ICD-10-CM

## 2023-05-07 DIAGNOSIS — L578 Other skin changes due to chronic exposure to nonionizing radiation: Secondary | ICD-10-CM | POA: Diagnosis not present

## 2023-05-07 DIAGNOSIS — L570 Actinic keratosis: Secondary | ICD-10-CM

## 2023-05-07 DIAGNOSIS — I781 Nevus, non-neoplastic: Secondary | ICD-10-CM

## 2023-05-07 DIAGNOSIS — Z7189 Other specified counseling: Secondary | ICD-10-CM

## 2023-05-07 DIAGNOSIS — Z5111 Encounter for antineoplastic chemotherapy: Secondary | ICD-10-CM

## 2023-05-07 DIAGNOSIS — L988 Other specified disorders of the skin and subcutaneous tissue: Secondary | ICD-10-CM | POA: Diagnosis not present

## 2023-05-07 MED ORDER — FLUOROURACIL 5 % EX CREA: TOPICAL_CREAM | Freq: Two times a day (BID) | CUTANEOUS | 0 refills | Status: AC

## 2023-05-07 NOTE — Patient Instructions (Addendum)
In 1 month, 06/06/23,Start 5-fluorouracil/calcipotriene cream twice a day for 7 days to affected areas including left forehead. Prescription sent to [facility]. Patient advised they will receive an email to purchase the medication online and have it sent to their home. Patient provided with handout reviewing treatment course and side effects and advised to call or message [place] on MyChart with any concerns.  Instructions for [facility] Medications  One or more of your medications was sent to the [facility] order [facility]. You will receive an email from them and can purchase the medicine through that link. It will then be mailed to your home at the address you confirmed. If for any reason you do not receive an email from them, please check your spam folder. If you still do not find the email, please let us know. [facility] phone number is [phone].   5-Fluorouracil/Calcipotriene Patient Education   Actinic keratoses are the dry, red scaly spots on the skin caused by sun damage. A portion of these spots can turn into skin cancer with time, and treating them can help prevent development of skin cancer.   Treatment of these spots requires removal of the defective skin cells. There are various ways to remove actinic keratoses, including freezing with liquid nitrogen, treatment with creams, or treatment with a blue light procedure in the office.   5-fluorouracil cream is a topical cream used to treat actinic keratoses. It works by interfering with the growth of abnormal fast-growing skin cells, such as actinic keratoses. These cells peel off and are replaced by healthy ones.   5-fluorouracil/calcipotriene is a combination of the 5-fluorouracil cream with a vitamin D analog cream called calcipotriene. The calcipotriene alone does not treat actinic keratoses. However, when it is combined with 5-fluorouracil, it helps the 5-fluorouracil treat the  actinic keratoses much faster so that the same results can be achieved with a much shorter treatment time.  INSTRUCTIONS FOR 5-FLUOROURACIL/CALCIPOTRIENE CREAM:   5-fluorouracil/calcipotriene cream typically only needs to be used for 4-7 days. A thin layer should be applied twice a day to the treatment areas recommended by your physician.   If your physician prescribed you separate tubes of 5-fluourouracil and calcipotriene, apply a thin layer of 5-fluorouracil followed by a thin layer of calcipotriene.   Avoid contact with your eyes, nostrils, and mouth. Do not use 5-fluorouracil/calcipotriene cream on infected or open wounds.   You will develop redness, irritation and some crusting at areas where you have pre-cancer damage/actinic keratoses. IF YOU DEVELOP PAIN, BLEEDING, OR SIGNIFICANT CRUSTING, STOP THE TREATMENT EARLY - you have already gotten a good response and the actinic keratoses should clear up well.  Wash your hands after applying 5-fluorouracil 5% cream on your skin.   A moisturizer or sunscreen with a minimum SPF 30 should be applied each morning.   Once you have finished the treatment, you can apply a thin layer of Vaseline twice a day to irritated areas to soothe and calm the areas more quickly. If you experience significant discomfort, contact your physician.  For some patients it is necessary to repeat the treatment for best results.  SIDE EFFECTS: When using 5-fluorouracil/calcipotriene cream, you may have mild irritation, such as redness, dryness, swelling, or a mild burning sensation. This usually resolves within 2 weeks. The more actinic keratoses you have, the more redness and inflammation you can expect during treatment. Eye irritation has been reported rarely. If this occurs, please let us know.  If you have  any trouble using this cream, please call the office. If you have any other questions about this information, please do not hesitate to ask me before you leave the  office.  Cryotherapy Aftercare  Wash gently with soap and water everyday.   Apply Vaseline and Band-Aid daily until healed.       Due to recent changes in healthcare laws, you may see results of your pathology and/or laboratory studies on MyChart before the doctors have had a chance to review them. We understand that in some cases there may be results that are confusing or concerning to you. Please understand that not all results are received at the same time and often the doctors may need to interpret multiple results in order to provide you with the best plan of care or course of treatment. Therefore, we ask that you please give [place] 2 business days to thoroughly review all your results before contacting the office for clarification. Should we see a critical lab result, you will be contacted sooner.   If You Need Anything After Your Visit  If you have any questions or concerns for your doctor, please call our main line at [phone] and press option 4 to reach your doctor's medical assistant. If no one answers, please leave a voicemail as directed and we will return your call as soon as possible. Messages left after 4 pm will be answered the following business day.   You may also send [place] a message via MyChart. We typically respond to MyChart messages within 1-2 business days.  For prescription refills, please ask your pharmacy to contact our office. Our fax number is [phone].  If you have an urgent issue when the clinic is closed that cannot wait until the next business day, you can page your doctor at the number below.    Please note that while we do our best to be available for urgent issues outside of office hours, we are not available 24/7.   If you have an urgent issue and are unable to reach [place], you may choose to seek medical care at your doctor's office, retail clinic, urgent care center, or emergency room.  If you have a medical emergency, please immediately call 911 or go  to the emergency department.  Pager Numbers  - Dr. Gwen Pounds: [phone]  - Dr. Neale Burly: [phone]  - Dr. Roseanne Reno: [phone]  In the event of inclement weather, please call our main line at [phone] for an update on the status of any delays or closures.  Dermatology Medication Tips: Please keep the boxes that topical medications come in in order to help keep track of the instructions about where and how to use these. Pharmacies typically print the medication instructions only on the boxes and not directly on the medication tubes.   If your medication is too expensive, please contact our office at [phone] option 4 or send [place] a message through MyChart.   We are unable to tell what your co-pay for medications will be in advance as this is different depending on your insurance coverage. However, we may be able to find a substitute medication at lower cost or fill out paperwork to get insurance to cover a needed medication.   If a prior authorization is required to get your medication covered by your insurance company, please allow [place] 1-2 business days to complete this process.  Drug prices often vary depending on where the prescription is filled and some pharmacies may offer cheaper prices.  The website www.goodrx.com  contains coupons for medications through different pharmacies. The prices here do not account for what the cost may be with help from insurance (it may be cheaper with your insurance), but the website can give you the price if you did not use any insurance.  - You can print the associated coupon and take it with your prescription to the pharmacy.  - You may also stop by our office during regular business hours and pick up a GoodRx coupon card.  - If you need your prescription sent electronically to a different pharmacy, notify our office through [facility] or by phone at [phone] option 4.     Si Usted Necesita Algo Despus de Su Visita  Tambin  puede enviarnos un mensaje a travs de Clinical cytogeneticist. Por lo general respondemos a los mensajes de MyChart en el transcurso de 1 a 2 das hbiles.  Para renovar recetas, por favor pida a su farmacia que se ponga en contacto con nuestra oficina. Annie Sable de fax es [place] [phone].  Si tiene un asunto urgente cuando la clnica est cerrada y que no puede esperar hasta el siguiente da hbil, puede llamar/localizar a su doctor(a) al nmero que aparece a continuacin.   Por favor, tenga en cuenta que aunque hacemos todo lo posible para estar disponibles para asuntos urgentes fuera del horario de [place], no estamos disponibles las 24 horas del da, los 7 [address]  [address] de la [place].   Si tiene un problema urgente y no puede comunicarse con nosotros, puede optar por buscar atencin mdica  en el consultorio de su doctor(a), en una clnica privada, en un centro de atencin urgente o en una sala de emergencias.  Si tiene Engineer, drilling, por favor llame inmediatamente al 911 o vaya a la sala de emergencias.  Nmeros de bper  - Dr. Gwen Pounds: [phone]  - Dra. Moye: [phone]  - Dra. Roseanne Reno: [phone]  En caso de inclemencias del [place], por favor llame a Lacy Duverney principal al [phone] para una actualizacin sobre el [place] de cualquier retraso o cierre.  Consejos para la medicacin en dermatologa: Por favor, guarde las cajas en las que vienen los medicamentos de uso tpico para ayudarle a seguir las instrucciones sobre dnde y cmo usarlos. Las farmacias generalmente imprimen las instrucciones del medicamento slo en las cajas y no directamente en los tubos del [place].   Si su medicamento es muy caro, por favor, pngase en contacto con Rolm Gala llamando al [phone] y presione la opcin 4 o envenos un mensaje a travs de Clinical cytogeneticist.   No podemos decirle cul ser su copago por los medicamentos por adelantado ya que esto es diferente dependiendo de la cobertura de su  seguro. Sin embargo, es posible que podamos encontrar un medicamento sustituto a Audiological scientist un formulario para que el seguro cubra el medicamento que se considera necesario.   Si se requiere una autorizacin previa para que su compaa de seguros [place] su medicamento, por favor permtanos de 1 a 2 das hbiles para completar [address]  Los precios de los medicamentos varan con frecuencia dependiendo del Environmental consultant de dnde se surte la receta y alguna farmacias pueden ofrecer precios ms baratos.  El sitio web www.goodrx.com tiene cupones para medicamentos de Health and safety inspector. Los precios aqu no tienen en cuenta lo que podra costar con la ayuda del seguro (puede ser ms barato con su seguro), pero el sitio web puede darle el precio si no utiliz Tourist information centre manager.  - Puede imprimir el  cupn correspondiente y llevarlo con su receta a la farmacia.  - Tambin puede pasar por nuestra oficina durante el horario de atencin regular y recoger una tarjeta de cupones de GoodRx.  - Si necesita que su receta se enve electrnicamente a una farmacia diferente, informe a nuestra oficina a travs de MyChart de [facility] o por telfono llamando al [phone] y presione la opcin 4.  

## 2023-05-07 NOTE — Progress Notes (Unsigned)
Follow-Up Visit   Subjective  Jessica Houston is a 57 y.o. female who presents for the following: SkinTyte to neck, perioral, check spots forehead, chest  The following portions of the chart were reviewed this encounter and updated as appropriate: medications, allergies, medical history  Review of Systems:  No other skin or systemic complaints except as noted in HPI or Assessment and Plan.  Objective  Well appearing patient in no apparent distress; mood and affect are within normal limits. A focused examination was performed of the following areas: Face, neck Relevant exam findings are noted in the Assessment and Plan.  L forehead x 2 (2) Pink scaly macules        Left neck   Right neck   Left perioral   Right perioral   Assessment & Plan   ELASTOSIS OF SKIN Exam: Rhytides and volume loss.  Treatment Plan: Skintyte Photorejuvenation Prior to the procedure, the patient's past medical history, medications, allergies, and the rare but potential risks and complications were reviewed with the patient and a signed consent was obtained.  Pre and post treatment care was discussed and instructions provided. Eye shields were in place for the duration of treatment.   Skintyte Motion - 05/07/23 1600      Treatment Details   Date: 05/07/23    Photos (Yes/No): Yes    ST Filter: 590ST    Body Location: neck, face    Full Crystal or Square Adapter: full    Intensity W/cm2: 14    Time in Seconds: 12 seconds    Cooling: 30    Total Number of Pulses: 29    Total Fluence: --   L neck 14,123, R neck 14,626, L perioral 6,902, R perioral 7,285 Time on Tissue at Desired Temp: Per protocol (4 minutes for each area of neck or cheek, 2 minutes for lower eyelids) External Temp: Target temperature 40-42 degrees reached   Patient tolerated the procedure well.    Wynelle Link avoidance was stressed. The patient will call with any problems, questions or concerns prior to their next  appointment.   Wynelle Link avoidance was stressed. The patient will call with any problems, questions or concerns prior to their next appointment.       AK (actinic keratosis) (2) L forehead x 2  Destruction of lesion - L forehead x 2 Complexity: simple   Destruction method: cryotherapy   Informed consent: discussed and consent obtained   Timeout:  patient name, date of birth, surgical site, and procedure verified Lesion destroyed using liquid nitrogen: Yes   Region frozen until ice ball extended beyond lesion: Yes   Outcome: patient tolerated procedure well with no complications   Post-procedure details: wound care instructions given    ACTINIC DAMAGE WITH PRECANCEROUS ACTINIC KERATOSES Counseling for Topical Chemotherapy Management: Patient exhibits: - Severe, confluent actinic changes with pre-cancerous actinic keratoses that is secondary to cumulative UV radiation exposure over time - Condition that is severe; chronic, not at goal. - diffuse scaly erythematous macules and papules with underlying dyspigmentation - Discussed Prescription "Field Treatment" topical Chemotherapy for Severe, Chronic Confluent Actinic Changes with Pre-Cancerous Actinic Keratoses Field treatment involves treatment of an entire area of skin that has confluent Actinic Changes (Sun/ Ultraviolet light damage) and PreCancerous Actinic Keratoses by method of PhotoDynamic Therapy (PDT) and/or prescription Topical Chemotherapy agents such as 5-fluorouracil, 5-fluorouracil/calcipotriene, and/or imiquimod.  The purpose is to decrease the number of clinically evident and subclinical PreCancerous lesions to prevent progression to development of skin cancer  by chemically destroying early precancer changes that may or may not be visible.  It has been shown to reduce the risk of developing skin cancer in the treated area. As a result of treatment, redness, scaling, crusting, and open sores may occur during treatment course. One or  more than one of these methods may be used and may have to be used several times to control, suppress and eliminate the PreCancerous changes. Discussed treatment course, expected reaction, and possible side effects. - Recommend daily broad spectrum sunscreen SPF 30+ to sun-exposed areas, reapply every 2 hours as needed.  - Staying in the shade or wearing long sleeves, sun glasses (UVA+UVB protection) and wide brim hats (4-inch brim around the entire circumference of the hat) are also recommended. - Call for new or changing lesions.  -- In 1 month, 06/06/23,Start 5-fluorouracil/calcipotriene cream twice a day for 7 days to affected areas including left forehead. Prescription sent to [facility]. Patient advised they will receive an email to purchase the medication online and have it sent to their home. Patient provided with handout reviewing treatment course and side effects and advised to call or message [place] on MyChart with any concerns.  Reviewed course of treatment and expected reaction.  Patient advised to expect inflammation and crusting and advised that erosions are possible.  Patient advised to be diligent with sun protection during and after treatment. Counseled to keep medication out of reach of children and pets.   TELANGIECTASIA Exam: dilated blood vessel L chest - blanches to diascopy Treatment Plan: Benign appearing on exam Call for changes   Return in about 1 month (around 06/06/2023) for Skintyte perioral.  I, [name], RMA, am acting as scribe for Armida Sans, MD .  Documentation: I have reviewed the above documentation for accuracy and completeness, and I agree with the above.  Armida Sans, MD

## 2023-05-08 ENCOUNTER — Encounter: Payer: Self-pay | Admitting: Dermatology

## 2023-07-02 ENCOUNTER — Ambulatory Visit: Payer: Managed Care, Other (non HMO) | Admitting: Dermatology

## 2023-11-04 ENCOUNTER — Other Ambulatory Visit: Payer: Self-pay | Admitting: Internal Medicine

## 2023-11-04 DIAGNOSIS — I10 Essential (primary) hypertension: Secondary | ICD-10-CM

## 2023-11-04 DIAGNOSIS — E782 Mixed hyperlipidemia: Secondary | ICD-10-CM

## 2023-11-04 DIAGNOSIS — Z789 Other specified health status: Secondary | ICD-10-CM

## 2023-11-18 ENCOUNTER — Ambulatory Visit
Admission: RE | Admit: 2023-11-18 | Discharge: 2023-11-18 | Disposition: A | Discharge status: Home / Self Care | Payer: Self-pay | Source: Ambulatory Visit | Attending: Internal Medicine | Admitting: Internal Medicine

## 2023-11-18 DIAGNOSIS — Z789 Other specified health status: Secondary | ICD-10-CM | POA: Insufficient documentation

## 2023-11-18 DIAGNOSIS — E782 Mixed hyperlipidemia: Secondary | ICD-10-CM | POA: Insufficient documentation

## 2023-11-18 DIAGNOSIS — I10 Essential (primary) hypertension: Secondary | ICD-10-CM | POA: Insufficient documentation

## 2024-02-11 ENCOUNTER — Other Ambulatory Visit: Payer: Self-pay | Admitting: Internal Medicine

## 2024-02-11 DIAGNOSIS — N644 Mastodynia: Secondary | ICD-10-CM

## 2024-02-19 ENCOUNTER — Ambulatory Visit
Admission: RE | Admit: 2024-02-19 | Discharge: 2024-02-19 | Disposition: A | Discharge status: Home / Self Care | Source: Ambulatory Visit | Attending: Internal Medicine | Admitting: Internal Medicine

## 2024-02-19 DIAGNOSIS — N644 Mastodynia: Secondary | ICD-10-CM

## 2024-04-07 ENCOUNTER — Other Ambulatory Visit: Payer: Self-pay | Admitting: Orthopedic Surgery

## 2024-04-07 DIAGNOSIS — M25432 Effusion, left wrist: Secondary | ICD-10-CM

## 2024-04-07 DIAGNOSIS — G5602 Carpal tunnel syndrome, left upper limb: Secondary | ICD-10-CM

## 2024-04-07 DIAGNOSIS — M25532 Pain in left wrist: Secondary | ICD-10-CM

## 2024-04-07 DIAGNOSIS — R2232 Localized swelling, mass and lump, left upper limb: Secondary | ICD-10-CM

## 2024-04-09 ENCOUNTER — Other Ambulatory Visit: Payer: Self-pay | Admitting: Orthopedic Surgery

## 2024-04-09 DIAGNOSIS — M25432 Effusion, left wrist: Secondary | ICD-10-CM

## 2024-04-09 DIAGNOSIS — R2232 Localized swelling, mass and lump, left upper limb: Secondary | ICD-10-CM

## 2024-04-09 DIAGNOSIS — G5602 Carpal tunnel syndrome, left upper limb: Secondary | ICD-10-CM

## 2024-04-09 DIAGNOSIS — M25532 Pain in left wrist: Secondary | ICD-10-CM

## 2024-04-17 ENCOUNTER — Ambulatory Visit
Admission: RE | Admit: 2024-04-17 | Discharge: 2024-04-17 | Disposition: A | Discharge status: Home / Self Care | Source: Ambulatory Visit | Attending: Orthopedic Surgery | Admitting: Orthopedic Surgery

## 2024-04-17 DIAGNOSIS — G5602 Carpal tunnel syndrome, left upper limb: Secondary | ICD-10-CM | POA: Diagnosis present

## 2024-04-17 DIAGNOSIS — M25532 Pain in left wrist: Secondary | ICD-10-CM | POA: Diagnosis present

## 2024-04-17 DIAGNOSIS — M25432 Effusion, left wrist: Secondary | ICD-10-CM | POA: Insufficient documentation

## 2024-04-17 DIAGNOSIS — R2232 Localized swelling, mass and lump, left upper limb: Secondary | ICD-10-CM | POA: Diagnosis present

## 2024-04-17 MED ORDER — GADOBUTROL 1 MMOL/ML IV SOLN
7.5000 mL | Freq: Once | INTRAVENOUS | Status: AC | PRN
  Administered 2024-04-17: 7.5 mL via INTRAVENOUS

## 2024-07-21 ENCOUNTER — Other Ambulatory Visit: Payer: Self-pay | Admitting: Internal Medicine

## 2024-07-21 DIAGNOSIS — R42 Dizziness and giddiness: Secondary | ICD-10-CM

## 2024-07-21 DIAGNOSIS — I10 Essential (primary) hypertension: Secondary | ICD-10-CM

## 2024-07-23 ENCOUNTER — Ambulatory Visit
Admission: RE | Admit: 2024-07-23 | Discharge: 2024-07-23 | Disposition: A | Discharge status: Home / Self Care | Source: Ambulatory Visit | Attending: Internal Medicine | Admitting: Internal Medicine

## 2024-07-23 DIAGNOSIS — R42 Dizziness and giddiness: Secondary | ICD-10-CM | POA: Insufficient documentation

## 2024-07-23 DIAGNOSIS — I10 Essential (primary) hypertension: Secondary | ICD-10-CM | POA: Insufficient documentation

## 2024-08-11 ENCOUNTER — Other Ambulatory Visit: Payer: Self-pay | Admitting: Surgery

## 2024-08-11 DIAGNOSIS — M7641 Tibial collateral bursitis [Pellegrini-Stieda], right leg: Secondary | ICD-10-CM

## 2024-08-11 DIAGNOSIS — M25561 Pain in right knee: Secondary | ICD-10-CM

## 2024-08-17 NOTE — Progress Notes (Unsigned)
 Cardiology Office Note  Date:  08/18/2024   ID:  Jessica Houston 11/10/1966, MRN [id number]  PCP:  Jessica Reyes BIRCH, MD   Chief Complaint  Patient presents with   New Patient (Initial Visit)    Patient referred for left arm pain. Patient states that she has had the pain for a month. Patient reports that she had dizziness about 6 weeks ago. Patient was given medication and symptoms improved. Denies any dizziness since then.    HPI:  Jessica Sammons Billingsis a 58 y.o. femalewith past medical history of: Diabetes HBA1C 6.6 obesity GERD anxiety HTN Asthma History of paroxysmal tachycardia OSA, not on CPAP, felt worse, more tired CT coronary calcium  score of 21 May 2018 Updated CT calcium  score December 2024, score of 28 Who presents by referral from Dr. Auston for left arm pain  Last seen in clinic by myself January 2020   Reports recent episode of dizziness, Seen by primary care treated with meclizine, symptoms resolved Described a spinning, had to lay down very still for symptoms to be tolerable No recurrence today  Additional workup for dizziness as below Carotid ultrasound July 28, 2024 Right: No evidence of hemodynamically significant stenosis  Left: No evidence of hemodynamically significant stenosis   Echo October 2024 Normal LV function EF 55%  Weight coming down on Mounjaro  Reports left arm discomfort, may have been lifting heavy jugs of water, hurt her shoulder, pain goes all the way down Followed by orthopedics  Lab work reviewed A1C 5.6 LDL 120  Latest calcium  score images pulled up and reviewed, calcium  score 28 up from less than 10 5 years ago  Tachycardia well-controlled on beta-blocker  Prior history left shoulder, left posterior numbness tingling down the arm 2019 into early 2020 Resolved without intervention  Reports prior history of statin intolerance  EKG personally reviewed by myself on todays visit EKG  Interpretation Date/Time:  Tuesday August 18 2024 09:56:19 EDT Ventricular Rate:  74 PR Interval:  154 QRS Duration:  74 QT Interval:  368 QTC Calculation: 408 R Axis:   65  Text Interpretation: Normal sinus rhythm Normal ECG When compared with ECG of 31-Mar-2020 11:35, No significant change was found Confirmed by Perla Lye [phone]) on 08/18/2024 10:35:31 AM    PMH:   has a past medical history of Anxiety, Asthma, Bronchitis, Cancer (HCC), Diabetes mellitus, Esophageal reflux, Family history of adverse reaction to anesthesia, Hyperlipidemia, Hypertension, PONV (postoperative nausea and vomiting), S/P endoscopy, Sleep apnea, and Vitamin D  deficiency.  PSH:    Past Surgical History:  Procedure Laterality Date   CESAREAN SECTION N/A    HEMORRHOID SURGERY N/A 06/21/2015   Procedure: Internal and external hemorrhoidectomy;  Surgeon: Larinda Unknown Sharps, MD;  Location: [facility] OR[facility];  Service: General;  Laterality: N/A;   LAPAROSCOPIC BILATERAL SALPINGECTOMY  04/08/2020   Procedure: LAPAROSCOPIC BILATERAL SALPINGECTOMY;  Surgeon: Verdon Keen, MD;  Location: [facility];  Service: Gynecology;;   LAPAROSCOPIC HYSTERECTOMY N/A 04/08/2020   Procedure: HYSTERECTOMY TOTAL LAPAROSCOPIC;  Surgeon: Verdon Keen, MD;  Location: [facility] OR[facility];  Service: Gynecology;  Laterality: N/A;    Current Outpatient Medications  Medication Sig Dispense Refill   amLODipine-olmesartan (AZOR) 5-20 MG tablet Take 1 tablet by mouth daily.     calcium  carbonate (TUMS) 500 MG chewable tablet Chew 500 mg by mouth daily as needed (intergestion).      cetirizine (ZYRTEC) 10 MG tablet Take 5 mg by mouth at bedtime.      clotrimazole -betamethasone  (LOTRISONE ) cream apply to  affected area(s) twice daily 30 g 0   ezetimibe  (ZETIA ) 10 MG tablet Take 10 mg by mouth daily.     fexofenadine (ALLEGRA) 180 MG tablet Take 180 mg by mouth daily.     pantoprazole  (PROTONIX ) 40 MG tablet TAKE ONE TABLET BY MOUTH DAILY. 90 tablet 1    tirzepatide (MOUNJARO) 10 MG/0.5ML Pen Inject 10 mg into the skin once a week.     bisoprolol-hydrochlorothiazide  (ZIAC) 5-6.25 MG tablet Take 1 tablet by mouth daily. (Patient not taking: Reported on 08/18/2024)     diclofenac  sodium (VOLTAREN ) 1 % GEL Apply 4 g topically 4 (four) times daily. (Patient not taking: Reported on 04/08/2020) 1 Tube 3   docusate sodium  (COLACE) 100 MG capsule Take 1 capsule (100 mg total) by mouth 2 (two) times daily. To keep stools soft (Patient not taking: Reported on 12/11/2022) 30 capsule 0   doxycycline  (VIBRA -TABS) 100 MG tablet Take one tab po BID x 10 days. Take with food. (Patient not taking: Reported on 12/11/2022) 20 tablet 0   EPINEPHrine  0.3 mg/0.3 mL IJ SOAJ injection Inject 0.3 mg into the skin once. (Patient not taking: Reported on 12/11/2022)     ferrous sulfate  325 (65 FE) MG tablet Take 650 mg by mouth daily with breakfast. (Patient not taking: Reported on 08/18/2024)     fluorouracil  (EFUDEX ) 5 % cream Apply topically 2 (two) times daily. Bid to left forehead for 7 days 30 g 0   fluticasone  (FLONASE ) 50 MCG/ACT nasal spray Use 2 sprays in each nostril once daily as needed (Patient not taking: Reported on 08/18/2024) 16 g 5   fluticasone  (FLOVENT  HFA) 110 MCG/ACT inhaler INHALE 1 PUFF INTO THE LUNGS TWICE DAILY (Patient not taking: Reported on 08/18/2024) 12 each 2   gabapentin  (NEURONTIN ) 800 MG tablet Take 1 tablet (800 mg total) by mouth at bedtime for 14 days. Take nightly for 3 days, then up to 14 days as needed (Patient not taking: Reported on 08/18/2024) 14 tablet 0   glucose blood test strip Use as instructed with One Touch Delica Glucometer.  Dx: E11.9 100 each 4   Lancets (ONETOUCH ULTRASOFT) lancets 1 each by Other route 2 (two) times daily. Dispense one touch nano lancets 150 each 4   losartan  (COZAAR ) 100 MG tablet TAKE ONE TABLET BY MOUTH DAILY (Patient not taking: Reported on 08/18/2024) 90 tablet 0   metFORMIN  (GLUCOPHAGE ) 500 MG tablet TAKE ONE  TABLET BY MOUTH TWICE A DAY WITH MEALS (Patient not taking: Reported on 08/18/2024) 180 tablet 0   nebivolol  (BYSTOLIC ) 5 MG tablet TAKE 1 TABLET(5 MG) BY MOUTH DAILY as needed (Patient not taking: Reported on 08/18/2024) 90 tablet 3   oxyCODONE  (OXY IR/ROXICODONE ) 5 MG immediate release tablet Take 1 tablet (5 mg total) by mouth every 4 (four) hours as needed for severe pain. (Patient not taking: Reported on 08/18/2024) 20 tablet 0   TRULICITY  0.75 MG/0.5ML SOPN INJECT 0.5 MILLILITERS UNDER THE SKIN ONCE A WEEK (Patient not taking: Reported on 08/18/2024) 3 pen 3   UNABLE TO FIND One Touch Ultra Mini Test strips for patient to use with home machine. Patient is testing blood sugars at home 2 times daily. Dx code 250.00 100 each 6   No current facility-administered medications for this visit.     Allergies:   Ace inhibitors, Ciprofloxacin , Codeine, and Onglyza Darbie.[place] ]   Social History:  The patient  reports that she has never smoked. She has never used smokeless tobacco. She reports current  alcohol use. She reports that she does not use drugs.   Family History:   family history includes Coronary artery disease in her father; Diabetes in her father, maternal grandfather, and paternal grandfather; Heart disease in her maternal grandmother; Hyperlipidemia in her mother; Stroke in her paternal grandmother.    Review of Systems: Review of Systems  Constitutional: Negative.   HENT: Negative.    Respiratory: Negative.    Cardiovascular: Negative.   Gastrointestinal: Negative.   Musculoskeletal: Negative.   Neurological: Negative.   Psychiatric/Behavioral: Negative.    All other systems reviewed and are negative.   PHYSICAL EXAM: VS:  BP 118/66 Comment: right arm  Pulse 74   Ht 5' 2 (1.575 m)   Wt 138 lb 9.6 oz (62.9 kg)   SpO2 97%   BMI 25.35 kg/m  , BMI Body mass index is 25.35 kg/m. GEN: Well nourished, well developed, in no acute distress HEENT: normal Neck: no JVD, carotid  bruits, or masses Cardiac: RRR; no murmurs, rubs, or gallops,no edema  Respiratory:  clear to auscultation bilaterally, normal work of breathing GI: soft, nontender, nondistended, + BS MS: no deformity or atrophy Skin: warm and dry, no rash Neuro:  Strength and sensation are intact Psych: euthymic mood, full affect  Recent Labs: No results found for requested labs within last 365 days.   Lipid Panel Lab Results  Component Value Date   CHOL 220 (H) 08/28/2018   HDL 38.90 (L) 08/28/2018   LDLCALC 147 (H) 08/28/2018   TRIG 170.0 (H) 08/28/2018      Wt Readings from Last 3 Encounters:  08/18/24 138 lb 9.6 oz (62.9 kg)  04/08/20 195 lb 1.7 oz (88.5 kg)  03/29/20 195 lb (88.5 kg)    ASSESSMENT AND PLAN:  Problem List Items Addressed This Visit       Cardiology Problems   Hypertension   Relevant Medications   amLODipine-olmesartan (AZOR) 5-20 MG tablet   ezetimibe  (ZETIA ) 10 MG tablet   Other Relevant Orders   EKG 12-Lead (Completed)   Hyperlipidemia   Relevant Medications   amLODipine-olmesartan (AZOR) 5-20 MG tablet   ezetimibe  (ZETIA ) 10 MG tablet   Other Visit Diagnoses       Chest pain of uncertain etiology    -  Primary     Type 2 diabetes mellitus with other specified complication, without long-term current use of insulin (HCC)       Relevant Medications   tirzepatide (MOUNJARO) 10 MG/0.5ML Pen   amLODipine-olmesartan (AZOR) 5-20 MG tablet      Left arm pain Atypical in nature, less likely cardiac etiology, followed by orthopedics Recommending NSAIDs, icing, rest  Coronary calcification Minimal increase in calcium  scoring up to 28, images pulled up and reviewed Small punctate lesion in the LAD and RCA Currently on Zetia , prefers not to add additional medications at this time No indication for stress testing  Hyperlipidemia On Zetia , prefers not to be on statin,  reports intolerance Discussed other options including bempedoic acid, PCSK9 inhibitor,  Leqvio She will hold off for now but contact us  later if she is interested  Essential hypertension Blood pressure is well controlled on today's visit. No changes made to the medications.  Dizziness Symptoms improved with meclizine, likely secondary to BPV.    Signed, Velinda Lunger, M.D., Ph.D. [facility] [place], [place] [phone]

## 2024-08-18 ENCOUNTER — Ambulatory Visit: Attending: Cardiovascular Disease | Admitting: Cardiovascular Disease

## 2024-08-18 ENCOUNTER — Encounter: Payer: Self-pay | Admitting: Cardiovascular Disease

## 2024-08-18 VITALS — BP 118/66 | HR 74 | Ht 62.0 in | Wt 138.6 lb

## 2024-08-18 DIAGNOSIS — E782 Mixed hyperlipidemia: Secondary | ICD-10-CM

## 2024-08-18 DIAGNOSIS — R079 Chest pain, unspecified: Secondary | ICD-10-CM | POA: Diagnosis not present

## 2024-08-18 DIAGNOSIS — I10 Essential (primary) hypertension: Secondary | ICD-10-CM

## 2024-08-18 DIAGNOSIS — E1169 Type 2 diabetes mellitus with other specified complication: Secondary | ICD-10-CM

## 2024-08-18 NOTE — Patient Instructions (Addendum)

## 2024-08-19 ENCOUNTER — Ambulatory Visit
Admission: RE | Admit: 2024-08-19 | Discharge: 2024-08-19 | Disposition: A | Discharge status: Home / Self Care | Source: Ambulatory Visit | Attending: Surgery | Admitting: Surgery

## 2024-08-19 DIAGNOSIS — M7641 Tibial collateral bursitis [Pellegrini-Stieda], right leg: Secondary | ICD-10-CM

## 2024-08-19 DIAGNOSIS — M25561 Pain in right knee: Secondary | ICD-10-CM
